# Patient Record
Sex: Female | Born: 1945 | Race: White | Hispanic: No | Marital: Married | Smoking: Never smoker
Health system: Southern US, Community
[De-identification: ages and names within clinical notes are randomized; demographics above are authoritative.]

## PROBLEM LIST (undated history)

## (undated) DIAGNOSIS — I10 Essential (primary) hypertension: Secondary | ICD-10-CM

## (undated) DIAGNOSIS — Z780 Asymptomatic menopausal state: Secondary | ICD-10-CM

## (undated) DIAGNOSIS — I2510 Atherosclerotic heart disease of native coronary artery without angina pectoris: Secondary | ICD-10-CM

## (undated) DIAGNOSIS — R7303 Prediabetes: Secondary | ICD-10-CM

## (undated) DIAGNOSIS — H35039 Hypertensive retinopathy, unspecified eye: Secondary | ICD-10-CM

## (undated) DIAGNOSIS — IMO0002 Reserved for concepts with insufficient information to code with codable children: Secondary | ICD-10-CM

## (undated) DIAGNOSIS — E782 Mixed hyperlipidemia: Secondary | ICD-10-CM

## (undated) DIAGNOSIS — E669 Obesity, unspecified: Secondary | ICD-10-CM

## (undated) DIAGNOSIS — J309 Allergic rhinitis, unspecified: Secondary | ICD-10-CM

## (undated) DIAGNOSIS — M47812 Spondylosis without myelopathy or radiculopathy, cervical region: Secondary | ICD-10-CM

## (undated) DIAGNOSIS — N3946 Mixed incontinence: Secondary | ICD-10-CM

## (undated) DIAGNOSIS — F0781 Postconcussional syndrome: Secondary | ICD-10-CM

## (undated) DIAGNOSIS — M5430 Sciatica, unspecified side: Secondary | ICD-10-CM

## (undated) HISTORY — DX: Obesity, unspecified: E66.9

## (undated) HISTORY — PX: TONSILLECTOMY: SUR1361

## (undated) HISTORY — PX: COLONOSCOPY: SHX174

## (undated) HISTORY — DX: Spondylosis without myelopathy or radiculopathy, cervical region: M47.812

## (undated) HISTORY — PX: BREAST BIOPSY: SHX20

## (undated) HISTORY — PX: BREAST IMPLANT EXCHANGE: SHX6296

## (undated) HISTORY — PX: CARDIAC CATHETERIZATION: SHX172

## (undated) HISTORY — DX: Hypertensive retinopathy, unspecified eye: H35.039

## (undated) HISTORY — DX: Asymptomatic menopausal state: Z78.0

## (undated) HISTORY — DX: Atherosclerotic heart disease of native coronary artery without angina pectoris: I25.10

## (undated) HISTORY — PX: ABDOMINAL HYSTERECTOMY: SHX81

## (undated) HISTORY — PX: TUBAL LIGATION: SHX77

## (undated) HISTORY — DX: Sciatica, unspecified side: M54.30

## (undated) HISTORY — DX: Mixed incontinence: N39.46

## (undated) HISTORY — PX: BREAST ENHANCEMENT SURGERY: SHX7

## (undated) HISTORY — DX: Essential (primary) hypertension: I10

## (undated) HISTORY — DX: Allergic rhinitis, unspecified: J30.9

## (undated) HISTORY — PX: REPLACEMENT TOTAL KNEE: SUR1224

## (undated) HISTORY — DX: Reserved for concepts with insufficient information to code with codable children: IMO0002

## (undated) HISTORY — PX: OTHER SURGICAL HISTORY: SHX169

## (undated) HISTORY — PX: CORONARY ARTERY BYPASS GRAFT: SHX141

## (undated) HISTORY — PX: JOINT REPLACEMENT: SHX530

## (undated) HISTORY — PX: PARTIAL HYSTERECTOMY: SHX80

## (undated) HISTORY — DX: Mixed hyperlipidemia: E78.2

## (undated) HISTORY — DX: Prediabetes: R73.03

---

## 2003-08-23 ENCOUNTER — Ambulatory Visit (HOSPITAL_COMMUNITY): Admission: RE | Admit: 2003-08-23 | Discharge: 2003-08-23 | Payer: Self-pay | Admitting: *Deleted

## 2008-02-20 ENCOUNTER — Inpatient Hospital Stay (HOSPITAL_COMMUNITY): Admission: RE | Admit: 2008-02-20 | Discharge: 2008-02-23 | Payer: Self-pay | Admitting: Orthopedic Surgery

## 2011-01-16 ENCOUNTER — Ambulatory Visit (HOSPITAL_COMMUNITY)
Admission: RE | Admit: 2011-01-16 | Discharge: 2011-01-16 | Disposition: A | Discharge status: Home / Self Care | Payer: BC Managed Care – PPO | Source: Ambulatory Visit | Attending: Cardiology | Admitting: Cardiology

## 2011-01-16 DIAGNOSIS — I2510 Atherosclerotic heart disease of native coronary artery without angina pectoris: Secondary | ICD-10-CM | POA: Insufficient documentation

## 2011-01-16 DIAGNOSIS — E785 Hyperlipidemia, unspecified: Secondary | ICD-10-CM | POA: Insufficient documentation

## 2011-01-16 DIAGNOSIS — I10 Essential (primary) hypertension: Secondary | ICD-10-CM | POA: Insufficient documentation

## 2011-01-16 DIAGNOSIS — I4891 Unspecified atrial fibrillation: Secondary | ICD-10-CM | POA: Insufficient documentation

## 2011-01-18 NOTE — Procedures (Signed)
NAMETINISHA, ETZKORN             ACCOUNT NO.:  [id number]  MEDICAL RECORD NO.:  [id number]           PATIENT TYPE:  I  LOCATION:  6522                         FACILITY:  [facility]  PHYSICIAN:  Jake Bathe, MD      DATE OF BIRTH:  Jan 18, 1946  DATE OF PROCEDURE:  01/16/2011 DATE OF DISCHARGE:  01/16/2011                           CARDIAC CATHETERIZATION   PROCEDURES: 1. Left heart catheterization via the right radial artery approach. 2. Selective coronary angiography. 3. Left ventriculogram.  INDICATIONS:  A 65 year old female with hypertension, hyperlipidemia whose mother and husband are patients of mine, who was evaluated for chest discomfort on January 12, 2011.  It is left sided, noted when walking up hill to [facility] for instance.  She has also been in an exercise class where she has noted left-sided heaviness and discomfort, relieved with rest.  A nuclear stress test was originally ordered, however, this was denied by her insurance company and therefore an exercise treadmill test was performed.  She exercised for 4 minutes and 2 seconds and her blood pressure responded appropriately from 142 to 182 systolic.  She did, however, experience moderate 6/10 chest discomfort, similar to her previous symptoms, developed frequent PVCs in a bigeminal pattern and downsloping ST-segment depression approximately 2 mm in most leads, most notably in III, II, and V5 and V6.  During rest, the symptoms resolved.  She was then taken to the cardiac catheterization lab for further evaluation.  PROCEDURE DETAILS:  Informed consent was obtained.  Risk of stroke, heart attack, death, renal impairment, arterial damage, bleeding were explained to the patient at length.  A 1% lidocaine was used for local anesthesia, 3 mg of Versed and 75 mcg of fentanyl were used for conscious sedation.  A 5-French hydrophilic sheath was inserted in to the right radial artery without difficulty.  A  Judkins right #4 catheter and Judkins left 3.5 catheter were used to selectively cannulate the coronary arteries and multiple views with hand injection of Omnipaque were obtained.  An angled pigtail was used across the left ventricle and the left ventriculogram in the RAO position was performed with power injection of 30 mL of contrast.  Following procedure, the sheath was removed and a Terumo T band was used for hemostasis.  Allen test was normal pre and post procedure.  A 3 mg of verapamil was administered after sheath insertion as well as 4000 units of heparin.  FINDINGS: 1. Left main artery - widely patent, branching into the circumflex as     well as LAD artery. 2. Left anterior descending artery.  At the bifurcation of the first     septal branch, there is a long tubular stenosis of up to 90% and     the vessel remains moderately diseased throughout the mid segment.     After the second diagonal branch, the vessel was without any     significant disease.  There is also a branching diagonal at the     level of this first septal branch which has a 99%/subtotal     occlusion with slow distal filling of this segment. 3. Circumflex  artery.  This artery is diffusely diseased with 1 high     obtuse marginal branch that is quite small in caliber.  The mid AV     groove circ has multiple lesions of 70% identified.  There is one     significant distal obtuse marginal branch which is widely patent. 4. Right coronary artery.  This is the dominant vessel giving rise to     the posterior descending artery.  The posterior descending artery     has approximately 90% ostial stenosis.  There is also 30-40%     proximal RCA stenosis at the site of the first angle of the artery. 5. Left ventriculogram.  Ejection fraction normal.  No evidence of     wall motion abnormality.  EF of 65%.  Left ventricular pressure is     100 with an end-diastolic pressure of 12.  Aortic pressure 100/56     with a mean  of 74 mmHg.  No significant gradient identified.  IMPRESSION: 1. Severe 3-vessel coronary artery disease as described above, most     notably 90% ostial posterior descending coronary artery of the     right coronary artery system, 90% mid left anterior descending     coronary artery, 99% first diagonal, 60-70% diffuse mid circumflex     disease. 2. Normal left ventricular ejection fraction of 65% with no wall     motion abnormality. 3. No evidence of aortic stenosis or significant mitral regurgitation     from left ventriculogram. 4. Newly discovered atrial fibrillation, well rate controlled.  Prior     EKG was normal sinus rhythm.  PLAN:  Findings have been discussed with the patient and referral has been made to [facility] for bypass surgery.  She is currently comfortable without any evidence of chest discomfort or angina and I feel fine letting her be discharged as an outpatient and to have close followup. On Monday, I have scheduled an echocardiogram to be performed at 2:15 p.m.  This will be done in my office.  At this point with her atrial fibrillation, she is a nondiabetic, no prior strokes, and 65 years old. I do feel comfortable at this point to continue with her aspirin therapy.  This will have to be noted, however, peri-procedurally and perhaps maze procedure can be performed concomitantly with bypass.     Jake Bathe, MD     MCS/MEDQ  D:  01/16/2011  T:  01/17/2011  Job:  [id number]  cc:   Joycelyn Rua, M.D.  Electronically Signed by Donato Schultz MD on 01/17/2011 02:29:25 PM

## 2011-01-20 ENCOUNTER — Encounter (INDEPENDENT_AMBULATORY_CARE_PROVIDER_SITE_OTHER): Payer: BC Managed Care – PPO | Admitting: Thoracic Surgery (Cardiothoracic Vascular Surgery)

## 2011-01-20 DIAGNOSIS — I2510 Atherosclerotic heart disease of native coronary artery without angina pectoris: Secondary | ICD-10-CM

## 2011-01-23 ENCOUNTER — Other Ambulatory Visit: Payer: Self-pay | Admitting: Thoracic Surgery (Cardiothoracic Vascular Surgery)

## 2011-01-23 ENCOUNTER — Ambulatory Visit (HOSPITAL_COMMUNITY): Payer: BC Managed Care – PPO | Attending: Thoracic Surgery (Cardiothoracic Vascular Surgery)

## 2011-01-23 ENCOUNTER — Ambulatory Visit (HOSPITAL_COMMUNITY)
Admission: RE | Admit: 2011-01-23 | Discharge: 2011-01-23 | Disposition: A | Discharge status: Home / Self Care | Payer: BC Managed Care – PPO | Source: Ambulatory Visit | Attending: Thoracic Surgery (Cardiothoracic Vascular Surgery) | Admitting: Thoracic Surgery (Cardiothoracic Vascular Surgery)

## 2011-01-23 ENCOUNTER — Encounter (HOSPITAL_COMMUNITY)
Admission: RE | Admit: 2011-01-23 | Discharge: 2011-01-23 | Disposition: A | Discharge status: Home / Self Care | Payer: BC Managed Care – PPO | Source: Ambulatory Visit | Attending: Pediatrics | Admitting: Pediatrics

## 2011-01-23 DIAGNOSIS — Z01818 Encounter for other preprocedural examination: Secondary | ICD-10-CM | POA: Insufficient documentation

## 2011-01-23 DIAGNOSIS — I2510 Atherosclerotic heart disease of native coronary artery without angina pectoris: Secondary | ICD-10-CM

## 2011-01-23 DIAGNOSIS — Z01811 Encounter for preprocedural respiratory examination: Secondary | ICD-10-CM

## 2011-01-23 DIAGNOSIS — I10 Essential (primary) hypertension: Secondary | ICD-10-CM | POA: Insufficient documentation

## 2011-01-23 DIAGNOSIS — Z01810 Encounter for preprocedural cardiovascular examination: Secondary | ICD-10-CM

## 2011-01-26 ENCOUNTER — Inpatient Hospital Stay (HOSPITAL_COMMUNITY): Admission: RE | Admit: 2011-01-26 | Payer: BC Managed Care – PPO | Source: Ambulatory Visit

## 2011-01-27 ENCOUNTER — Inpatient Hospital Stay (HOSPITAL_COMMUNITY)
Admission: RE | Admit: 2011-01-27 | Discharge: 2011-01-31 | DRG: 109 | Disposition: A | Discharge status: Home / Self Care | Payer: BC Managed Care – PPO | Source: Ambulatory Visit | Attending: Thoracic Surgery (Cardiothoracic Vascular Surgery) | Admitting: Thoracic Surgery (Cardiothoracic Vascular Surgery)

## 2011-01-27 ENCOUNTER — Other Ambulatory Visit: Payer: Self-pay | Admitting: Thoracic Surgery (Cardiothoracic Vascular Surgery)

## 2011-01-27 ENCOUNTER — Inpatient Hospital Stay (HOSPITAL_COMMUNITY): Payer: BC Managed Care – PPO

## 2011-01-27 DIAGNOSIS — E119 Type 2 diabetes mellitus without complications: Secondary | ICD-10-CM | POA: Diagnosis present

## 2011-01-27 DIAGNOSIS — I2510 Atherosclerotic heart disease of native coronary artery without angina pectoris: Secondary | ICD-10-CM

## 2011-01-27 DIAGNOSIS — E785 Hyperlipidemia, unspecified: Secondary | ICD-10-CM | POA: Diagnosis present

## 2011-01-27 DIAGNOSIS — Z7982 Long term (current) use of aspirin: Secondary | ICD-10-CM

## 2011-01-27 DIAGNOSIS — E876 Hypokalemia: Secondary | ICD-10-CM | POA: Diagnosis not present

## 2011-01-27 DIAGNOSIS — I10 Essential (primary) hypertension: Secondary | ICD-10-CM | POA: Diagnosis present

## 2011-01-27 DIAGNOSIS — Z96659 Presence of unspecified artificial knee joint: Secondary | ICD-10-CM

## 2011-01-27 DIAGNOSIS — Z7902 Long term (current) use of antithrombotics/antiplatelets: Secondary | ICD-10-CM

## 2011-01-27 DIAGNOSIS — E669 Obesity, unspecified: Secondary | ICD-10-CM | POA: Diagnosis present

## 2011-01-27 DIAGNOSIS — I059 Rheumatic mitral valve disease, unspecified: Secondary | ICD-10-CM | POA: Diagnosis present

## 2011-01-27 DIAGNOSIS — D62 Acute posthemorrhagic anemia: Secondary | ICD-10-CM | POA: Diagnosis not present

## 2011-01-27 LAB — POCT I-STAT 3, ART BLOOD GAS (G3+)
Bicarbonate: 27.9 mEq/L — ABNORMAL HIGH (ref 20.0–24.0)
Bicarbonate: 24.6 mEq/L — ABNORMAL HIGH (ref 20.0–24.0)
Bicarbonate: 26.7 mEq/L — ABNORMAL HIGH (ref 20.0–24.0)
Bicarbonate: 26.3 mEq/L — ABNORMAL HIGH (ref 20.0–24.0)
O2 Saturation: 98 %
Patient temperature: 36.2
TCO2: 29 mmol/L (ref 0–100)
TCO2: 26 mmol/L (ref 0–100)
TCO2: 28 mmol/L (ref 0–100)
TCO2: 28 mmol/L (ref 0–100)
pCO2 arterial: 41.3 mmHg (ref 35.0–45.0)
pCO2 arterial: 43.2 mmHg (ref 35.0–45.0)
pCO2 arterial: 49.7 mmHg — ABNORMAL HIGH (ref 35.0–45.0)
pH, Arterial: 7.438 — ABNORMAL HIGH (ref 7.350–7.400)
pH, Arterial: 7.308 — ABNORMAL LOW (ref 7.350–7.400)
pH, Arterial: 7.331 — ABNORMAL LOW (ref 7.350–7.400)
pO2, Arterial: 87 mmHg (ref 80.0–100.0)
pO2, Arterial: 116 mmHg — ABNORMAL HIGH (ref 80.0–100.0)

## 2011-01-27 LAB — POCT I-STAT 4, (NA,K, GLUC, HGB,HCT)
Glucose, Bld: 133 mg/dL — ABNORMAL HIGH (ref 70–99)
Glucose, Bld: 120 mg/dL — ABNORMAL HIGH (ref 70–99)
Glucose, Bld: 127 mg/dL — ABNORMAL HIGH (ref 70–99)
Glucose, Bld: 116 mg/dL — ABNORMAL HIGH (ref 70–99)
HCT: 37 % (ref 36.0–46.0)
HCT: 28 % — ABNORMAL LOW (ref 36.0–46.0)
HCT: 40 % (ref 36.0–46.0)
Hemoglobin: 9.2 g/dL — ABNORMAL LOW (ref 12.0–15.0)
Hemoglobin: 9.5 g/dL — ABNORMAL LOW (ref 12.0–15.0)
Hemoglobin: 13.6 g/dL (ref 12.0–15.0)
Potassium: 3.2 mEq/L — ABNORMAL LOW (ref 3.5–5.1)
Sodium: 139 mEq/L (ref 135–145)
Sodium: 135 mEq/L (ref 135–145)
Sodium: 139 mEq/L (ref 135–145)

## 2011-01-27 LAB — POCT I-STAT, CHEM 8
BUN: 9 mg/dL (ref 6–23)
Creatinine, Ser: 0.8 mg/dL (ref 0.4–1.2)
Glucose, Bld: 147 mg/dL — ABNORMAL HIGH (ref 70–99)
Hemoglobin: 13.6 g/dL (ref 12.0–15.0)
Potassium: 3.8 mEq/L (ref 3.5–5.1)
Sodium: 141 mEq/L (ref 135–145)

## 2011-01-27 LAB — CBC
HCT: 39.1 % (ref 36.0–46.0)
Hemoglobin: 13.6 g/dL (ref 12.0–15.0)
MCH: 31.5 pg (ref 26.0–34.0)
MCHC: 34.5 g/dL (ref 30.0–36.0)
MCHC: 34.5 g/dL (ref 30.0–36.0)
MCV: 91.2 fL (ref 78.0–100.0)
Platelets: 157 10*3/uL (ref 150–400)
RBC: 4.32 MIL/uL (ref 3.87–5.11)
RDW: 12.5 % (ref 11.5–15.5)
WBC: 20.9 10*3/uL — ABNORMAL HIGH (ref 4.0–10.5)

## 2011-01-27 LAB — PLATELET COUNT: Platelets: 161 10*3/uL (ref 150–400)

## 2011-01-27 LAB — CREATININE, SERUM: GFR calc Af Amer: >60 mL/min (ref >60)

## 2011-01-27 LAB — HEMOGLOBIN AND HEMATOCRIT, BLOOD
HCT: 28.2 % — ABNORMAL LOW (ref 36.0–46.0)
Hemoglobin: 9.8 g/dL — ABNORMAL LOW (ref 12.0–15.0)

## 2011-01-27 LAB — POCT I-STAT GLUCOSE
Glucose, Bld: 127 mg/dL — ABNORMAL HIGH (ref 70–99)
Operator id: 190282

## 2011-01-27 LAB — PROTIME-INR
INR: 1.39 (ref 0.00–1.49)
Prothrombin Time: 17.3 seconds — ABNORMAL HIGH (ref 11.6–15.2)

## 2011-01-27 LAB — APTT: aPTT: 28 seconds (ref 24–37)

## 2011-01-27 LAB — MAGNESIUM: Magnesium: 3 mg/dL — ABNORMAL HIGH (ref 1.5–2.5)

## 2011-01-28 ENCOUNTER — Inpatient Hospital Stay (HOSPITAL_COMMUNITY): Payer: BC Managed Care – PPO

## 2011-01-28 LAB — URINALYSIS, ROUTINE W REFLEX MICROSCOPIC
Bilirubin Urine: NEGATIVE
Ketones, ur: NEGATIVE mg/dL
Nitrite: NEGATIVE
Urobilinogen, UA: 0.2 mg/dL (ref 0.0–1.0)

## 2011-01-28 LAB — POCT I-STAT, CHEM 8
BUN: 11 mg/dL (ref 6–23)
Chloride: 98 mEq/L (ref 96–112)
Creatinine, Ser: 0.9 mg/dL (ref 0.4–1.2)
Glucose, Bld: 161 mg/dL — ABNORMAL HIGH (ref 70–99)
Potassium: 3 mEq/L — ABNORMAL LOW (ref 3.5–5.1)
Sodium: 140 mEq/L (ref 135–145)

## 2011-01-28 LAB — BLOOD GAS, ARTERIAL
Acid-Base Excess: 4.8 mmol/L — ABNORMAL HIGH (ref 0.0–2.0)
O2 Saturation: 98.7 %
TCO2: 30.1 mmol/L (ref 0–100)
pCO2 arterial: 42.7 mmHg (ref 35.0–45.0)
pO2, Arterial: 109 mmHg — ABNORMAL HIGH (ref 80.0–100.0)

## 2011-01-28 LAB — BASIC METABOLIC PANEL
Chloride: 104 mEq/L (ref 96–112)
Creatinine, Ser: 0.77 mg/dL (ref 0.4–1.2)
GFR calc Af Amer: >60 mL/min (ref >60)
Sodium: 142 mEq/L (ref 135–145)

## 2011-01-28 LAB — CREATININE, SERUM
Creatinine, Ser: 0.82 mg/dL (ref 0.4–1.2)
GFR calc Af Amer: >60 mL/min (ref >60)
GFR calc non Af Amer: >60 mL/min (ref >60)

## 2011-01-28 LAB — COMPREHENSIVE METABOLIC PANEL
ALT: 31 U/L (ref 0–35)
AST: 27 U/L (ref 0–37)
Albumin: 4.2 g/dL (ref 3.5–5.2)
Alkaline Phosphatase: 39 U/L (ref 39–117)
Calcium: 10.1 mg/dL (ref 8.4–10.5)
GFR calc Af Amer: >60 mL/min (ref >60)
Potassium: 3.9 mEq/L (ref 3.5–5.1)
Sodium: 142 mEq/L (ref 135–145)
Total Protein: 7.5 g/dL (ref 6.0–8.3)

## 2011-01-28 LAB — GLUCOSE, CAPILLARY
Glucose-Capillary: 106 mg/dL — ABNORMAL HIGH (ref 70–99)
Glucose-Capillary: 115 mg/dL — ABNORMAL HIGH (ref 70–99)
Glucose-Capillary: 123 mg/dL — ABNORMAL HIGH (ref 70–99)
Glucose-Capillary: 123 mg/dL — ABNORMAL HIGH (ref 70–99)
Glucose-Capillary: 127 mg/dL — ABNORMAL HIGH (ref 70–99)
Glucose-Capillary: 151 mg/dL — ABNORMAL HIGH (ref 70–99)
Glucose-Capillary: 164 mg/dL — ABNORMAL HIGH (ref 70–99)
Glucose-Capillary: 99 mg/dL (ref 70–99)
Glucose-Capillary: 146 mg/dL — ABNORMAL HIGH (ref 70–99)
Glucose-Capillary: 145 mg/dL — ABNORMAL HIGH (ref 70–99)
Glucose-Capillary: 179 mg/dL — ABNORMAL HIGH (ref 70–99)

## 2011-01-28 LAB — CBC
HCT: 33 % — ABNORMAL LOW (ref 36.0–46.0)
Hemoglobin: 16.1 g/dL — ABNORMAL HIGH (ref 12.0–15.0)
Hemoglobin: 12.7 g/dL (ref 12.0–15.0)
Hemoglobin: 11.2 g/dL — ABNORMAL LOW (ref 12.0–15.0)
MCH: 31.8 pg (ref 26.0–34.0)
MCH: 31.8 pg (ref 26.0–34.0)
MCH: 31.3 pg (ref 26.0–34.0)
MCHC: 33.9 g/dL (ref 30.0–36.0)
MCV: 92.9 fL (ref 78.0–100.0)
MCV: 92.2 fL (ref 78.0–100.0)
Platelets: 169 10*3/uL (ref 150–400)
RBC: 5.07 MIL/uL (ref 3.87–5.11)
RBC: 3.99 MIL/uL (ref 3.87–5.11)
RBC: 3.58 MIL/uL — ABNORMAL LOW (ref 3.87–5.11)
WBC: 18.7 10*3/uL — ABNORMAL HIGH (ref 4.0–10.5)

## 2011-01-28 LAB — PROTIME-INR: INR: 0.93 (ref 0.00–1.49)

## 2011-01-28 LAB — MAGNESIUM: Magnesium: 2.1 mg/dL (ref 1.5–2.5)

## 2011-01-28 LAB — APTT: aPTT: 30 seconds (ref 24–37)

## 2011-01-29 ENCOUNTER — Inpatient Hospital Stay (HOSPITAL_COMMUNITY): Payer: BC Managed Care – PPO

## 2011-01-29 LAB — BASIC METABOLIC PANEL
CO2: 30 mEq/L (ref 19–32)
Calcium: 8.5 mg/dL (ref 8.4–10.5)
GFR calc Af Amer: >60 mL/min (ref >60)
Glucose, Bld: 105 mg/dL — ABNORMAL HIGH (ref 70–99)
Potassium: 3.3 mEq/L — ABNORMAL LOW (ref 3.5–5.1)
Sodium: 139 mEq/L (ref 135–145)

## 2011-01-29 LAB — CBC
HCT: 31.1 % — ABNORMAL LOW (ref 36.0–46.0)
Hemoglobin: 10.5 g/dL — ABNORMAL LOW (ref 12.0–15.0)
MCHC: 33.8 g/dL (ref 30.0–36.0)
RBC: 3.35 MIL/uL — ABNORMAL LOW (ref 3.87–5.11)
WBC: 14.1 10*3/uL — ABNORMAL HIGH (ref 4.0–10.5)

## 2011-01-29 LAB — GLUCOSE, CAPILLARY
Glucose-Capillary: 120 mg/dL — ABNORMAL HIGH (ref 70–99)
Glucose-Capillary: 145 mg/dL — ABNORMAL HIGH (ref 70–99)
Glucose-Capillary: 140 mg/dL — ABNORMAL HIGH (ref 70–99)
Glucose-Capillary: 126 mg/dL — ABNORMAL HIGH (ref 70–99)

## 2011-01-30 ENCOUNTER — Inpatient Hospital Stay (HOSPITAL_COMMUNITY): Payer: BC Managed Care – PPO

## 2011-01-30 LAB — GLUCOSE, CAPILLARY
Glucose-Capillary: 110 mg/dL — ABNORMAL HIGH (ref 70–99)
Glucose-Capillary: 116 mg/dL — ABNORMAL HIGH (ref 70–99)

## 2011-01-30 LAB — CBC
HCT: 32.4 % — ABNORMAL LOW (ref 36.0–46.0)
MCHC: 33 g/dL (ref 30.0–36.0)
MCV: 93.6 fL (ref 78.0–100.0)
Platelets: 137 10*3/uL — ABNORMAL LOW (ref 150–400)
RDW: 13.3 % (ref 11.5–15.5)

## 2011-01-30 LAB — BASIC METABOLIC PANEL
BUN: 15 mg/dL (ref 6–23)
Chloride: 98 mEq/L (ref 96–112)
Creatinine, Ser: 0.89 mg/dL (ref 0.4–1.2)
Glucose, Bld: 93 mg/dL (ref 70–99)
Potassium: 3.6 mEq/L (ref 3.5–5.1)

## 2011-01-31 LAB — GLUCOSE, CAPILLARY

## 2011-02-06 ENCOUNTER — Encounter (INDEPENDENT_AMBULATORY_CARE_PROVIDER_SITE_OTHER): Payer: Self-pay

## 2011-02-06 DIAGNOSIS — I2510 Atherosclerotic heart disease of native coronary artery without angina pectoris: Secondary | ICD-10-CM

## 2011-02-06 DIAGNOSIS — I059 Rheumatic mitral valve disease, unspecified: Secondary | ICD-10-CM

## 2011-02-10 NOTE — H&P (Addendum)
[name], [name]             ACCOUNT NO.:  [id number]  MEDICAL RECORD NO.:  [id number]           PATIENT TYPE:  I  LOCATION:  6522                         FACILITY:  [facility]  PHYSICIAN:  Salvatore Decent. [name], M.D.DATE OF BIRTH:  September 25, 1946  DATE OF ADMISSION:  01/16/2011 DATE OF DISCHARGE:  01/16/2011                             HISTORY & PHYSICAL   REASON FOR CONSULTATION:  Three-vessel disease with exertional angina.  HISTORY OF PRESENT ILLNESS:  [name] is a 65 year old woman with a history of hypertension and hyperlipidemia, but no prior cardiac history.  About a month ago, she was taking an exercise class and noticed onset of chest pain.  She thought at first it might be a pulled muscle, it got better with rest.  However, then shortly thereafter, she was visiting her mother in the hospital, while she was long in walking up an incline, she experienced the same discomfort.  Since then, she has continued to have exertional discomfort relieved with rest.  She is not aware of any nausea, diaphoresis, or shortness of breath in association with this.  She also has not had any syncope or presyncopal spells.  She saw [name] and EKG showed some nonspecific ST changes and some PVCs. An exercise treadmill test showed ST depression 2 mm associated with chest discomfort.  She was referred to [name] and underwent cardiac catheterization which revealed severe three-vessel coronary disease with preserved left ventricular function.  She subsequently had an echocardiogram, the official report of which is still pending; but according to [name] showed mild-to-moderate mitral regurgitation. Of note, she also had an episode of atrial fibrillation which was short- lived and apparently was noted around the time of her stress test.  She denies any rest or nocturnal symptoms.  She does not have any previous history of atrial fibrillation, but says she will feel her heart flutter for  a few seconds every few months.  PAST MEDICAL HISTORY:  Significant for hypertension, mixed hyperlipidemia, diabetes, heart murmur, cervical spondylosis with bulging disks, obesity.  PAST SURGICAL HISTORY:  Extensive as noted in the patient's chart.  Of note, she has had bilateral knee surgeries including a right knee replacement, also bilateral breast implants.  MEDICATIONS:  Atenolol-Chlorthal 50/25 one tablet daily, Klor-Con 20 mEq daily, simvastatin 20 mg nightly, aspirin 81 mg daily, vitamin C, fish oil, calcium with vitamin D, chromium, and Biotin.  She has allergies to PENICILLIN causes a rash; SULFA causes a rash, NORVASC causes edema, and LISINOPRIL causes a cough.  She also has a LATEX allergy which causes a rash.  FAMILY HISTORY:  Significant for father dying at age 35 from heart disease.  Mother had a stent at age 32.  SOCIAL HISTORY:  She is married.  She works as a [employer] operational officer.  She has two children.  She does not smoke, occasionally uses alcohol.  REVIEW OF SYSTEMS:  Heart murmur, atrial fibrillation, chest pain as previously noted, although she occasionally has a rash, which she will use clotrimazole or betamethasone cream.  All other systems are negative.  PHYSICAL EXAMINATION:  GENERAL:  [name] is a well-appearing 65 year old  female in no acute distress. VITAL SIGNS:  Blood pressure is 117/78, pulse 68, respirations 14, oxygen saturation is 95% on room air. NEUROLOGICALLY:  She is alert, oriented x3 with no focal deficits.  She is quite anxious. HEENT:  Unremarkable. NECK:  Supple without thyromegaly, adenopathy, or bruits. CARDIAC:  Regular rate and rhythm.  Normal S1, S2.  There is a 2/6 systolic murmur. LUNGS:  Clear. ABDOMEN:  Soft, nontender. EXTREMITIES:  Without clubbing, cyanosis, or edema.  She has 2+ pulses throughout.  LABORATORY DATA:  Cardiac catheterization and echocardiogram as noted.  IMPRESSION:  [name] is a 65 year old woman  with exertional chest pain.  She has been found to have severe three-vessel coronary disease including proximal LAD disease, coronary bypass grafting as indicated for survival benefit and relief of symptoms in this setting.  I discussed in detail with her the nature of the operation including incisions to be used, expected hospital stay, overall recovery.  We did discuss relative pros and cons of on versus off-pump grafting.  I do think given her need for 4 grafts and relatively small target vessels that on-pump grafting would be preferable in her case.  I also discussed with her the possible need for mitral valve repair as well as a possibility of doing a maze procedure.  She has only had one episode of atrial fibrillation, so I would not subject her to a maze unless she needed something done with her mitral valve.  We will plan to assess the mitral valve intraoperatively with a transesophageal echocardiogram.  If that shows pathology which warrants opening the atrium to repair the valve, then we would go ahead and proceed with the maze.  If not, then given that she has only had one transient episode, we would not do the maze at that point.  She understands the risks of surgery include but not limited to death, stroke, MI, DVT, PE, bleeding, possible need for transfusion, infection as well as other organ system, dysfunction including respiratory, renal, hepatic, or GI complications.  She understands and accepts these risks and agrees to proceed.  All the patient's questions were answered.  If she has any further questions, she will plan to call.  We are going to proceed with surgery on Tuesday, February 21.     Salvatore Decent [name], M.D.     SCH/MEDQ  D:  01/20/2011  T:  01/21/2011  Job:  [id number]  cc:   [name], M.D.  Electronically Signed by [name] M.D. on 02/10/2011 12:21:09 PM Electronically Signed by [name] M.D. on 02/10/2011 12:29:14  PM Electronically Signed by [name] M.D. on 02/10/2011 12:46:29 PM Electronically Signed by [name] M.D. on 02/10/2011 01:04:31 PM Electronically Signed by [name] M.D. on 02/10/2011 01:24:48 PM Electronically Signed by [name] M.D. on 02/10/2011 01:46:17 PM Electronically Signed by [name] M.D. on 02/10/2011 02:06:05 PM Electronically Signed by [name] M.D. on 02/10/2011 02:26:30 PM Electronically Signed by [name] M.D. on 02/10/2011 02:49:26 PM Electronically Signed by [name] M.D. on 02/10/2011 03:13:31 PM Electronically Signed by [name] M.D. on 02/10/2011 03:38:10 PM Electronically Signed by [name] M.D. on 02/10/2011 04:13:50 PM Electronically Signed by [name] M.D. on 02/10/2011 04:45:08 PM Electronically Signed by [name] M.D. on 02/10/2011 05:18:23 PM Electronically Signed by [name] M.D. on 02/10/2011 05:18:23 PM Electronically Signed by [name] M.D. on 02/10/2011 07:32:31 PM

## 2011-02-10 NOTE — Discharge Summary (Addendum)
[name], [name]             ACCOUNT NO.:  [id number]  MEDICAL RECORD NO.:  [id number]           PATIENT TYPE:  LOCATION:                                 FACILITY:  PHYSICIAN:  Salvatore Decent. [name], M.D.DATE OF BIRTH:  1946-06-22  DATE OF ADMISSION: DATE OF DISCHARGE:                              DISCHARGE SUMMARY   HISTORY:  The patient is a 65 year old female with a history of hypertension and hyperlipidemia, but no prior cardiac history. Approximately 1 month prior to presentation, she was taking an exercise class and noticed an onset of chest pain.  She thought at first she might have pulled a muscle, but it got better with rest.  However, shortly after that she was visiting her mother in the hospital and while walking up an incline she experiences the same discomfort.  Since then she has had continued exertional discomfort relieved with rest.  She is not aware of any nausea.  She did not have any diaphoresis or shortness of breath in association with this.  She did not have any syncope or presyncopal spells.  She saw Dr. [name] and an EKG showed some nonspecific ST changes with some PVCs.  An exercise treadmill test showed ST depression 2 mm associated with chest discomfort.  She was referred to Dr. [name] and subsequently underwent cardiac catheterization which revealed severe three-vessel coronary artery disease with preserved left ventricular function.  She subsequently had an echocardiogram and this showed mild to moderate mitral regurgitation. Of note, she also had an episode of atrial fibrillation which was short- lived and apparently noted at the time of her stress test.  She denies any rest or nocturnal symptoms.  She does not have any previous history of atrial fibrillation, but she will feel her heart flutter for a few seconds every few months.  Due to this evaluation, cardiac surgical consultation was felt to be warranted and obtained with  [name], MD, who evaluated the patient and her studies and agreed with recommendations to proceed with surgical revascularization.  She was admitted this hospitalization for the procedure.  PAST MEDICAL HISTORY:  Significant for 1. Hypertension. 2. Mixed hyperlipidemia. 3. Diabetes mellitus. 4. History of heart murmur. 5. Cervical spondylosis with bulging disks. 6. Obesity.  PAST SURGICAL HISTORY:  Extensive including bilateral knee surgeries including right knee replacement.  She also had bilateral breast implants.  MEDICATIONS:  Prior to admission include the following 1. Atenolol/chlorthalidone 50/25 mg 1 tablet daily. 2. Klor-Con 20 mEq daily. 3. Simvastatin 20 mg nightly. 4. Aspirin 81 mg daily. 5. Vitamin C supplement daily. 6. Fish oil supplement daily. 7. Calcium with vitamin D supplement daily. 8. Chromium daily. 9. Biotin daily.  ALLERGIES:  Include PENICILLIN, which causes a rash.  SULFA, which causes a rash.  NORVASC, which causes edema and LISINOPRIL causes cough. She also has a LATEX allergy, which causes a rash.  FAMILY HISTORY:  Significant for her father died at age 59 from heart disease.  Mother had a stent at age 10.  SOCIAL HISTORY:  She is married.  She works as a [employer] Services operational officer.  She has 2 children.  She does  not smoke.  She uses occasional alcohol.  REVIEW OF SYMPTOMS:  Please see the dictated history and physical done at time of admission.  PHYSICAL EXAM:  Please see the dictated history and physical done at time of admission.  HOSPITAL COURSE:  The patient was admitted electively and taken to the operating room where she underwent coronary artery bypass grafting x4, the following grafts were placed. 1. A left internal mammary artery to the LAD, a saphenous vein graft     to the first diagonal with endarterectomy, a saphenous vein graft     to the obtuse marginal and a saphenous vein graft to the posterior     descending.  Now,  intraoperative transesophageal echocardiogram     revealed only 1+ mitral regurgitation, increased to 2+ with     increase in systolic pressure to the 180 range.  There was no     prolapse or other intrinsic valvular pathology and it was not felt     to require repair or replacement.  The patient was taken to the     surgical Intensive Care Unit in stable condition.  POSTOPERATIVE HOSPITAL COURSE:  The patient has overall progressed quite nicely.  She has maintained hemodynamic stability and all routine lines, monitors and drainage devices were discontinued in the standard fashion. She has had postoperative atrial fibrillation and was subsequently, chemically cardioverted and back to normal sinus rhythm with Lopressor and amiodarone.  She has required a mild postoperative diuresis and is responding well to this.  She is tolerating a gradual increase in activities using standard protocols.  She has a mild acute blood loss anemia with hemoglobin/hematocrit dated January 30, 2011, of 10.7 and 32.4.  Electrolytes, BUN and creatinine are within normal limits.  The incisions are healing well without evidence of infection.  Capillary blood glucose under adequate control.  Oxygen is being weaned and currently she has good saturations on 2 liters nasal cannula. Tentatively, it is felt that she will be stable for discharge in the next 1-2 days pending further course.  At the time of this dictation, her medications are as follows: 1. Amiodarone 400 mg twice daily for 7 days and then 400 mg daily. 2. Atenolol 50 mg daily. 3. Plavix 75 mg daily. 4. Lasix 40 mg daily for 7 days. 5. Oxycodone 5 mg IR tablet 1-2 every 4-6 hours as needed for pain. 6. Protonix 40 mg daily. 7. Potassium chloride 20 mEq daily. 8. Aspirin 81 mg daily. 9. Calcium carbonate with vitamin D 1 tablet daily. 10.Chromium over the counter supplement 1 tablet daily. 11.Fish oil daily. 12.Lutein daily. 13.Multivitamin  daily. 14.Simvastatin 20 mg daily. 15.Vitamin B 1 tablet daily. 16.Vitamin C 1 tablet daily.  INSTRUCTIONS:  This patient received written instructions in regarding medications, activity, diet, wound care and followup.  FOLLOWUP:  Including Dr. Dorris [name] in 3 weeks postdischarge.  Dr. [name] in 2 weeks postdischarge.  FINAL DIAGNOSES:  Include the following 1. Severe multivessel coronary artery disease, now status post     surgical revascularization as outlined above. Other diagnoses include 1. Mild postoperative anemia. 2. Postoperative atrial fibrillation. 3. History of hypertension. 4. History of hyperlipidemia. 5. History of diabetes. 6. History of mild mitral regurgitation. 7. History of cervical spondylosis with bulging disks. 8. History of obesity.     Rowe Clack, P.A.-C.   ______________________________ Salvatore Decent [name], M.D.    Sherryll Burger  D:  01/30/2011  T:  01/30/2011  Job:  [id number]  cc:  Joycelyn Rua, M.D. Jake Bathe, MD  Electronically Signed by Deniece Portela GOLD P.A.-C. on 02/03/2011 10:41:03 AM Electronically Signed by [name] M.D. on 02/10/2011 12:21:21 PM Electronically Signed by [name] M.D. on 02/10/2011 12:50:36 PM Electronically Signed by [name] M.D. on 02/10/2011 01:08:36 PM Electronically Signed by [name] M.D. on 02/10/2011 01:29:43 PM Electronically Signed by [name] M.D. on 02/10/2011 01:50:45 PM Electronically Signed by [name] M.D. on 02/10/2011 02:10:31 PM Electronically Signed by [name] M.D. on 02/10/2011 02:31:15 PM Electronically Signed by [name] M.D. on 02/10/2011 02:54:32 PM Electronically Signed by [name] M.D. on 02/10/2011 03:19:20 PM Electronically Signed by [name] M.D. on 02/10/2011 03:43:37 PM Electronically Signed by [name] M.D. on 02/10/2011 04:20:40 PM Electronically Signed by [name] M.D. on 02/10/2011 04:51:55 PM Electronically Signed by [name] M.D. on 02/10/2011 07:36:58 PM

## 2011-02-10 NOTE — Op Note (Addendum)
[name], [name]             ACCOUNT NO.:  [id number]  MEDICAL RECORD NO.:  [id number]           PATIENT TYPE:  I  LOCATION:  2315                         FACILITY:  [facility]  PHYSICIAN:  Salvatore Decent. [name], M.D.DATE OF BIRTH:  1946-11-04  DATE OF PROCEDURE:  01/27/2011 DATE OF DISCHARGE:                              OPERATIVE REPORT   PREOPERATIVE DIAGNOSES: 1. Severe 3-vessel coronary artery disease. 2. Mild-to-moderate mitral regurgitation.  POSTOPERATIVE DIAGNOSES: 1. Severe 3-vessel coronary artery disease. 2. Mild-to-moderate mitral regurgitation.  PROCEDURES: 1. Median sternotomy. 2. Extracorporeal circulation. 3. Coronary artery bypass grafting x4 (left internal mammary artery to     left anterior descending coronary artery, saphenous vein graft to     first diagonal with endarterectomy, saphenous vein graft to obtuse     marginal, saphenous vein graft to posterior descending). 4. Endoscopic vein harvest, right leg.  SURGEON:  Salvatore Decent. [name], MD  ASSISTANT:  Doree Fudge, PA  ANESTHESIA:  General.  FINDINGS:  Diagonal completely occluded large plaque.  Endarterectomy required.  Remaining targets relatively small, but good-quality conduits.  Transesophageal echocardiography revealed 1+ mitral regurgitation, increased to 2+ with increased in systolic pressure to the 180 range. No prolapse or other intrinsic valvular pathology.  CLINICAL NOTE:  [name] is a 65 year old woman with a history of hypertension, hyperlipidemia, and about a 75-month history of chest tightness with exertion.  She had a positive stress test and a catheterization was found to have severe 3-vessel coronary artery disease.  She had an echocardiogram which revealed mild-to-moderate mitral regurgitation and she did have 1 episode of atrial fibrillation which was transient and did not require medication.  The patient was advised to undergo coronary artery bypass grafting  with intraoperative assessment of the mitral valve and if necessary to repair the mitral valve, addition of the maze procedure, however, if no procedure was required on the mitral valve, maze procedure would not be performed. The indications, risks, benefits, and alternatives as well as intraoperative decision making were discussed in detail with the patient.  She understood and accepted risks and agreed to proceed.  OPERATIVE NOTE:  [name] was brought to the preop holding area on January 27, 2011, where the Anesthesia Service placed a Swan-Ganz catheter and arterial blood pressure monitoring line.  Of note, there was no V-wave on the S1 tracing.  Intravenous antibiotics were administered.  The patient was taken to the operating room, anesthetized and intubated.  A Foley catheter was placed.  Transesophageal echocardiography was performed by Dr. Arta Bruce.  Please refer to his separately dictated note for details.  The mitral valve was assessed. There was 1+ MR with increasing the systolic pressure into the 180 range and if worse, there was 2+ mitral regurgitation, central jet with no prolapsing leaflets.  Incision was made not that the mitral regurgitation was not severe enough to warrant surgical intervention. The chest, abdomen, and legs were prepped and draped in the usual fashion.  Median sternotomy was performed and an incision was made in the medial aspect of the right leg at the level of the knee.  The greater saphenous vein was identified  there and was harvested endoscopically.  A 2000 units of heparin was administered during the vessel harvest.  Simultaneously with this, the left internal mammary artery was harvested using standard technique.  Both were good-quality vessels.  After harvesting the conduits, remainder of full heparin dose was given.  The pericardium was opened to the ascending aorta, was cannulated via concentric 2-0 Ethibond pledgeted pursestring sutures.   A dual-stage venous cannula was placed via pursestring suture in the right atrial appendage.  Cardiopulmonary bypass was instituted and the patient was cooled to 32 degrees of Celsius.  The coronary arteries were inspected and anastomotic sites were chosen.  The conduits were inspected and cut to length.  A foam pad was placed in the pericardium to insulate the heart and protect the left phrenic nerve.  A temperature probe was placed in myocardial septum and a cardioplegic cannula was placed in the ascending aorta.  The aorta was crossclamped.  The left ventricle was emptied via aortic root vent.  Cardiac arrest was achieved with combination of cold, antegrade blood cardioplegia, and topical iced saline.  After achieving a complete diastolic arrest and adequate myocardial septal cooling, 750 mL of cardioplegia was administered in the septum, cooled to 9 degrees Celsius, following distal anastomoses were performed.  First, a reversed saphenous vein graft was placed end-to-side to the posterior descending branch of the right coronary.  She also had 90% ostial stenosis and also had a plaque in its proximal portion, was grafted in its midportion.  A 1.5-mm probe did pass back to the ostium of the vessel.  The right coronary had a less than 50% proximal stenosis.  The vein then was anastomosed end-to-side to the posterior descending with a running 7-0 Prolene suture.  At the completion of each anastomosis, a probe was passed proximally and distally to ensure patency.  Cardioplegia was then administered at completion of each vein graft.  There was good flow and good hemostasis.  Next, a reversed saphenous vein graft was placed end-to-side to the obtuse marginal branch of the circumflex.  This was the posterior lateral branch.  It was a 1.5-mm good-quality target vessel.  The vein was a good-quality, was anastomosed end-to-side with a running 7-0 Prolene suture.  Again, there was good flow  and good hemostasis of this anastomosis as well.  Next, the first diagonal was assessed.  This vessel had a large plaque within it, consistent with area where it was totally occluded on the angiogram.  An arteriotomy was made over the proximal portion of this plaque and an endarterectomy was performed removing the plaque in 1 piece.  A 1-mm probe then passed distally to the bifurcation of the vessel.  The saphenous vein was then anastomosed end-to-side with a running 7-0 Prolene suture.  At the completion of the graft, there was excellent flow through the graft.  Cardioplegia was administered.  There was good flow and good hemostasis.  Next, the left internal mammary artery was brought through a window in the pericardium.  The distal end was beveled.  It was then anastomosed end-to-side to the mid LAD.  The LAD was grafted just proximal to where it became an intramyocardial vessel near the takeoff a small second diagonal branch.  There was a 1.5-mm good-quality target in this area. The mammary was a 1.5-mm good-quality conduit.  The anastomosis was performed end-to-side with a running 8-0 Prolene suture.  At the completion of the mammary to LAD anastomosis, the bulldog clamps were briefly removed to inspect  for hemostasis.  Immediate rapid septal rewarming was noted.  The bulldog clamp was replaced.  The mammary pedicle was tacked to the epicardial surface of the heart with 6-0 Prolene sutures.  Additional cardioplegia was administered.  The vein grafts were cut to length.  The proximal vein graft anastomoses then were performed to 4.5- mm punch aortotomies with running 6-0 Prolene sutures.  At the completion of final proximal anastomosis, the patient was placed in Trendelenburg position.  Lidocaine was administered.  The aortic root was de-aired, bulldog clamp was again removed from the left mammary artery.  After de-airing the aortic root, theaortic crossclamp was removed.  Total  crossclamp time was 73 minutes.  The patient spontaneously resumed sinus rhythm, did not require defibrillation.  While rewarming was completed, all proximal and distal anastomoses were inspected for hemostasis.  Epicardial pacing wires were placed on the right ventricle and right atrium.  When the patient had rewarmed to a core temperature of 37 degrees Celsius, she was weaned from cardiopulmonary bypass on the first attempt.  The total bypass time was 105 minutes.  The initial cardiac index was 1.5 liters/minute/meter squared with volume administration within a few minutes, the index greater than 2 liters/minute/meter squared.  The patient then remained hemodynamically stable throughout post bypass.  Postbypass transesophageal echocardiography revealed no significant change in the preop findings.  A test dose of protamine was administered and was well tolerated.  The atrial and aortic cannulae were removed.  The remainder of the protamine was administered without incident.  Chest was irrigated with 1 liter of saline.  Hemostasis was achieved.  The pericardium was reapproximated over the aorta and base of the heart with interrupted 3-0 silk sutures. A left pleural and single mediastinal chest tubes were placed through separate subcostal incisions.  The sternum was closed with combination of single and double heavy gauge stainless steel wires.  The pectoralis fascia, subcutaneous tissue, and skin were closed in standard fashion. All sponge, needle, and instrument counts were correct at the end of the procedure.  The patient was taken from the operating room to the surgical intensive care unit in good condition.     Salvatore Decent [name], M.D.     SCH/MEDQ  D:  01/27/2011  T:  01/28/2011  Job:  [id number]  cc:   Jake Bathe, MD Joycelyn Rua, M.D.  Electronically Signed by [name] M.D. on 02/10/2011 12:22:25 PM Electronically Signed by [name] M.D. on  02/10/2011 12:46:39 PM Electronically Signed by [name] M.D. on 02/10/2011 01:04:38 PM Electronically Signed by [name] M.D. on 02/10/2011 01:24:59 PM Electronically Signed by [name] M.D. on 02/10/2011 01:46:25 PM Electronically Signed by [name] M.D. on 02/10/2011 02:06:12 PM Electronically Signed by [name] M.D. on 02/10/2011 02:26:38 PM Electronically Signed by [name] M.D. on 02/10/2011 02:49:34 PM Electronically Signed by [name] M.D. on 02/10/2011 03:13:38 PM Electronically Signed by [name] M.D. on 02/10/2011 03:38:19 PM Electronically Signed by [name] M.D. on 02/10/2011 04:14:08 PM Electronically Signed by [name] M.D. on 02/10/2011 04:45:23 PM Electronically Signed by [name] M.D. on 02/10/2011 05:18:43 PM Electronically Signed by [name] M.D. on 02/10/2011 05:18:43 PM Electronically Signed by [name] M.D. on 02/10/2011 07:32:31 PM

## 2011-02-20 ENCOUNTER — Other Ambulatory Visit: Payer: Self-pay | Admitting: Thoracic Surgery (Cardiothoracic Vascular Surgery)

## 2011-02-20 DIAGNOSIS — I2510 Atherosclerotic heart disease of native coronary artery without angina pectoris: Secondary | ICD-10-CM

## 2011-02-23 ENCOUNTER — Encounter (INDEPENDENT_AMBULATORY_CARE_PROVIDER_SITE_OTHER): Payer: Self-pay | Admitting: Thoracic Surgery (Cardiothoracic Vascular Surgery)

## 2011-02-23 ENCOUNTER — Ambulatory Visit
Admission: RE | Admit: 2011-02-23 | Discharge: 2011-02-23 | Disposition: A | Discharge status: Home / Self Care | Payer: BC Managed Care – PPO | Source: Ambulatory Visit | Attending: Thoracic Surgery (Cardiothoracic Vascular Surgery) | Admitting: Thoracic Surgery (Cardiothoracic Vascular Surgery)

## 2011-02-23 DIAGNOSIS — I2510 Atherosclerotic heart disease of native coronary artery without angina pectoris: Secondary | ICD-10-CM

## 2011-02-24 NOTE — Assessment & Plan Note (Signed)
OFFICE VISIT  TASHONDA, PINKUS A DOB:  11-Nov-1946                                        February 23, 2011 CHART #:  [id number]  Ms. Rocque is a 65 year old woman who had coronary bypass grafting x4 on February 21.  She also had mild-to-moderate mitral regurgitation, but in the OR, it was really more like 1+, maybe 2+ at the worst, and she did not require any intervention to her mitral valve.  Her diagonal was heavily diseased and required endarterectomy, so she was discharged on Plavix.  She had some atrial fibrillation postoperatively that was controlled with beta blockers and amiodarone, and she went home in sinus rhythm.  She states that she has been doing well.  She has been walking. She was able, last night, for the first time, to sleep lying flat on her back.  She has been driving without any issues.  She has not taken any narcotics since she left the hospital.  She has been using Tylenol for pain, and she has noticed a slight cough.  Her current medications are aspirin 81 mg daily, simvastatin 20 mg daily, atenolol 50 mg daily, Plavix 75 mg daily, Protonix 40 mg daily, amiodarone 200 mg daily, furosemide 20 mg daily, fish oil 1200 mg daily, Biotin 500 mcg daily, and potassium 20 mEq daily.  On physical examination, Ms. Kozma is a well-appearing 65 year old woman in no acute distress.  Her blood pressure is 134/75, pulse 64, respirations are 16, and oxygen saturation is 96% on room air.  She has diminished breath sounds in the left base, otherwise clear.  Cardiac exam has regular rate and rhythm.  Normal S1 and S2.  No murmurs, rubs, or gallops.  Sternal incision is clean, dry, and intact.  Sternum is stable.  Chest tube sites are healing well.  Leg incisions are healing well.  There is no peripheral edema.  Chest x-ray shows a small left pleural effusion.  IMPRESSION:  Ms. Offenberger is a 65 year old woman status post coronary artery bypass grafting.   She is doing well at this point in time.  We did send her home on Plavix because she had an endarterectomy of her diagonal; she does not need to be on that any longer, she is anxious to get off it.  I want to keep her on amiodarone for two additional weeks once she is 6 weeks out from surgery.  Her risk for atrial fibrillation is back to baseline.  She has been driving, that has been going well. She will use caution in terms of increasing her driving activities.  She is not to lift any objects greater than 10 pounds for another 2 weeks. She has been working out of her home office 2-4 hours a day.  I told her to be cautious and be very gradual about building up on that.  I wanted to get her recovered before she is back to full blast on the working. Pain control is excellent.  She is not requiring any narcotics.  She is going to start cardiac rehab on Thursday.  The only issue currently is the pleural effusion.  It is a small-to-moderate effusion, it is not large enough to require thoracentesis, but I do want to give her a steroid taper to see if we can get that to resolve.  I will plan on seeing her  back in 2 weeks with a repeat chest x-ray to make sure that it is getting better.  Salvatore Decent [name], M.D. Electronically Signed  SCH/MEDQ  D:  02/23/2011  T:  02/24/2011  Job:  [id number]  cc:   Jake Bathe, MD Joycelyn Rua, M.D.

## 2011-03-02 ENCOUNTER — Encounter (HOSPITAL_COMMUNITY): Payer: BC Managed Care – PPO | Attending: Cardiology

## 2011-03-02 ENCOUNTER — Other Ambulatory Visit: Payer: Self-pay | Admitting: Cardiology

## 2011-03-02 DIAGNOSIS — Z7982 Long term (current) use of aspirin: Secondary | ICD-10-CM | POA: Insufficient documentation

## 2011-03-02 DIAGNOSIS — Z96659 Presence of unspecified artificial knee joint: Secondary | ICD-10-CM | POA: Insufficient documentation

## 2011-03-02 DIAGNOSIS — I059 Rheumatic mitral valve disease, unspecified: Secondary | ICD-10-CM | POA: Insufficient documentation

## 2011-03-02 DIAGNOSIS — I2510 Atherosclerotic heart disease of native coronary artery without angina pectoris: Secondary | ICD-10-CM | POA: Insufficient documentation

## 2011-03-02 DIAGNOSIS — I10 Essential (primary) hypertension: Secondary | ICD-10-CM | POA: Insufficient documentation

## 2011-03-02 DIAGNOSIS — Z7902 Long term (current) use of antithrombotics/antiplatelets: Secondary | ICD-10-CM | POA: Insufficient documentation

## 2011-03-02 DIAGNOSIS — E119 Type 2 diabetes mellitus without complications: Secondary | ICD-10-CM | POA: Insufficient documentation

## 2011-03-02 DIAGNOSIS — E785 Hyperlipidemia, unspecified: Secondary | ICD-10-CM | POA: Insufficient documentation

## 2011-03-02 DIAGNOSIS — Z5189 Encounter for other specified aftercare: Secondary | ICD-10-CM | POA: Insufficient documentation

## 2011-03-02 DIAGNOSIS — E669 Obesity, unspecified: Secondary | ICD-10-CM | POA: Insufficient documentation

## 2011-03-02 DIAGNOSIS — Z951 Presence of aortocoronary bypass graft: Secondary | ICD-10-CM | POA: Insufficient documentation

## 2011-03-04 ENCOUNTER — Other Ambulatory Visit: Payer: Self-pay | Admitting: Cardiology

## 2011-03-04 ENCOUNTER — Encounter (HOSPITAL_COMMUNITY): Payer: BC Managed Care – PPO

## 2011-03-06 ENCOUNTER — Encounter (HOSPITAL_COMMUNITY): Payer: BC Managed Care – PPO

## 2011-03-06 ENCOUNTER — Other Ambulatory Visit: Payer: Self-pay | Admitting: Thoracic Surgery (Cardiothoracic Vascular Surgery)

## 2011-03-06 DIAGNOSIS — I2510 Atherosclerotic heart disease of native coronary artery without angina pectoris: Secondary | ICD-10-CM

## 2011-03-09 ENCOUNTER — Encounter (HOSPITAL_COMMUNITY): Payer: BC Managed Care – PPO

## 2011-03-09 ENCOUNTER — Encounter (INDEPENDENT_AMBULATORY_CARE_PROVIDER_SITE_OTHER): Payer: Self-pay | Admitting: Thoracic Surgery (Cardiothoracic Vascular Surgery)

## 2011-03-09 ENCOUNTER — Ambulatory Visit
Admission: RE | Admit: 2011-03-09 | Discharge: 2011-03-09 | Disposition: A | Discharge status: Home / Self Care | Payer: BC Managed Care – PPO | Source: Ambulatory Visit | Attending: Thoracic Surgery (Cardiothoracic Vascular Surgery) | Admitting: Thoracic Surgery (Cardiothoracic Vascular Surgery)

## 2011-03-09 DIAGNOSIS — I2510 Atherosclerotic heart disease of native coronary artery without angina pectoris: Secondary | ICD-10-CM

## 2011-03-10 NOTE — Assessment & Plan Note (Signed)
OFFICE VISIT  [name], PREST A DOB:  [birth year]                                        March 09, 2011 CHART #:  [id number]  The patient is a 65 year old old woman who had coronary bypass grafting x4 on January 27, 2011.  She had some atrial fibrillation postoperatively, was history of amiodarone, but went home in sinus rhythm.  She was seen in the office on February 23, 2011, at which time she was doing well overall, but still was in early recovery phase.  She had a moderate left pleural effusion that was treated with diuretics and steroids.  She now returns for followup.  She says that she has been sleeping on her back without problems.  Her cough has gotten much better.  She says she still gasp for air once in a while.  She did that prior to her surgery as well.  She states she has more energy now than she had prior to surgery and overall feels well.  She is not having to take any pain medication.  PHYSICAL EXAMINATION:  GENERAL:  The patient is a well-appearing 65 year old woman in no acute distress.  LUNGS:  Very slightly diminished breath sounds at the left base, otherwise clear.  Chest x-ray shows improvement in the small left pleural effusion.  There is only a tiny residual amount of fluid remaining.  IMPRESSION:  The patient is now doing very well at this point in time. She is about 6 weeks out of surgery.  She wants to stop her amiodarone. I think she is fine to do that.  At this point, she does have some risk going back into atrial fibrillation, but is not going to decrease appreciably from this point onwards, so she is going to stop the amiodarone.  Her activities are unrestricted, but I did advice her to gradually build into her activities overtime rather than try and do too much once she started cardiac rehab and that is going well.  She has a appointment with Dr. [name] today.  He will continue to follow her from a medical standpoint.  I  would be happy to see her back anytime if I can be of any further assistance with her care.  Salvatore Decent [name], M.D. Electronically Signed  SCH/MEDQ  D:  03/09/2011  T:  03/10/2011  Job:  [id number]  cc:   Jake Bathe, MD Joycelyn Rua, M.D.

## 2011-03-11 ENCOUNTER — Encounter (HOSPITAL_COMMUNITY): Payer: BC Managed Care – PPO | Attending: Cardiology

## 2011-03-11 DIAGNOSIS — Z951 Presence of aortocoronary bypass graft: Secondary | ICD-10-CM | POA: Insufficient documentation

## 2011-03-11 DIAGNOSIS — Z7902 Long term (current) use of antithrombotics/antiplatelets: Secondary | ICD-10-CM | POA: Insufficient documentation

## 2011-03-11 DIAGNOSIS — Z96659 Presence of unspecified artificial knee joint: Secondary | ICD-10-CM | POA: Insufficient documentation

## 2011-03-11 DIAGNOSIS — Z7982 Long term (current) use of aspirin: Secondary | ICD-10-CM | POA: Insufficient documentation

## 2011-03-11 DIAGNOSIS — E669 Obesity, unspecified: Secondary | ICD-10-CM | POA: Insufficient documentation

## 2011-03-11 DIAGNOSIS — E785 Hyperlipidemia, unspecified: Secondary | ICD-10-CM | POA: Insufficient documentation

## 2011-03-11 DIAGNOSIS — I10 Essential (primary) hypertension: Secondary | ICD-10-CM | POA: Insufficient documentation

## 2011-03-11 DIAGNOSIS — I2510 Atherosclerotic heart disease of native coronary artery without angina pectoris: Secondary | ICD-10-CM | POA: Insufficient documentation

## 2011-03-11 DIAGNOSIS — E119 Type 2 diabetes mellitus without complications: Secondary | ICD-10-CM | POA: Insufficient documentation

## 2011-03-11 DIAGNOSIS — Z5189 Encounter for other specified aftercare: Secondary | ICD-10-CM | POA: Insufficient documentation

## 2011-03-11 DIAGNOSIS — I059 Rheumatic mitral valve disease, unspecified: Secondary | ICD-10-CM | POA: Insufficient documentation

## 2011-03-13 ENCOUNTER — Encounter (HOSPITAL_COMMUNITY): Payer: BC Managed Care – PPO

## 2011-03-16 ENCOUNTER — Encounter (HOSPITAL_COMMUNITY): Payer: BC Managed Care – PPO

## 2011-03-18 ENCOUNTER — Encounter (HOSPITAL_COMMUNITY): Payer: BC Managed Care – PPO

## 2011-03-20 ENCOUNTER — Encounter (HOSPITAL_COMMUNITY): Payer: BC Managed Care – PPO

## 2011-03-23 ENCOUNTER — Encounter (HOSPITAL_COMMUNITY): Payer: BC Managed Care – PPO

## 2011-03-25 ENCOUNTER — Encounter (HOSPITAL_COMMUNITY): Payer: BC Managed Care – PPO

## 2011-03-27 ENCOUNTER — Other Ambulatory Visit: Payer: Self-pay | Admitting: Cardiology

## 2011-03-27 ENCOUNTER — Encounter (HOSPITAL_COMMUNITY): Payer: BC Managed Care – PPO

## 2011-03-30 ENCOUNTER — Other Ambulatory Visit: Payer: Self-pay | Admitting: Cardiology

## 2011-03-30 ENCOUNTER — Encounter (HOSPITAL_COMMUNITY): Payer: BC Managed Care – PPO

## 2011-04-01 ENCOUNTER — Encounter (HOSPITAL_COMMUNITY): Payer: BC Managed Care – PPO

## 2011-04-03 ENCOUNTER — Encounter (HOSPITAL_COMMUNITY): Payer: BC Managed Care – PPO

## 2011-04-06 ENCOUNTER — Encounter (HOSPITAL_COMMUNITY): Payer: BC Managed Care – PPO

## 2011-04-08 ENCOUNTER — Encounter (HOSPITAL_COMMUNITY): Payer: BC Managed Care – PPO | Attending: Cardiology

## 2011-04-08 DIAGNOSIS — E669 Obesity, unspecified: Secondary | ICD-10-CM | POA: Insufficient documentation

## 2011-04-08 DIAGNOSIS — Z7902 Long term (current) use of antithrombotics/antiplatelets: Secondary | ICD-10-CM | POA: Insufficient documentation

## 2011-04-08 DIAGNOSIS — Z7982 Long term (current) use of aspirin: Secondary | ICD-10-CM | POA: Insufficient documentation

## 2011-04-08 DIAGNOSIS — Z951 Presence of aortocoronary bypass graft: Secondary | ICD-10-CM | POA: Insufficient documentation

## 2011-04-08 DIAGNOSIS — Z96659 Presence of unspecified artificial knee joint: Secondary | ICD-10-CM | POA: Insufficient documentation

## 2011-04-08 DIAGNOSIS — I10 Essential (primary) hypertension: Secondary | ICD-10-CM | POA: Insufficient documentation

## 2011-04-08 DIAGNOSIS — I2510 Atherosclerotic heart disease of native coronary artery without angina pectoris: Secondary | ICD-10-CM | POA: Insufficient documentation

## 2011-04-08 DIAGNOSIS — I059 Rheumatic mitral valve disease, unspecified: Secondary | ICD-10-CM | POA: Insufficient documentation

## 2011-04-08 DIAGNOSIS — E119 Type 2 diabetes mellitus without complications: Secondary | ICD-10-CM | POA: Insufficient documentation

## 2011-04-08 DIAGNOSIS — Z5189 Encounter for other specified aftercare: Secondary | ICD-10-CM | POA: Insufficient documentation

## 2011-04-08 DIAGNOSIS — E785 Hyperlipidemia, unspecified: Secondary | ICD-10-CM | POA: Insufficient documentation

## 2011-04-10 ENCOUNTER — Encounter (HOSPITAL_COMMUNITY): Payer: BC Managed Care – PPO

## 2011-04-13 ENCOUNTER — Encounter (HOSPITAL_COMMUNITY): Payer: BC Managed Care – PPO

## 2011-04-15 ENCOUNTER — Encounter (HOSPITAL_COMMUNITY): Payer: BC Managed Care – PPO

## 2011-04-17 ENCOUNTER — Encounter (HOSPITAL_COMMUNITY): Payer: BC Managed Care – PPO

## 2011-04-20 ENCOUNTER — Encounter (HOSPITAL_COMMUNITY): Payer: BC Managed Care – PPO

## 2011-04-21 NOTE — Op Note (Signed)
[name], [name]             ACCOUNT NO.:  [id number]   MEDICAL RECORD NO.:  [id number]          PATIENT TYPE:  INP   LOCATION:  2899                         FACILITY:  [facility]   PHYSICIAN:  Feliberto Gottron. Turner Daniels, M.D.   DATE OF BIRTH:  1946-08-31   DATE OF PROCEDURE:  02/20/2008  DATE OF DISCHARGE:                               OPERATIVE REPORT   PREOPERATIVE DIAGNOSIS:  End-stage arthritis right knee.   POSTOPERATIVE DIAGNOSIS:  End-stage arthritis right knee.   PROCEDURE:  Right total knee arthroplasty.   SURGEON:  Feliberto Gottron.  Turner Daniels, MD   FIRST ASSISTANT:  Skip Mayer PA-C.   ANESTHETIC:  General endotracheal.   ESTIMATED BLOOD LOSS:  Minimal.   FLUID REPLACEMENT:  1200 mL crystalloid.   TOURNIQUET TIME:  One hour   DRAINS PLACED:  Two medium hemostats and Foley catheter.   URINE OUTPUT:  300 mL.   INDICATIONS FOR PROCEDURE:  This 65 year old woman has undergone a  contralateral DePuy Sigma RP total knee sometime in the past and is  doing well, has end-stage arthritis of the right knee and desires  elective total knee arthroplasty.  Risks and benefits of surgery  discussed, questions answered.  She has failed conservative treatment  with anti-inflammatory medicines, observation, physical therapy and is  down to bone-on-bone arthritic changes.  She desires elective right  total knee arthroplasty.   DESCRIPTION OF PROCEDURE:  The patient identified by armband, taken to  the block area at [facility] where a right femoral  nerve block anesthesia was induced.  She was then taken to operating  room 15.  Appropriate anesthetic monitors were attached and endotracheal  anesthesia induced.  Foley catheter inserted.  She received 1 gram of  vancomycin preoperatively.  The tourniquet was applied high to the right  thigh and the right lower extremity prepped and draped in usual sterile  fashion from the ankle to the tourniquet.  We also placed a foot  positioner and a lateral post.  Limb wrapped with an Esmarch bandage,  tourniquet inflated to 350 mmHg.  I began the procedure by making an  anterior midline incision starting a handbreadth above the patella,  going over the center of the patella, 1 cm medial to, and 3 cm distal to  the tibial tubercle.  Small bleeders in the skin and subcutaneous tissue  identified and cauterized.  The transverse retinaculum was cut in line  with the skin incision, reflected medially and then we performed a  medial parapatellar arthrotomy.  Prepatellar fat pad was resected.  Superficial medial collateral ligament elevated off the proximal tibia  from anterior to posterior leaving it intact distally about 6 cm down  the metaphysis.  We went to the posteromedial corner of the tibia.  The  patella was everted, the knee was then hyperflexed, exposing the  peripheral osteophytes and the notch which was quite stenotic.  Osteophytes were removed with rongeurs and a half inch osteotome.  We  then resected the ACL and PCL.  The tibia was translated anteriorly  using a posteromedial Z retractor, a posterior McHale retractor  through  the notch and a lateral Homan.  This exposed the proximal tibia which  was then entered with the step drill from DePuy, followed by the IM rod.  Because of the 15 degree preexisting flexion contracture, we went ahead  and cut 1 cm of proximal bone using a 0 degree sloped cutting guide  which was pinned in place.  Satisfied with the proximal tibial  resection, we then entered the distal femur 2 mm anterior to the PCL  origin, followed by the intramedullary rod and a 5 degree right distal  femoral cutting guide set at 12 mm and pinned along the epicondylar  axis.  After the distal femoral cut was accomplished, we sized for a #4  femoral component with a posterior sizing guide and hammered into place  the chamfer cutting guide.  We performed the anterior, posterior and  chamfer cuts  without difficulty, followed by the Sigma RP box cut guide  which was pinned into place.  Posterior osteophytes were also resected  from the proximal femur.  The patella was measured at 23 mm, cutting  guide set at 15 and the posterior 9 mm of the patella resected, sized to  #41 patellar button and drilled.  The knee was once again hyperflexed.  We sized for #3 tibial base plate, pinned this in place, used the  smokestack reamer and the smokestack to perform the conical ream,  followed by the Delta fin keel cuts.  We then hammered into place the  bullet and the Delta fin keel.  A #4 right trial femur was hammered into  place.  A 10 mm sigma RP trial bearing snapped onto the tibia and a 41  trial patellar button placed.  The knee was then taken through a range  of motion from full extension to full flexion with good ligamentous  stability.  At this point, the trial components removed, all bony  surfaces were Waterpiked clean, dried with suction and sponges, a double  batch of DePuy HV cement with 1500 mg of Zinacef was then mixed applied  to all bony and metallic mating surfaces except for the posterior  condyles of the femur itself.  We then hammered into place a #3 tibial  base plate and removed excess cement, followed by a #4 right distal  femoral component and removed excess cement, a 41 mm patellar button and  removed excess cement.  We then snapped into place the 10 mm Sigma RP  bearing, reduced the knee and brought it out to full extension without  difficulty and then held it in compression as the cement hardened.  Medium Hemovac drains were placed deep in the wound, which was once  again Waterpiked clean.  We made one volar look to make sure that all  bits of cement had been removed.  The parapatellar arthrotomy was then  closed with running #1 Vicryl suture.  The subcutaneous tissue with 0  and 2-0 undyed Vicryl suture and the skin with skin staples.  A dressing  of Xeroform, 4x4  dressing sponges, Webril and Ace wrap was then applied.  The patient was awakened and taken to the recovery room after letting  the tourniquet down.      Feliberto Gottron. Turner Daniels, M.D.  Electronically Signed     FJR/MEDQ  D:  02/20/2008  T:  02/20/2008  Job:  [id number]

## 2011-04-22 ENCOUNTER — Encounter (HOSPITAL_COMMUNITY): Payer: BC Managed Care – PPO

## 2011-04-24 ENCOUNTER — Encounter (HOSPITAL_COMMUNITY): Payer: BC Managed Care – PPO

## 2011-04-24 NOTE — Discharge Summary (Signed)
NAMEANQUINETTE, PIERRO             ACCOUNT NO.:  [id number]   MEDICAL RECORD NO.:  [id number]          PATIENT TYPE:  INP   LOCATION:  5031                         FACILITY:  [facility]   PHYSICIAN:  Feliberto Gottron. Turner Daniels, M.D.   DATE OF BIRTH:  1946-05-11   DATE OF ADMISSION:  02/20/2008  DATE OF DISCHARGE:  02/23/2008                               DISCHARGE SUMMARY   DIAGNOSIS:  End-stage degenerative joint disease of the right knee.   PROCEDURE:  While in hospital, right total knee arthroplasty.   DISCHARGE SUMMARY:  The patient is a 65 year old woman who has undergone  a contralateral DePuy Sigma RP total knee sometime in the past.  She is  doing well postoperatively.  There is known end-stage arthritis for her  right knee and desires elective right total knee arthroplasty.  Risks  and benefits of the surgery were discussed.  Questions were answered.  She is failed conservative treatment including NSAIDs, observation, and  physical therapy were done for the above arthritic changes.  She wishes  to proceed with elective total knee arthroplasty.   ALLERGIES:  The patient is allergic to PENICILLIN and SULFA.   MEDICATIONS:  At the time of admission included atenolol and Klor-Con.   PAST MEDICAL HISTORY:  1. Usual childhood diseases.  2. No history of hypertension.  3. Degenerative joint disease.   SURGICAL HISTORY:  Ankle ORIF, tonsils, right knee scope x3, tubal  ligation, and hysterectomy.   SOCIAL HISTORY:  No tobacco, positive ethanol 2-3 times a week.  No IV  drug use.  She is married and works as Producer, television/film/video.   FAMILY HISTORY:  Father died at age 72 with an MI.  Mother is alive and  well at age 35 with a history of hypertension and diabetes.   REVIEW OF SYSTEMS:  The patient does wear glasses.  She has a shortness  of breath, chest pain, or recent illness.   PHYSICAL EXAMINATION:  VITAL SIGNS: The patient's temperature 98.5,  pulse was 60, blood pressure 134/82.  She is  5 feet 6 inches, 195-pound  female.  HEAD: Normocephalic and atraumatic.  Ears: TMs clear.  Eyes: Pupils are equal, round, and reactive to light and accommodation.  NOSE: Patent.  THROAT:  Benign.  NECK: Supple.  Full range of motion.  CHEST: Clear to auscultation and percussion.  HEART: A 2/6 murmur, well-known to the patient previously.  ABDOMEN: Soft and nontender.  EXTREMITIES: Right knee range of motion positive to 110 degrees, 5  degrees valgus deformity, positive crepitus.  SKIN: Within normal limits with well-healed normal scar on the left  knee.   LABORATORY DATA:  X-ray showed bone-on-bone changes of the right knee.   Preoperative labs including CBC is pending.  Chest x-ray, EKG, PT and  PTT were all within normal limits with exception of a hemoglobin of  16.5, hematocrit of 47.9, AST of 40 and ALT of 56, and ALP of 36.   HOSPITAL COURSE:  On the starting the date of admission, the patient was  taken to the hospital operating room.  She underwent a right total knee  arthroplasty using DePuy Scorpio components #4 femur, #3 tibia, 4-mm  patella with 10-mm spacer.  Medium Hemovac drain was placed upon to the  wound.  The patient was placed on perioperative antibiotics.  She was  placed on postoperative Coumadin prophylaxis with target INR 1.2-2 with  bridging Lovenox therapy.  She was begun on physical therapy in the PACU  using a CPM.  She was given Dilaudid PCA for pain control.  Postoperative day 1, the patient was awake and alert.  Pain was 3/10, no  nausea or vomiting, taking p.o.'s fairly well.  Vital signs were stable.  She was afebrile.  Hemoglobin 12.8, WBC of 7.8, and urine output 1500.  Wound was clean and dry.  She was to continue using CPM machine.  Postoperative day 2, the patient was without complaints.  She has done  well with PT.  She is afebrile.  Dressing was dry.  Calf was soft and  nontender.  Hemoglobin 12.1 and INR 2.0.  She was otherwise stable.   Physical therapy was continued as the goals have not been met and it was  felt she will be discharged the following day.  Postoperative day 3, the  patient was without complaint.  T-max was 100.5.  Temperature at the  time of discharge was 98.5.  Vital signs were stable.  Hemoglobin 1.8,  WBC of 10.6, and INR 2.3.  Dressing was dry.  Wound was benign.  She was  otherwise medically stable, orthopedically improved.  She was discharged  home in care of her family.   MEDICATIONS:  At the time of discharge include:  1. Percocet and Coumadin.  __________ [facility] with a      target INR of 1.5-2.  2. Robaxin, home health RN, home health PT, durable medical equipment,      and CPM.   She should return to clinic in 1 week's time for followup check, should  not have any temperature over 101, pain is not well controlled by oral  pain medications or drainage from wound.   ACTIVITY:  Weightbearing as tolerated with total knee precautions and  walker.  Dressing changes every other day.   DIET:  Regular.      Laural Benes. Jannet Mantis.      Feliberto Gottron. Turner Daniels, M.D.  Electronically Signed    JBR/MEDQ  D:  03/12/2008  T:  03/13/2008  Job:  [id number]

## 2011-04-27 ENCOUNTER — Encounter (HOSPITAL_COMMUNITY): Payer: BC Managed Care – PPO

## 2011-04-29 ENCOUNTER — Encounter (HOSPITAL_COMMUNITY): Payer: BC Managed Care – PPO

## 2011-05-01 ENCOUNTER — Encounter (HOSPITAL_COMMUNITY): Payer: BC Managed Care – PPO

## 2011-05-04 ENCOUNTER — Encounter (HOSPITAL_COMMUNITY): Payer: BC Managed Care – PPO

## 2011-05-06 ENCOUNTER — Encounter (HOSPITAL_COMMUNITY): Payer: BC Managed Care – PPO

## 2011-05-08 ENCOUNTER — Encounter (HOSPITAL_COMMUNITY): Payer: BC Managed Care – PPO

## 2011-05-11 ENCOUNTER — Encounter (HOSPITAL_COMMUNITY): Payer: BC Managed Care – PPO

## 2011-05-13 ENCOUNTER — Encounter (HOSPITAL_COMMUNITY): Payer: BC Managed Care – PPO

## 2011-05-15 ENCOUNTER — Encounter (HOSPITAL_COMMUNITY): Payer: BC Managed Care – PPO

## 2011-05-18 ENCOUNTER — Encounter (HOSPITAL_COMMUNITY): Payer: BC Managed Care – PPO

## 2011-05-20 ENCOUNTER — Encounter (HOSPITAL_COMMUNITY): Payer: BC Managed Care – PPO

## 2011-05-22 ENCOUNTER — Encounter (HOSPITAL_COMMUNITY): Payer: BC Managed Care – PPO

## 2011-05-25 ENCOUNTER — Encounter (HOSPITAL_COMMUNITY): Payer: BC Managed Care – PPO

## 2011-05-27 ENCOUNTER — Encounter (HOSPITAL_COMMUNITY): Payer: BC Managed Care – PPO

## 2011-05-29 ENCOUNTER — Encounter (HOSPITAL_COMMUNITY): Payer: BC Managed Care – PPO

## 2011-06-01 ENCOUNTER — Encounter (HOSPITAL_COMMUNITY): Payer: BC Managed Care – PPO

## 2011-06-03 ENCOUNTER — Encounter (HOSPITAL_COMMUNITY): Payer: BC Managed Care – PPO

## 2011-06-05 ENCOUNTER — Encounter (HOSPITAL_COMMUNITY): Payer: BC Managed Care – PPO

## 2011-08-31 LAB — COMPREHENSIVE METABOLIC PANEL
ALT: 56 — ABNORMAL HIGH
AST: 40 — ABNORMAL HIGH
CO2: 31
Chloride: 96
GFR calc Af Amer: >60
GFR calc non Af Amer: >60
Sodium: 139
Total Bilirubin: 0.6

## 2011-08-31 LAB — PROTIME-INR
INR: 1
INR: 2 — ABNORMAL HIGH
INR: 2.3 — ABNORMAL HIGH
Prothrombin Time: 12.5
Prothrombin Time: 13.8
Prothrombin Time: 23.7 — ABNORMAL HIGH
Prothrombin Time: 25.7 — ABNORMAL HIGH

## 2011-08-31 LAB — CBC
HCT: 47.9 — ABNORMAL HIGH
HCT: 36.7
Hemoglobin: 16.5 — ABNORMAL HIGH
Hemoglobin: 11.8 — ABNORMAL LOW
MCHC: 34.4
MCV: 92.8
MCV: 94.1
Platelets: 287
Platelets: 219
Platelets: 257
RBC: 5.16 — ABNORMAL HIGH
RBC: 3.65 — ABNORMAL LOW
RBC: 3.73 — ABNORMAL LOW
RDW: 12.6
WBC: 6.6
WBC: 10.6 — ABNORMAL HIGH
WBC: 10.6 — ABNORMAL HIGH
WBC: 7.8

## 2011-08-31 LAB — DIFFERENTIAL
Basophils Absolute: 0
Eosinophils Absolute: 0.1
Eosinophils Relative: 2
Lymphs Abs: 2.4
Monocytes Absolute: 0.7

## 2011-08-31 LAB — TYPE AND SCREEN: ABO/RH(D): A POS

## 2011-08-31 LAB — URINALYSIS, ROUTINE W REFLEX MICROSCOPIC
Glucose, UA: NEGATIVE
Hgb urine dipstick: NEGATIVE
Protein, ur: NEGATIVE
pH: 6

## 2011-08-31 LAB — ABO/RH: ABO/RH(D): A POS

## 2011-08-31 LAB — BASIC METABOLIC PANEL
Chloride: 89 — ABNORMAL LOW
GFR calc Af Amer: >60
GFR calc non Af Amer: >60
Potassium: 3 — ABNORMAL LOW

## 2012-02-04 ENCOUNTER — Encounter (HOSPITAL_COMMUNITY): Payer: Self-pay | Admitting: Pharmacy Technician

## 2012-02-04 ENCOUNTER — Other Ambulatory Visit: Payer: Self-pay | Admitting: Cardiology

## 2012-02-04 NOTE — H&P (Signed)
Office Visit     Patient: [name] [name] Provider: Ranson Belluomini, MD  DOB: 02/08/1946 Age: 65 Y Sex: Female Date: 02/04/2012  Phone: [phone]   Address: [address], [place], [place]  Pcp: STEPHEN MEYERS       Subjective:     CC:    1. MS/stress test f/u.        HPI:  General:  65-year-old female with coronary artery disease status post bypass surgery 2/12 X4, LIMA to LAD, SVG to first diagonal with endarterectomy, saphenous vein graft to obtuse marginal, SVG to posterior descending artery by Dr. Hendrickson with brief periods of atrial fibrillation/flutter, hypertension, hyperlipidemia, overweight here following nuclear stress test which was abnormal showing significant lateral wall ischemia/anterolateral wall. Normal ejection fraction with post septal wall hypokinesis.   Symptoms include walking with dog and had chest pain across chest wall. Has been going to therapy to loosen scar tissue on chest wall. After stopping chest pain subsided. Would stop and chest pain would go away. Not the first time this has happened.   In regards to hyperlipidemia, her LDL was 72 at last check. Doing very well. Continue with simvastatin. .        ROS:  Denies any bleeding problems, no dysphasia, no syncope, no dizziness The other elements of the review of systems are negative (12 total elements).       Medical History: Hypertension - Norvasc caused edema, Allergic rhinitis (mild), L4-L5, L5-S1 disc herniations (Dr. Tooke), Cervical spondylosis and bulging discs (Dr. Tooke), Postmenopausal, Prediabetes, Mixed hyperlipidemia, Heart murmur - echo 2002 (EF 65-70%, trace MR), Obesity, CAD - s/p CABG 01/2011.        Surgical History: Right knee replacement (multiple prior surgeries) 2009, Hysterectomy (ovaries remain) - uterine prolapse/rectocele 1999, Bilateral tubal ligation 1979, Tonsillectomy 1966, Breast augmentation (saline implants) 1980, Breast biopsy (benign) 2000, Left bunionectomy 2008,  Bone spur left foot 2007, Colonoscopy (normal) 2004, replacement of ruptured breast implant. 01/2009, CABG - LIMA to LAD, SVG to diagonal with endarterectomy, SVG to obtuse marginal, SVG to PDA (Hendrickson) 2012.        Family History: Father: deceased congestive heart failure, substance abuse Mother: alive diabetes, coronary artery disease, hypertension, hyperlipidemia, allergic rhinitis, asthma Paternal Grand Father: deceased Paternal Grand Mother: deceased breast cancer Maternal Grand Father: deceased diabetes Maternal Grand Mother: deceased Brother 1: alive 1/2 brother, AAA Brother2: alive Children: spina bifida, asthma, hypertension Siblings: asthma, hypertension, allergic rhinitis 2 brother(s) .        Social History:  General:  History of smoking cigarettes: Never smoked.  no Smoking.  Alcohol: yes, one daily.  Caffeine: yes, coffee.  no Recreational drug use.  Diet: yes, low sodium.  Exercise: yes, intermittent.  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  Education: Associate Degree.  Marital Status: married.  Children: 2, Boys.  Religion: yes, Christian.        Medications: Fish Oil 1000 mg Capsule 1 capsule when she remembers, Multiple Vitamins/Womens Tablet as directed , OTC vitamin c with bioflavonoids, b complex, calcium plus d ,chromium picolinate as directed , Furosemide 20 MG Tablet 1 tablet as needed, Aspirin 81 MG Tablet 1 tablet Once a day, FreeStyle Lite Test . Strip 1 strip daily as directed, Atenolol 50 MG Tablet 1 tablet Once daily, Atorvastatin Calcium 40 MG Tablet 1 tablet Once a day, Medication List reviewed and reconciled with the patient       Allergies: Penicillin G Sodium: rash: Allergy, Sulfamethoxazole: rash: Allergy, Lisinopril: cough: Side Effects, Norvasc:   edema: Side Effects, latex, Sulfa drugs (for allergy).       Objective:     Vitals: Wt 199, Wt change 4 lb, Ht 65.5, BMI 32.61, Pulse sitting 68, BP sitting 126/74.       Examination:  Cardiology,  General:  GENERAL APPEARANCE: pleasant, NAD.  HEENT: unremarkable.  CAROTID UPSTROKE: normal, no bruit.  JVD: flat.  HEART SOUNDS: regular, normal S1, S2, no S3 or S4, chest wall scar.  MURMUR: absent.  LUNGS: no rales or wheezes.  ABDOMEN: soft, non tender, positive bowel sounds, no masses felt.  EXTREMITIES: no leg edema.  PERIPHERAL PULSES: 2 plus bilateral.        Assessment:     Assessment:  1. Abnormal cardiovascular stress test - 794.39 (Primary)  2. 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 CAD in native artery - 414.01  4. Mixed hyperlipidemia - 272.2  5. Chest pain - 786.50    Plan:     1. Abnormal cardiovascular stress test  LAB: Basic Metabolic creatinine 0.74    GLUCOSE 108 70-99 - mg/dL H   BUN 17 6-26 - mg/dL    CREATININE 0.74 0.60-1.30 - mg/dl    eGFR (NON-AFRICAN AMERICAN) 79 >60 - calc    eGFR (AFRICAN AMERICAN) 96 >60 - calc    SODIUM 141 136-145 - mmol/L    POTASSIUM 4.0 3.5-5.5 - mmol/L    CHLORIDE 104 98-107 - mmol/L    C02 30 22-32 - mg/dL    ANION GAP 10.4 6.0-20.0 - mmol/L    CALCIUM 9.6 8.6-10.3 - mg/dL     [name] [name] 02/04/2012 04:29:17 PM > okay for catheterization    LAB: CBC with Diff hemoglobin 15    WBC 9.4 4.0-11.0 - K/ul    RBC 4.64 4.20-5.40 - M/uL    HGB 15.0 12.0-16.0 - g/dL    HCT 44.5 37.0-47.0 - %    MCH 32.2 27.0-33.0 - pg    MPV 9.0 7.5-10.7 - fL    MCV 95.9 81.0-99.0 - fL    MCHC 33.6 32.0-36.0 - g/dL    RDW 12.1 11.5-15.5 - %    PLT 213 150-400 - K/uL    NEUT % 69.6 43.3-71.9 - %    LYMPH% 22.8 16.8-43.5 - %    MONO % 5.9 4.6-12.4 - %    EOS % 1.1 0.0-7.8 - %    BASO % 0.6 0.0-1.0 - %    NEUT # 6.5 1.9-7.2 - K/uL    LYMPH# 2.10 1.10-2.70 - K/uL    MONO # 0.6 0.3-0.8 - K/uL    EOS # 0.1 0.0-0.6 - K/uL    BASO # 0.1 0.0-0.1 - K/uL     [name] [name] 02/04/2012 04:29:35 PM > okay for catheterization    We will proceed with cardiac catheterization via the femoral artery approach because of bypass grafts and potential  intervention. Risks and benefits of cardiac catheterization have been reviewed including risk of stroke, heart attack, death, bleeding, renal impariment and arterial damage. There was ample oppurtuny to answer questions. Alternatives were discussed. Patient understands and wishes to proceed.       2. Mixed hyperlipidemia  Continue statin.        Immunizations:        Labs:        Procedure Codes: 80048 ECL BMP, 85025 ECL CBC PLATELET DIFF, 36415 BLOOD COLLECTION ROUTINE VENIPUNCTURE       Preventive:         Follow Up: post cath        Provider: Nil Xiong, MD  Patient: [name] [name] DOB: 05/05/1946 Date: 02/04/2012     

## 2012-02-05 ENCOUNTER — Ambulatory Visit (HOSPITAL_COMMUNITY)
Admission: RE | Admit: 2012-02-05 | Discharge: 2012-02-06 | Disposition: A | Discharge status: Home / Self Care | Payer: BC Managed Care – PPO | Source: Ambulatory Visit | Attending: Cardiology | Admitting: Cardiology

## 2012-02-05 ENCOUNTER — Encounter (HOSPITAL_COMMUNITY): Payer: Self-pay | Admitting: Cardiology

## 2012-02-05 ENCOUNTER — Other Ambulatory Visit: Payer: Self-pay

## 2012-02-05 ENCOUNTER — Encounter (HOSPITAL_COMMUNITY)
Admission: RE | Disposition: A | Discharge status: Home / Self Care | Payer: Self-pay | Source: Ambulatory Visit | Attending: Cardiology

## 2012-02-05 DIAGNOSIS — I25810 Atherosclerosis of coronary artery bypass graft(s) without angina pectoris: Secondary | ICD-10-CM | POA: Insufficient documentation

## 2012-02-05 DIAGNOSIS — I2089 Other forms of angina pectoris: Secondary | ICD-10-CM | POA: Insufficient documentation

## 2012-02-05 DIAGNOSIS — E782 Mixed hyperlipidemia: Secondary | ICD-10-CM | POA: Insufficient documentation

## 2012-02-05 DIAGNOSIS — I10 Essential (primary) hypertension: Secondary | ICD-10-CM | POA: Insufficient documentation

## 2012-02-05 DIAGNOSIS — I4891 Unspecified atrial fibrillation: Secondary | ICD-10-CM | POA: Insufficient documentation

## 2012-02-05 DIAGNOSIS — I4892 Unspecified atrial flutter: Secondary | ICD-10-CM | POA: Insufficient documentation

## 2012-02-05 DIAGNOSIS — I2510 Atherosclerotic heart disease of native coronary artery without angina pectoris: Secondary | ICD-10-CM | POA: Diagnosis present

## 2012-02-05 DIAGNOSIS — R9439 Abnormal result of other cardiovascular function study: Secondary | ICD-10-CM | POA: Insufficient documentation

## 2012-02-05 DIAGNOSIS — E663 Overweight: Secondary | ICD-10-CM | POA: Insufficient documentation

## 2012-02-05 DIAGNOSIS — I208 Other forms of angina pectoris: Secondary | ICD-10-CM | POA: Insufficient documentation

## 2012-02-05 HISTORY — PX: LEFT HEART CATHETERIZATION WITH CORONARY/GRAFT ANGIOGRAM: SHX5450

## 2012-02-05 HISTORY — DX: Atherosclerotic heart disease of native coronary artery without angina pectoris: I25.10

## 2012-02-05 LAB — POCT ACTIVATED CLOTTING TIME: Activated Clotting Time: 424 seconds

## 2012-02-05 SURGERY — LEFT HEART CATHETERIZATION WITH CORONARY/GRAFT ANGIOGRAM
Anesthesia: LOCAL

## 2012-02-05 MED ORDER — NITROGLYCERIN 0.2 MG/ML ON CALL CATH LAB
INTRAVENOUS | Status: AC
  Filled 2012-02-05: qty 1

## 2012-02-05 MED ORDER — DIAZEPAM 5 MG PO TABS
ORAL_TABLET | ORAL | Status: AC
  Filled 2012-02-05: qty 1

## 2012-02-05 MED ORDER — DIAZEPAM 5 MG PO TABS: 5.0000 mg | ORAL_TABLET | ORAL | Status: DC

## 2012-02-05 MED ORDER — MIDAZOLAM HCL 2 MG/2ML IJ SOLN
INTRAMUSCULAR | Status: AC
  Filled 2012-02-05: qty 2

## 2012-02-05 MED ORDER — BIVALIRUDIN 250 MG IV SOLR
INTRAVENOUS | Status: AC
  Filled 2012-02-05: qty 250

## 2012-02-05 MED ORDER — ONDANSETRON HCL 4 MG/2ML IJ SOLN
INTRAMUSCULAR | Status: AC
  Filled 2012-02-05: qty 2

## 2012-02-05 MED ORDER — CLOPIDOGREL BISULFATE 75 MG PO TABS
75.0000 mg | ORAL_TABLET | Freq: Every day | ORAL | Status: DC
  Administered 2012-02-06: 75 mg via ORAL
  Filled 2012-02-05: qty 1

## 2012-02-05 MED ORDER — ATENOLOL 50 MG PO TABS
50.0000 mg | ORAL_TABLET | Freq: Every day | ORAL | Status: DC
  Filled 2012-02-05: qty 1

## 2012-02-05 MED ORDER — OXYCODONE-ACETAMINOPHEN 5-325 MG PO TABS
ORAL_TABLET | ORAL | Status: AC
  Filled 2012-02-05: qty 1

## 2012-02-05 MED ORDER — ASPIRIN 81 MG PO CHEW: 324.0000 mg | CHEWABLE_TABLET | ORAL | Status: DC

## 2012-02-05 MED ORDER — ASPIRIN EC 325 MG PO TBEC: 325.0000 mg | DELAYED_RELEASE_TABLET | Freq: Every day | ORAL | Status: DC

## 2012-02-05 MED ORDER — TICAGRELOR 90 MG PO TABS
ORAL_TABLET | ORAL | Status: AC
  Filled 2012-02-05: qty 2

## 2012-02-05 MED ORDER — SODIUM CHLORIDE 0.9 % IV SOLN: 250.0000 mL | INTRAVENOUS | Status: DC | PRN

## 2012-02-05 MED ORDER — ONDANSETRON HCL 4 MG/2ML IJ SOLN
4.0000 mg | Freq: Four times a day (QID) | INTRAMUSCULAR | Status: DC | PRN
  Administered 2012-02-05: 4 mg via INTRAVENOUS

## 2012-02-05 MED ORDER — SODIUM CHLORIDE 0.9 % IJ SOLN: 3.0000 mL | INTRAMUSCULAR | Status: DC | PRN

## 2012-02-05 MED ORDER — SODIUM CHLORIDE 0.9 % IJ SOLN: 3.0000 mL | Freq: Two times a day (BID) | INTRAMUSCULAR | Status: DC

## 2012-02-05 MED ORDER — ASPIRIN EC 81 MG PO TBEC: 81.0000 mg | DELAYED_RELEASE_TABLET | Freq: Every day | ORAL | Status: DC

## 2012-02-05 MED ORDER — HEPARIN (PORCINE) IN NACL 2-0.9 UNIT/ML-% IJ SOLN
INTRAMUSCULAR | Status: AC
  Filled 2012-02-05: qty 2000

## 2012-02-05 MED ORDER — OXYCODONE-ACETAMINOPHEN 5-325 MG PO TABS
1.0000 | ORAL_TABLET | ORAL | Status: DC | PRN
  Administered 2012-02-05: 1 via ORAL

## 2012-02-05 MED ORDER — FENTANYL CITRATE 0.05 MG/ML IJ SOLN
INTRAMUSCULAR | Status: AC
  Filled 2012-02-05: qty 2

## 2012-02-05 MED ORDER — LIDOCAINE-EPINEPHRINE 1 %-1:100000 IJ SOLN
INTRAMUSCULAR | Status: AC
  Filled 2012-02-05: qty 1

## 2012-02-05 MED ORDER — ATORVASTATIN CALCIUM 40 MG PO TABS
40.0000 mg | ORAL_TABLET | Freq: Every day | ORAL | Status: DC
  Administered 2012-02-05: 40 mg via ORAL
  Filled 2012-02-05 (×2): qty 1

## 2012-02-05 MED ORDER — ASPIRIN 81 MG PO CHEW
CHEWABLE_TABLET | ORAL | Status: AC
  Filled 2012-02-05: qty 4

## 2012-02-05 MED ORDER — LIDOCAINE HCL (PF) 1 % IJ SOLN
INTRAMUSCULAR | Status: AC
  Filled 2012-02-05: qty 30

## 2012-02-05 MED ORDER — SODIUM CHLORIDE 0.9 % IV SOLN: INTRAVENOUS | Status: DC

## 2012-02-05 MED ORDER — SODIUM CHLORIDE 0.9 % IV SOLN
1.0000 mL/kg/h | INTRAVENOUS | Status: AC
  Administered 2012-02-05: 1 mL/kg/h via INTRAVENOUS

## 2012-02-05 NOTE — Discharge Instructions (Signed)
[facility] [address] [place], [place] [address] [phone]- Please get your 30 day supply free At above pharmacy. Thanks

## 2012-02-05 NOTE — Progress Notes (Signed)
.   CM called [facility] in [place] and she will have to get 30 day free at [facility] on [address]. They have a bottle available. Will place address on AVS form for pt. [address] [place], [place] [address] [phone] [place]ala [place], [place] [phone]

## 2012-02-05 NOTE — Progress Notes (Deleted)
TR BAND REMOVAL  LOCATION: left radial  DEFLATED PER PROTOCOL:  yes  TIME BAND OFF / DRESSING APPLIED:   1530   SITE UPON ARRIVAL:   Level 0  SITE AFTER BAND REMOVAL:  Level 0  REVERSE ALLEN'S TEST:    positive  CIRCULATION SENSATION AND MOVEMENT:  Within Normal Limits  yes  COMMENTS:

## 2012-02-05 NOTE — Interval H&P Note (Signed)
History and Physical Interval Note:  02/05/2012 12:13 PM  [name]  has presented today for surgery, with the diagnosis of chest pain  The various methods of treatment have been discussed with the patient and family. After consideration of risks, benefits and other options for treatment, the patient has consented to  Procedure(s) (LRB): LEFT HEART CATHETERIZATION WITH CORONARY/GRAFT ANGIOGRAM () as a surgical intervention .  The patients' history has been reviewed, patient examined, no change in status, stable for surgery.  I have reviewed the patients' chart and labs.  Questions were answered to the patient's satisfaction.   Discussed risk of CVA, death, bleeding, limb ischemia.  Christinea Brizuela

## 2012-02-05 NOTE — H&P (View-Only) (Signed)
Office Visit     Patient: [name] Provider: Donato Schultz, MD  DOB: 10/04/1946 Age: 66 Y Sex: Female Date: 02/04/2012  Phone: [phone]   Address: [address], [place], [place]  Pcp: STEPHEN MEYERS       Subjective:     CC:    1. MS/stress test f/u.        HPI:  General:  66 year old female with coronary artery disease status post bypass surgery 2/12 X4, LIMA to LAD, SVG to first diagonal with endarterectomy, saphenous vein graft to obtuse marginal, SVG to posterior descending artery by Dr. [name] with brief periods of atrial fibrillation/flutter, hypertension, hyperlipidemia, overweight here following nuclear stress test which was abnormal showing significant lateral wall ischemia/anterolateral wall. Normal ejection fraction with post septal wall hypokinesis.   Symptoms include walking with dog and had chest pain across chest wall. Has been going to therapy to loosen scar tissue on chest wall. After stopping chest pain subsided. Would stop and chest pain would go away. Not the first time this has happened.   In regards to hyperlipidemia, her LDL was 72 at last check. Doing very well. Continue with simvastatin. .        ROS:  Denies any bleeding problems, no dysphasia, no syncope, no dizziness The other elements of the review of systems are negative (12 total elements).       Medical History: Hypertension - Norvasc caused edema, Allergic rhinitis (mild), L4-L5, L5-S1 disc herniations (Dr. Alveda Reasons), Cervical spondylosis and bulging discs (Dr. Alveda Reasons), Postmenopausal, Prediabetes, Mixed hyperlipidemia, Heart murmur - echo 2002 (EF 65-70%, trace MR), Obesity, CAD - s/p CABG 01/2011.        Surgical History: Right knee replacement (multiple prior surgeries) 2009, Hysterectomy (ovaries remain) - uterine prolapse/rectocele 1999, Bilateral tubal ligation 1979, Tonsillectomy 1966, Breast augmentation (saline implants) 1980, Breast biopsy (benign) 2000, Left bunionectomy 2008,  Bone spur left foot 2007, Colonoscopy (normal) 2004, replacement of ruptured breast implant. 01/2009, CABG - LIMA to LAD, SVG to diagonal with endarterectomy, SVG to obtuse marginal, SVG to PDA [name]) 2012.        Family History: Father: deceased congestive heart failure, substance abuse Mother: alive diabetes, coronary artery disease, hypertension, hyperlipidemia, allergic rhinitis, asthma Paternal Grand Father: deceased Paternal Grand Mother: deceased breast cancer Maternal Grand Father: deceased diabetes Maternal Grand Mother: deceased Brother 1: alive 1/2 brother, AAA Brother2: alive Children: spina bifida, asthma, hypertension Siblings: asthma, hypertension, allergic rhinitis 2 brother(s) .        Social History:  General:  History of smoking cigarettes: Never smoked.  no Smoking.  Alcohol: yes, one daily.  Caffeine: yes, coffee.  no Recreational drug use.  Diet: yes, low sodium.  Exercise: yes, intermittent.  Occupation: employed, Control and instrumentation engineer.  Education: Associate Degree.  Marital Status: married.  Children: 2, Boys.  Religion: yes, Christian.        Medications: Fish Oil 1000 mg Capsule 1 capsule when she remembers, Multiple Vitamins/Womens Tablet as directed , OTC vitamin c with bioflavonoids, b complex, calcium plus d ,chromium picolinate as directed , Furosemide 20 MG Tablet 1 tablet as needed, Aspirin 81 MG Tablet 1 tablet Once a day, FreeStyle Lite Test . Strip 1 strip daily as directed, Atenolol 50 MG Tablet 1 tablet Once daily, Atorvastatin Calcium 40 MG Tablet 1 tablet Once a day, Medication List reviewed and reconciled with the patient       Allergies: Penicillin G Sodium: rash: Allergy, Sulfamethoxazole: rash: Allergy, Lisinopril: cough: Side Effects, Norvasc:  edema: Side Effects, latex, Sulfa drugs (for allergy).       Objective:     Vitals: Wt 199, Wt change 4 lb, Ht 65.5, BMI 32.61, Pulse sitting 68, BP sitting 126/74.       Examination:  Cardiology,  General:  GENERAL APPEARANCE: pleasant, NAD.  HEENT: unremarkable.  CAROTID UPSTROKE: normal, no bruit.  JVD: flat.  HEART SOUNDS: regular, normal S1, S2, no S3 or S4, chest wall scar.  MURMUR: absent.  LUNGS: no rales or wheezes.  ABDOMEN: soft, non tender, positive bowel sounds, no masses felt.  EXTREMITIES: no leg edema.  PERIPHERAL PULSES: 2 plus bilateral.        Assessment:     Assessment:  1. Abnormal cardiovascular stress test - 794.39 (Primary)  2. 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 CAD in native artery - 414.01  4. Mixed hyperlipidemia - 272.2  5. Chest pain - 786.50    Plan:     1. Abnormal cardiovascular stress test  LAB: Basic Metabolic creatinine 0.74    GLUCOSE 108 70-99 - mg/dL H   BUN 17 1-61 - mg/dL    CREATININE 0.96 0.45-4.09 - mg/dl    eGFR (NON-AFRICAN AMERICAN) 79 >60 - calc    eGFR (AFRICAN AMERICAN) 96 >60 - calc    SODIUM 141 136-145 - mmol/L    POTASSIUM 4.0 3.5-5.5 - mmol/L    CHLORIDE 104 98-107 - mmol/L    C02 30 22-32 - mg/dL    ANION GAP 81.1 9.1-47.8 - mmol/L    CALCIUM 9.6 8.6-10.3 - mg/dL     SKAINS,MARK 29/56/2130 04:29:17 PM > okay for catheterization    LAB: CBC with Diff hemoglobin 15    WBC 9.4 4.0-11.0 - K/ul    RBC 4.64 4.20-5.40 - M/uL    HGB 15.0 12.0-16.0 - g/dL    HCT 86.5 78.4-69.6 - %    MCH 32.2 27.0-33.0 - pg    MPV 9.0 7.5-10.7 - fL    MCV 95.9 81.0-99.0 - fL    MCHC 33.6 32.0-36.0 - g/dL    RDW 29.5 28.4-13.2 - %    PLT 213 150-400 - K/uL    NEUT % 69.6 43.3-71.9 - %    LYMPH% 22.8 16.8-43.5 - %    MONO % 5.9 4.6-12.4 - %    EOS % 1.1 0.0-7.8 - %    BASO % 0.6 0.0-1.0 - %    NEUT # 6.5 1.9-7.2 - K/uL    LYMPH# 2.10 1.10-2.70 - K/uL    MONO # 0.6 0.3-0.8 - K/uL    EOS # 0.1 0.0-0.6 - K/uL    BASO # 0.1 0.0-0.1 - K/uL     SKAINS,MARK 02/04/2012 04:29:35 PM > okay for catheterization    We will proceed with cardiac catheterization via the femoral artery approach because of bypass grafts and potential  intervention. Risks and benefits of cardiac catheterization have been reviewed including risk of stroke, heart attack, death, bleeding, renal impariment and arterial damage. There was ample oppurtuny to answer questions. Alternatives were discussed. Patient understands and wishes to proceed.       2. Mixed hyperlipidemia  Continue statin.        Immunizations:        Labs:        Procedure Codes: 44010 ECL BMP, 85025 ECL CBC PLATELET DIFF, 27253 BLOOD COLLECTION ROUTINE VENIPUNCTURE       Preventive:         Follow Up: post cath  Provider: Donato Schultz, MD  Patient: [name] DOB: 17-Sep-1946 Date: 02/04/2012

## 2012-02-05 NOTE — CV Procedure (Signed)
PROCEDURE:  Left heart catheterization with selective coronary angiography, left ventriculogram, LIMA graft, SVG grafts. Femoral artery approach.   INDICATIONS:  66 year old with coronary artery disease status post bypass in February of 2012 with LIMA to LAD, SVG to diagonal, SVG to obtuse marginal, SVG to PDA here with increasing chest burning across chest wall, nuclear stress test demonstrating lateral wall/anterolateral wall ischemia of moderate degree. Symptoms similar to that prior to bypass.  The risks, benefits, and details of the procedure were explained to the patient.  The patient verbalized understanding and wanted to proceed.  Informed written consent was obtained.  PROCEDURE TECHNIQUE:  After Xylocaine anesthesia and visualization of the femoral head via fluoroscopy, a 76F sheath was placed in the right femoral artery with a single anterior needle wall stick.   Left coronary angiography was done using a Judkins L4 catheter.  Right coronary angiography was done using a Judkins R4 catheter. LIMA catheter was used to selectively engage the LIMA graft. An injection. Left ventriculography was done using a pigtail catheter.    CONTRAST:  Total of 60 ml.  FLOUROSCOPY TIME: 8.7 minutes.   COMPLICATIONS:  None.    HEMODYNAMICS:  Aortic pressure was 120/61/85 mmHg; LV systolic pressure was 120 mmHg; LVEDP 17 and mmHg.  There was no gradient between the left ventricle and aorta.    ANGIOGRAPHIC DATA:    Left main: Widely patent branching to the circumflex, LAD  Left anterior descending (LAD): The artery is significantly diseased with a 90% occlusion after the first septal branch. There is mild competitive flow in the mid LAD. There is a small diagonal branch patent.  Circumflex artery (CIRC): The proximal circumflex has a 90% stenosis, a high obtuse marginal branch is quite small in caliber. The mid to distal circumflex has up to 70% stenosis.  Right coronary artery (RCA): Dominant vessel  giving rise to PDA. At the branch point of the posterior descending artery there is a 90 bend and stenosis of up to 70%.  LEFT VENTRICULOGRAM:  Left ventricular angiogram was done in the 30 RAO projection and revealed normal left ventricular wall motion and systolic function with an estimated ejection fraction of 60%.   Grafts:  1. LIMA to LAD-widely patent 2. SVG to obtuse marginal, SVG to PDA, SVG to diagonal are occluded.  IMPRESSIONS:  1. Occluded vein grafts to obtuse marginal right coronary artery, diagonal.  2. Widely patent LIMA to LAD.  3. 90% proximal circumflex stenosis, 70% distal/mid stenosis.  4. 90% proximal LAD stenosis.  5. Normal left ventricular ejection fraction of 60% with no wall motion abnormalities.  RECOMMENDATION: A nuclear stress test revealed lateral wall ischemia. I discussed case with her as well as Dr. Eldridge Dace. We will proceed with stenting to the obtuse marginal. I have discussed this with her husband as well. She will stay overnight and will be discharged tomorrow barring any complications. Dual antiplatelet therapy, Brilinta.

## 2012-02-05 NOTE — CV Procedure (Signed)
PROCEDURE:  PCI left circumflex  INDICATIONS:  Abnormal stress test  The risks, benefits, and details of the procedure were explained to the patient.  The patient verbalized understanding and wanted to proceed.  Informed written consent was obtained.  PROCEDURE TECHNIQUE:  Dr. [name] performed the diagnostic cath.   This revealed that the left circumflex artery is a large vessel.  Midportion of the vessel after the first OM, there is a focal 90% stenosis.  Further along the vessel, there is some moderate diffuse disease up to 70%.  Bivalirudin was used for anticoagulation.  An ACT was used to confirm that this was therapeutic.  A pro-water wire was placed down the circumflex.  A 2.5 x 12 balloon was used to predilate the different areas of disease.  Several inflations were performed.  After the distal area of disease was predilated, there was a visible dissection.  A 2.5 x 38 Promus element stent was deployed at 12 atmospheres.  A 2.75 x 16 Promus element stent was then placed in overlapping fashion across the more proximal area of disease.  This was deployed at 14 atmospheres.  A 3.0 x 20 in sequence and apex: Was then advanced to the distal portion of the stented area in the plate at 10 atmospheres.  In the midportion it was deployed at 16 atmospheres.  In the proximal area, it was deployed at 18 atmospheres.  There was a haziness at the proximal edge of the stent.  There was some concern for an edge dissection.  The Proler wire was placed again in the vessel.  Intracoronary nitroglycerin was administered to treat vasospasm.  The IVUS catheter was then advanced into the stented area and pulled back.  The proximal edge of the stent did not appear to have caused a dissection.  There is a small atrial branch which was coming out which was likely causing the angiographic abnormality.  A pullback continue to the proximal circumflex.  The cross-sectional area of the proximal circumflex was over 5 mm squared.   Additional intracoronary nitroglycerin was given to treat vasospasm caused by the IVUS catheter. There is no residual stenosis.  TIMI-3 flow was maintained throughout.  Lesion length was 48 mm.  An Angio-Seal was deployed for hemostasis.  There were some difficulty getting the first Angio-Seal sheath in.  We tried a second Angio-Seal sheath which went in without difficulty.  The Angio-Seal was deployed successfully.  There was some mild oozing at the end of the case from the skin tract which was treated with lidocaine with epinephrine locally.  The patient had a 3+ posterior tibial pulse on the right at the end of the procedure.  There is no visible hematoma.  CONTRAST:  Total of 110 cc.  COMPLICATIONS:  None.     IMPRESSIONS:  1. Successful PCI of the left circumflex with overlapping Promus drug-eluting stents, 2.5 x 38 and 2.75 x 16.  The stented area was postdilated to greater than 3 mm in the mid and proximal stented portion. 2.   intravascular ultrasound at the end of the case showed well opposed stent with no evidence of edge dissection.  Moderate disease in the ostium of the circumflex with a cross-sectional area greater than 5 mm.  RECOMMENDATION:  Continue aggressive secondary prevention She will need dual antiplatelet therapy for at least a year.  She'll be watched overnight.

## 2012-02-05 NOTE — Progress Notes (Signed)
   CARE MANAGEMENT NOTE 02/05/2012  Patient:  [name], [name]   Account Number:  [id number]  Date Initiated:  02/05/2012  Documentation initiated by:  GRAVES-BIGELOW,Katrina Brosh  Subjective/Objective Assessment:   Pt admitted with cp. S/p PCI left circumflex. Plan for home in am on brilinta.     Action/Plan:   CM will provide pt with 30 day free /copay card. Benefits check in process and will make pt aware when complete. MD please write rx for 30 day free brilinta no refills and then the original with refills.   Anticipated DC Date:  02/06/2012   Anticipated DC Plan:  HOME/SELF CARE      DC Planning Services  CM consult      Choice offered to / List presented to:             Status of service:  Completed, signed off Medicare Important Message given?   (If response is "NO", the following Medicare IM given date fields will be blank) Date Medicare IM given:   Date Additional Medicare IM given:    Discharge Disposition:  HOME/SELF CARE  Per UR Regulation:    Comments:  02-05-12 [address] Tomi Bamberger, RN,BSN [phone] CM will continue to monitor for benefits check and will make pt aware when complete.

## 2012-02-06 ENCOUNTER — Other Ambulatory Visit: Payer: Self-pay

## 2012-02-06 LAB — BASIC METABOLIC PANEL
CO2: 27 mEq/L (ref 19–32)
Calcium: 9 mg/dL (ref 8.4–10.5)
Chloride: 102 mEq/L (ref 96–112)
Sodium: 138 mEq/L (ref 135–145)

## 2012-02-06 LAB — CBC
HCT: 42.5 % (ref 36.0–46.0)
Hemoglobin: 14 g/dL (ref 12.0–15.0)
MCHC: 32.9 g/dL (ref 30.0–36.0)
MCV: 95.9 fL (ref 78.0–100.0)
RDW: 12.6 % (ref 11.5–15.5)

## 2012-02-06 MED ORDER — CLOPIDOGREL BISULFATE 75 MG PO TABS: 75.0000 mg | ORAL_TABLET | Freq: Every day | ORAL | Status: AC

## 2012-02-06 MED ORDER — ASPIRIN 325 MG PO TBEC: 325.0000 mg | DELAYED_RELEASE_TABLET | Freq: Every day | ORAL | Status: AC

## 2012-02-06 NOTE — Progress Notes (Signed)
.................................  CARDIAC REHAB PHASE I   PRE:  Rate/Rhythm: Sinus Rhythm 61  BP:  Supine:   Sitting:   Standing: 142/69   SaO2: 99% Room air  MODE:  Ambulation: 600 ft   POST:  Rate/Rhythm: Sinus Rhythm 66  BP:  Supine:   Sitting: 153/76  Standing:    SaO2: 100% Room Air  Education completed. Patient tolerated ambulation without complaints or chest pain. Patient is interested in attending outpatient phase 2 cardiac rehab  [phone].  Harlon Flor, Arta Bruce

## 2012-02-06 NOTE — Discharge Summary (Signed)
Patient ID: [name] MRN: [id number] DOB/AGE: [birth year] 66 y.o.  Admit date: 02/05/2012 Discharge date: 02/06/2012  Primary Discharge Diagnosis: Coronary artery disease status post successful drug-coated stent placement to native circumflex artery after all 3 SVG grafts were noted to be occluded. LIMA to LAD is patent. Abnormal nuclear stress test with lateral wall ischemia.   Secondary Discharge Diagnosis: Hyperlipidemia, hypertension, CABG 2/12  Significant Diagnostic Studies: CATH 02/05/12: all 3 SVG grafts were occluded. LIMA to LAD patent. Normal EF. Circ stent DES.  Hospital Course: 66 year old with recent CABG 2/12 who has been experiencing substernal CP across chest wall. NUC with lateral ischemia. Normal EF. CATH as above.   Dr. Eldridge Dace placed circ stent. Did well overnight. No CP, no SOB. Minor pain at cath site when palpated.   Plavix for one year.    Discharge Exam: Blood pressure 127/59, pulse 61, temperature 98.1 F (36.7 C), temperature source Oral, resp. rate 14, height 5\' 6"  (1.676 m), weight 90.9 kg (200 lb 6.4 oz), SpO2 95.00%.    GEN: AAO in NAD CV: RRR, 2/6 SM LUNGS: CTAB ABD: soft NT BS EXT: no bruit over cath site, c/d/i. Mild tenderness at cath site. No significant hematoma.   Labs:   Lab Results  Component Value Date   WBC 9.0 02/06/2012   HGB 14.0 02/06/2012   HCT 42.5 02/06/2012   MCV 95.9 02/06/2012   PLT 203 02/06/2012    Lab 02/06/12 0500  NA 138  K 3.7  CL 102  CO2 27  BUN 13  CREATININE 0.74  CALCIUM 9.0  PROT --  BILITOT --  ALKPHOS --  ALT --  AST --  GLUCOSE 152*     EKG: SR, NSSTW changes  FOLLOW UP PLANS AND APPOINTMENTS Discharge Orders    Future Orders Please Complete By Expires   Diet - low sodium heart healthy      Increase activity slowly        Medication List  As of 02/06/2012  7:47 AM   TAKE these medications         aspirin 325 MG EC tablet   Take 1 tablet (325 mg total) by mouth daily.      atenolol 50 MG  tablet   Commonly known as: TENORMIN   Take 50 mg by mouth daily.      atorvastatin 40 MG tablet   Commonly known as: LIPITOR   Take 40 mg by mouth daily.      clopidogrel 75 MG tablet   Commonly known as: PLAVIX   Take 1 tablet (75 mg total) by mouth daily with breakfast.      furosemide 20 MG tablet   Commonly known as: LASIX   Take 20 mg by mouth daily as needed. For edema      mulitivitamin with minerals Tabs   Take 1 tablet by mouth daily.           Follow-up Information    Follow up with FERGUSON,CYNTHIA A, NP on 02/12/2012. (9:30)    Contact information:   [facility] [address] [place] [address] [phone]          BRING ALL MEDICATIONS WITH YOU TO FOLLOW UP APPOINTMENTS  Time spent with patient to include physician time: with patient education, review of labs, medical records, setting up appt. Dc summ.   SignedDonato Schultz 02/06/2012, 7:47 AM

## 2012-02-08 MED FILL — Dextrose Inj 5%: INTRAVENOUS | Qty: 50 | Status: AC

## 2012-02-18 ENCOUNTER — Encounter (HOSPITAL_COMMUNITY): Payer: Self-pay

## 2012-02-18 ENCOUNTER — Encounter (HOSPITAL_COMMUNITY)
Admission: RE | Admit: 2012-02-18 | Discharge: 2012-02-18 | Disposition: A | Discharge status: Home / Self Care | Payer: BC Managed Care – PPO | Source: Ambulatory Visit | Attending: Cardiology | Admitting: Cardiology

## 2012-02-18 DIAGNOSIS — E785 Hyperlipidemia, unspecified: Secondary | ICD-10-CM | POA: Insufficient documentation

## 2012-02-18 DIAGNOSIS — R011 Cardiac murmur, unspecified: Secondary | ICD-10-CM | POA: Insufficient documentation

## 2012-02-18 DIAGNOSIS — I10 Essential (primary) hypertension: Secondary | ICD-10-CM | POA: Insufficient documentation

## 2012-02-18 DIAGNOSIS — I2510 Atherosclerotic heart disease of native coronary artery without angina pectoris: Secondary | ICD-10-CM | POA: Insufficient documentation

## 2012-02-18 DIAGNOSIS — Z951 Presence of aortocoronary bypass graft: Secondary | ICD-10-CM | POA: Insufficient documentation

## 2012-02-18 DIAGNOSIS — Z5189 Encounter for other specified aftercare: Secondary | ICD-10-CM | POA: Insufficient documentation

## 2012-02-18 DIAGNOSIS — Z9861 Coronary angioplasty status: Secondary | ICD-10-CM | POA: Insufficient documentation

## 2012-02-18 DIAGNOSIS — E669 Obesity, unspecified: Secondary | ICD-10-CM | POA: Insufficient documentation

## 2012-02-22 ENCOUNTER — Encounter (HOSPITAL_COMMUNITY)
Admission: RE | Admit: 2012-02-22 | Discharge: 2012-02-22 | Disposition: A | Discharge status: Home / Self Care | Payer: BC Managed Care – PPO | Source: Ambulatory Visit | Attending: Cardiology | Admitting: Cardiology

## 2012-02-22 ENCOUNTER — Encounter (HOSPITAL_COMMUNITY): Payer: Self-pay

## 2012-02-22 NOTE — Progress Notes (Signed)
Pt started cardiac rehab today.  Pt tolerated light exercise without difficulty.  Denies chest pain or dyspnea. Telemetry-sinus rhythm, rare pac.  VSS.  Pt oriented to exercise equipment and routine.  Understanding verbalized.

## 2012-02-24 ENCOUNTER — Encounter (HOSPITAL_COMMUNITY)
Admission: RE | Admit: 2012-02-24 | Discharge: 2012-02-24 | Disposition: A | Discharge status: Home / Self Care | Payer: BC Managed Care – PPO | Source: Ambulatory Visit | Attending: Cardiology | Admitting: Cardiology

## 2012-02-24 NOTE — Progress Notes (Signed)
[name] 66 y.o. female       Nutrition Screen                                                                    YES  NO Do you live in a nursing home?  X   Do you eat out more than 3 times/week?    X If yes, how many times per week do you eat out?  Do you have food allergies?   X If yes, what are you allergic to?  Have you gained or lost more than 10 lbs without trying?               X If yes, how much weight have you lost and over what time period?  lbs gained or lost over  weeks/month  Do you want to lose weight?    X  If yes, what is a goal weight or amount of weight you would like to lose? 180 lb or less (170 lbs ideal)  Do you eat alone most of the time?   X   Do you eat less than 2 meals/day?  X If yes, how many meals do you eat?  Do you drink more than 3 alcohol drinks/day?  X If yes, how many drinks per day?  Are you having trouble with constipation? *  X If yes, what are you doing to help relieve constipation?  Do you have financial difficulties with buying food?*    X   Are you experiencing regular nausea/ vomiting?*     X   Do you have a poor appetite? *                                        X   Do you have trouble chewing/swallowing? *   X    Pt with diagnoses of:  X CABG              X Stent/ PTCA X Dyslipidemia  / HDL< 40 / LDL>70 / High TG      X %  Body fat >goal / Body Mass Index >25 X HTN / BP >120/80 X Pre-diabetes       Pt Risk Score   1       Diagnosis Risk Score  30       Total Risk Score   31                        High Risk               X Low Risk    HT: 67" Ht Readings from Last 1 Encounters:  02/18/12 5\' 7"  (1.702 m)    WT:   195.4 lb (88.8 kg) Wt Readings from Last 3 Encounters:  02/18/12 195 lb 12.3 oz (88.8 kg)  02/06/12 200 lb 6.4 oz (90.9 kg)  02/06/12 200 lb 6.4 oz (90.9 kg)     IBW 61.4 145%IBW BMI 30.7 41.5%body fat  Meds reviewed: MVI PMH: HTN, Allergic rhinitis, L4-L5 and L5-S1 disc herniations, Prediabetes, Mixed  hyperlipidemia, Heart murmur, Obesity, CAD s/p CABG  01/2011       Activity level: Pt is sedentary   Wt goal: 171-183 lb ( 77.7-83.2 kg) Current tobacco use? No      Food/Drug Interaction? No       Labs:  Lipid Panel  No results found for this basename: chol, trig, hdl, cholhdl, vldl, ldlcalc  Last A1c 01/23/11 5.4 03/30/11 Glucose 99  LDL goal: < 100      MI, DM, Carotid or PVD and > 2:      HTN, family h/o, >65 yo female,  Estimated Daily Nutrition Needs for: ? wt loss  1200-1700 Kcal , Total Fat 30-45gm, Saturated Fat 9-13 gm, Trans Fat 1.2-1.7 gm,  Sodium less than 1500 mg

## 2012-02-26 ENCOUNTER — Encounter (HOSPITAL_COMMUNITY)
Admission: RE | Admit: 2012-02-26 | Discharge: 2012-02-26 | Disposition: A | Discharge status: Home / Self Care | Payer: BC Managed Care – PPO | Source: Ambulatory Visit | Attending: Cardiology | Admitting: Cardiology

## 2012-02-29 ENCOUNTER — Encounter (HOSPITAL_COMMUNITY)
Admission: RE | Admit: 2012-02-29 | Discharge: 2012-02-29 | Disposition: A | Discharge status: Home / Self Care | Payer: BC Managed Care – PPO | Source: Ambulatory Visit | Attending: Cardiology | Admitting: Cardiology

## 2012-03-02 ENCOUNTER — Encounter (HOSPITAL_COMMUNITY)
Admission: RE | Admit: 2012-03-02 | Discharge: 2012-03-02 | Disposition: A | Discharge status: Home / Self Care | Payer: BC Managed Care – PPO | Source: Ambulatory Visit | Attending: Cardiology | Admitting: Cardiology

## 2012-03-04 ENCOUNTER — Encounter (HOSPITAL_COMMUNITY)
Admission: RE | Admit: 2012-03-04 | Discharge: 2012-03-04 | Disposition: A | Discharge status: Home / Self Care | Payer: BC Managed Care – PPO | Source: Ambulatory Visit | Attending: Cardiology | Admitting: Cardiology

## 2012-03-07 ENCOUNTER — Encounter (HOSPITAL_COMMUNITY)
Admission: RE | Admit: 2012-03-07 | Discharge: 2012-03-07 | Disposition: A | Discharge status: Home / Self Care | Payer: BC Managed Care – PPO | Source: Ambulatory Visit | Attending: Cardiology | Admitting: Cardiology

## 2012-03-07 DIAGNOSIS — Z951 Presence of aortocoronary bypass graft: Secondary | ICD-10-CM | POA: Insufficient documentation

## 2012-03-07 DIAGNOSIS — R011 Cardiac murmur, unspecified: Secondary | ICD-10-CM | POA: Insufficient documentation

## 2012-03-07 DIAGNOSIS — Z9861 Coronary angioplasty status: Secondary | ICD-10-CM | POA: Insufficient documentation

## 2012-03-07 DIAGNOSIS — I10 Essential (primary) hypertension: Secondary | ICD-10-CM | POA: Insufficient documentation

## 2012-03-07 DIAGNOSIS — Z5189 Encounter for other specified aftercare: Secondary | ICD-10-CM | POA: Insufficient documentation

## 2012-03-07 DIAGNOSIS — E785 Hyperlipidemia, unspecified: Secondary | ICD-10-CM | POA: Insufficient documentation

## 2012-03-07 DIAGNOSIS — E669 Obesity, unspecified: Secondary | ICD-10-CM | POA: Insufficient documentation

## 2012-03-07 DIAGNOSIS — I2510 Atherosclerotic heart disease of native coronary artery without angina pectoris: Secondary | ICD-10-CM | POA: Insufficient documentation

## 2012-03-09 ENCOUNTER — Encounter (HOSPITAL_COMMUNITY)
Admission: RE | Admit: 2012-03-09 | Discharge: 2012-03-09 | Disposition: A | Discharge status: Home / Self Care | Payer: BC Managed Care – PPO | Source: Ambulatory Visit | Attending: Cardiology | Admitting: Cardiology

## 2012-03-11 ENCOUNTER — Encounter (HOSPITAL_COMMUNITY)
Admission: RE | Admit: 2012-03-11 | Discharge: 2012-03-11 | Disposition: A | Discharge status: Home / Self Care | Payer: BC Managed Care – PPO | Source: Ambulatory Visit | Attending: Cardiology | Admitting: Cardiology

## 2012-03-14 ENCOUNTER — Encounter (HOSPITAL_COMMUNITY)
Admission: RE | Admit: 2012-03-14 | Discharge: 2012-03-14 | Disposition: A | Discharge status: Home / Self Care | Payer: BC Managed Care – PPO | Source: Ambulatory Visit | Attending: Cardiology | Admitting: Cardiology

## 2012-03-14 NOTE — Progress Notes (Signed)
[name] 66 y.o. female Nutrition Note  Spoke with pt.  Nutrition Plan reviewed with pt. Per nutr screen, pt reports she wants to lose wt. Pt denies currently making any changes to promote wt loss. Pt states she was able to lose down to 182-185 lbs before her heart procedure. Pt feels like she will lose wt when she is able to return to her normal exercise routine. Weight loss tips reviewed. Pt expressed understanding.  Nutrition Diagnosis   Food-and nutrition-related knowledge deficit related to lack of exposure to information as related to diagnosis of: ? CVD    Obesity related to excessive energy intake as evidenced by a BMI of 30.7  Nutrition RX/ Estimated Daily Nutrition Needs for: wt loss 1200-1700 Kcal, 30-45 gm fat, 9-13 gm sat fat, 1.2-1.7 gm trans-fat, <1500 mg sodium  Nutrition Intervention   Pt's individual nutrition plan including cholesterol goals reviewed with pt.   Benefits of adopting Therapeutic Lifestyle Changes discussed when Medficts reviewed.   Pt to attend the Portion Distortion class   Pt to attend the  ? Nutrition I class                         ? Nutrition II class    Pt given handouts for: ? wt loss   Continue client-centered nutrition education by RD, as part of interdisciplinary care. Goal(s)   Pt to identify food quantities necessary to achieve: ? wt loss to a goal wt of 171-183 lb (77.7-83.2 kg) at graduation from cardiac rehab.    Pt to describe the benefit of including fruits, vegetables, whole grains, and low-fat dairy products in a heart healthy meal plan. Monitor and Evaluate progress toward nutrition goal with team.

## 2012-03-16 ENCOUNTER — Encounter (HOSPITAL_COMMUNITY)
Admission: RE | Admit: 2012-03-16 | Discharge: 2012-03-16 | Disposition: A | Discharge status: Home / Self Care | Payer: BC Managed Care – PPO | Source: Ambulatory Visit | Attending: Cardiology | Admitting: Cardiology

## 2012-03-16 NOTE — Progress Notes (Signed)
[name] 66 y.o. female Nutrition Note Spoke with pt.  Nutrition Survey reviewed with pt. Pt is following Step 1 of the Therapeutic Lifestyle Changes diet. Nutrition Diagnosis   Food-and nutrition-related knowledge deficit related to lack of exposure to information as related to diagnosis of: ? CVD    Obesity related to excessive energy intake as evidenced by a BMI of 30.7  Nutrition RX/ Estimated Daily Nutrition Needs for: wt loss 1200-1700 Kcal, 30-45 gm fat, 9-13 gm sat fat, 1.2-1.7 gm trans-fat, <1500 mg sodium  Nutrition Intervention   Benefits of adopting Therapeutic Lifestyle Changes discussed when Medficts reviewed.   Pt to attend the Portion Distortion class   Pt to attend the  ? Nutrition I class                         ? Nutrition II class   Continue client-centered nutrition education by RD, as part of interdisciplinary care. Goal(s)   Pt to identify food quantities necessary to achieve: ? wt loss to a goal wt of 171-183 lb (77.7-83.2 kg) at graduation from cardiac rehab.    Pt to describe the benefit of including fruits, vegetables, whole grains, and low-fat dairy products in a heart healthy meal plan. Monitor and Evaluate progress toward nutrition goal with team.

## 2012-03-18 ENCOUNTER — Encounter (HOSPITAL_COMMUNITY)
Admission: RE | Admit: 2012-03-18 | Discharge: 2012-03-18 | Disposition: A | Discharge status: Home / Self Care | Payer: BC Managed Care – PPO | Source: Ambulatory Visit | Attending: Cardiology | Admitting: Cardiology

## 2012-03-21 ENCOUNTER — Encounter (HOSPITAL_COMMUNITY)
Admission: RE | Admit: 2012-03-21 | Discharge: 2012-03-21 | Disposition: A | Discharge status: Home / Self Care | Payer: BC Managed Care – PPO | Source: Ambulatory Visit | Attending: Cardiology | Admitting: Cardiology

## 2012-03-23 ENCOUNTER — Encounter (HOSPITAL_COMMUNITY)
Admission: RE | Admit: 2012-03-23 | Discharge: 2012-03-23 | Disposition: A | Discharge status: Home / Self Care | Payer: BC Managed Care – PPO | Source: Ambulatory Visit | Attending: Cardiology | Admitting: Cardiology

## 2012-03-25 ENCOUNTER — Encounter (HOSPITAL_COMMUNITY)
Admission: RE | Admit: 2012-03-25 | Discharge: 2012-03-25 | Disposition: A | Discharge status: Home / Self Care | Payer: BC Managed Care – PPO | Source: Ambulatory Visit | Attending: Cardiology | Admitting: Cardiology

## 2012-03-28 ENCOUNTER — Encounter (HOSPITAL_COMMUNITY)
Admission: RE | Admit: 2012-03-28 | Discharge: 2012-03-28 | Disposition: A | Discharge status: Home / Self Care | Payer: BC Managed Care – PPO | Source: Ambulatory Visit | Attending: Cardiology | Admitting: Cardiology

## 2012-03-30 ENCOUNTER — Encounter (HOSPITAL_COMMUNITY)
Admission: RE | Admit: 2012-03-30 | Discharge: 2012-03-30 | Disposition: A | Discharge status: Home / Self Care | Payer: BC Managed Care – PPO | Source: Ambulatory Visit | Attending: Cardiology | Admitting: Cardiology

## 2012-04-01 ENCOUNTER — Encounter (HOSPITAL_COMMUNITY)
Admission: RE | Admit: 2012-04-01 | Discharge: 2012-04-01 | Disposition: A | Discharge status: Home / Self Care | Payer: BC Managed Care – PPO | Source: Ambulatory Visit | Attending: Cardiology | Admitting: Cardiology

## 2012-04-04 ENCOUNTER — Encounter (HOSPITAL_COMMUNITY): Payer: BC Managed Care – PPO

## 2012-04-06 ENCOUNTER — Encounter (HOSPITAL_COMMUNITY): Payer: BC Managed Care – PPO

## 2012-04-08 ENCOUNTER — Encounter (HOSPITAL_COMMUNITY): Payer: BC Managed Care – PPO

## 2012-04-11 ENCOUNTER — Encounter (HOSPITAL_COMMUNITY): Payer: BC Managed Care – PPO

## 2012-04-13 ENCOUNTER — Encounter (HOSPITAL_COMMUNITY): Payer: BC Managed Care – PPO

## 2012-04-15 ENCOUNTER — Encounter (HOSPITAL_COMMUNITY): Payer: BC Managed Care – PPO

## 2012-04-18 ENCOUNTER — Encounter (HOSPITAL_COMMUNITY)
Admission: RE | Admit: 2012-04-18 | Discharge: 2012-04-18 | Disposition: A | Discharge status: Home / Self Care | Payer: BC Managed Care – PPO | Source: Ambulatory Visit | Attending: Cardiology | Admitting: Cardiology

## 2012-04-18 DIAGNOSIS — Z9861 Coronary angioplasty status: Secondary | ICD-10-CM | POA: Insufficient documentation

## 2012-04-18 DIAGNOSIS — I2510 Atherosclerotic heart disease of native coronary artery without angina pectoris: Secondary | ICD-10-CM | POA: Insufficient documentation

## 2012-04-18 DIAGNOSIS — Z5189 Encounter for other specified aftercare: Secondary | ICD-10-CM | POA: Insufficient documentation

## 2012-04-18 DIAGNOSIS — E785 Hyperlipidemia, unspecified: Secondary | ICD-10-CM | POA: Insufficient documentation

## 2012-04-18 DIAGNOSIS — Z951 Presence of aortocoronary bypass graft: Secondary | ICD-10-CM | POA: Insufficient documentation

## 2012-04-18 DIAGNOSIS — E669 Obesity, unspecified: Secondary | ICD-10-CM | POA: Insufficient documentation

## 2012-04-18 DIAGNOSIS — I10 Essential (primary) hypertension: Secondary | ICD-10-CM | POA: Insufficient documentation

## 2012-04-18 DIAGNOSIS — R011 Cardiac murmur, unspecified: Secondary | ICD-10-CM | POA: Insufficient documentation

## 2012-04-20 ENCOUNTER — Encounter (HOSPITAL_COMMUNITY)
Admission: RE | Admit: 2012-04-20 | Discharge: 2012-04-20 | Disposition: A | Discharge status: Home / Self Care | Payer: BC Managed Care – PPO | Source: Ambulatory Visit | Attending: Cardiology | Admitting: Cardiology

## 2012-04-22 ENCOUNTER — Encounter (HOSPITAL_COMMUNITY)
Admission: RE | Admit: 2012-04-22 | Discharge: 2012-04-22 | Disposition: A | Discharge status: Home / Self Care | Payer: BC Managed Care – PPO | Source: Ambulatory Visit | Attending: Cardiology | Admitting: Cardiology

## 2012-04-25 ENCOUNTER — Encounter (HOSPITAL_COMMUNITY)
Admission: RE | Admit: 2012-04-25 | Discharge: 2012-04-25 | Disposition: A | Discharge status: Home / Self Care | Payer: BC Managed Care – PPO | Source: Ambulatory Visit | Attending: Cardiology | Admitting: Cardiology

## 2012-04-27 ENCOUNTER — Encounter (HOSPITAL_COMMUNITY)
Admission: RE | Admit: 2012-04-27 | Discharge: 2012-04-27 | Disposition: A | Discharge status: Home / Self Care | Payer: BC Managed Care – PPO | Source: Ambulatory Visit | Attending: Cardiology | Admitting: Cardiology

## 2012-04-29 ENCOUNTER — Encounter (HOSPITAL_COMMUNITY)
Admission: RE | Admit: 2012-04-29 | Discharge: 2012-04-29 | Disposition: A | Discharge status: Home / Self Care | Payer: BC Managed Care – PPO | Source: Ambulatory Visit | Attending: Cardiology | Admitting: Cardiology

## 2012-05-04 ENCOUNTER — Encounter (HOSPITAL_COMMUNITY)
Admission: RE | Admit: 2012-05-04 | Discharge: 2012-05-04 | Disposition: A | Discharge status: Home / Self Care | Payer: BC Managed Care – PPO | Source: Ambulatory Visit | Attending: Cardiology | Admitting: Cardiology

## 2012-05-04 NOTE — Progress Notes (Signed)
Reviewed home exercise with pt today.  Pt plans to walk and attend the [employer] program at the [facility] for exercise.  Reviewed THR, pulse, RPE, sign and symptoms, NTG use, and when to call 911 or MD.  Pt voiced understanding. Electronically signed by Harriett Sine MS on Wednesday May 04 2012 at 1438

## 2012-05-06 ENCOUNTER — Encounter (HOSPITAL_COMMUNITY)
Admission: RE | Admit: 2012-05-06 | Discharge: 2012-05-06 | Disposition: A | Discharge status: Home / Self Care | Payer: BC Managed Care – PPO | Source: Ambulatory Visit | Attending: Cardiology | Admitting: Cardiology

## 2012-05-09 ENCOUNTER — Encounter (HOSPITAL_COMMUNITY)
Admission: RE | Admit: 2012-05-09 | Discharge: 2012-05-09 | Disposition: A | Discharge status: Home / Self Care | Payer: BC Managed Care – PPO | Source: Ambulatory Visit | Attending: Cardiology | Admitting: Cardiology

## 2012-05-09 DIAGNOSIS — R011 Cardiac murmur, unspecified: Secondary | ICD-10-CM | POA: Insufficient documentation

## 2012-05-09 DIAGNOSIS — Z5189 Encounter for other specified aftercare: Secondary | ICD-10-CM | POA: Insufficient documentation

## 2012-05-09 DIAGNOSIS — I2510 Atherosclerotic heart disease of native coronary artery without angina pectoris: Secondary | ICD-10-CM | POA: Insufficient documentation

## 2012-05-09 DIAGNOSIS — Z951 Presence of aortocoronary bypass graft: Secondary | ICD-10-CM | POA: Insufficient documentation

## 2012-05-09 DIAGNOSIS — Z9861 Coronary angioplasty status: Secondary | ICD-10-CM | POA: Insufficient documentation

## 2012-05-09 DIAGNOSIS — E669 Obesity, unspecified: Secondary | ICD-10-CM | POA: Insufficient documentation

## 2012-05-09 DIAGNOSIS — E785 Hyperlipidemia, unspecified: Secondary | ICD-10-CM | POA: Insufficient documentation

## 2012-05-09 DIAGNOSIS — I10 Essential (primary) hypertension: Secondary | ICD-10-CM | POA: Insufficient documentation

## 2012-05-11 ENCOUNTER — Encounter (HOSPITAL_COMMUNITY)
Admission: RE | Admit: 2012-05-11 | Discharge: 2012-05-11 | Disposition: A | Discharge status: Home / Self Care | Payer: BC Managed Care – PPO | Source: Ambulatory Visit | Attending: Cardiology | Admitting: Cardiology

## 2012-05-13 ENCOUNTER — Encounter (HOSPITAL_COMMUNITY)
Admission: RE | Admit: 2012-05-13 | Discharge: 2012-05-13 | Disposition: A | Discharge status: Home / Self Care | Payer: BC Managed Care – PPO | Source: Ambulatory Visit | Attending: Cardiology | Admitting: Cardiology

## 2012-05-16 ENCOUNTER — Encounter (HOSPITAL_COMMUNITY)
Admission: RE | Admit: 2012-05-16 | Discharge: 2012-05-16 | Disposition: A | Discharge status: Home / Self Care | Payer: BC Managed Care – PPO | Source: Ambulatory Visit | Attending: Cardiology | Admitting: Cardiology

## 2012-05-18 ENCOUNTER — Encounter (HOSPITAL_COMMUNITY)
Admission: RE | Admit: 2012-05-18 | Discharge: 2012-05-18 | Disposition: A | Discharge status: Home / Self Care | Payer: BC Managed Care – PPO | Source: Ambulatory Visit | Attending: Cardiology | Admitting: Cardiology

## 2012-05-20 ENCOUNTER — Encounter (HOSPITAL_COMMUNITY)
Admission: RE | Admit: 2012-05-20 | Discharge: 2012-05-20 | Disposition: A | Discharge status: Home / Self Care | Payer: BC Managed Care – PPO | Source: Ambulatory Visit | Attending: Cardiology | Admitting: Cardiology

## 2012-05-23 ENCOUNTER — Encounter (HOSPITAL_COMMUNITY)
Admission: RE | Admit: 2012-05-23 | Discharge: 2012-05-23 | Disposition: A | Discharge status: Home / Self Care | Payer: BC Managed Care – PPO | Source: Ambulatory Visit | Attending: Cardiology | Admitting: Cardiology

## 2012-05-25 ENCOUNTER — Encounter (HOSPITAL_COMMUNITY)
Admission: RE | Admit: 2012-05-25 | Discharge: 2012-05-25 | Disposition: A | Discharge status: Home / Self Care | Payer: BC Managed Care – PPO | Source: Ambulatory Visit | Attending: Cardiology | Admitting: Cardiology

## 2012-05-27 ENCOUNTER — Encounter (HOSPITAL_COMMUNITY)
Admission: RE | Admit: 2012-05-27 | Discharge: 2012-05-27 | Disposition: A | Discharge status: Home / Self Care | Payer: BC Managed Care – PPO | Source: Ambulatory Visit | Attending: Cardiology | Admitting: Cardiology

## 2012-05-30 ENCOUNTER — Encounter (HOSPITAL_COMMUNITY)
Admission: RE | Admit: 2012-05-30 | Discharge: 2012-05-30 | Disposition: A | Discharge status: Home / Self Care | Payer: BC Managed Care – PPO | Source: Ambulatory Visit | Attending: Cardiology | Admitting: Cardiology

## 2012-06-01 ENCOUNTER — Encounter (HOSPITAL_COMMUNITY): Payer: BC Managed Care – PPO

## 2012-06-01 NOTE — Progress Notes (Signed)
Cardiac Rehabilitation Program Outcomes Report Orientation:  02/18/2012 Graduate Date:  05/30/2012 Discharge Date:   # of sessions completed: 36  Cardiologist: Gomez Cleverly MD:  Madilyn Hook Time:  1315  A.  Exercise Program:  Tolerates exercise @ 7.5 METS for 30 minutes, Improved functional capacity  15.11 %, Decreased  muscular strength  3.66 %, No Change  flexibility 0 %, Exercise limited by musculoskeletal problems and Discharged to home exercise program.  Anticipated compliance:  fair  B.  Mental Health:  Quality of Life (QOL)  changes:  Overall  -1.98 %, Health/Functioning -0.77 %, Socioeconomics -2.06 %, Psych/Spiritual -1.36 %, Family -5.00 %    C.  Education/Instruction/Skills  Accurately checks own pulse, Knows THR for exercise, Uses Perceived Exertion Scale and Attended 4 education classes    D.  Nutrition/Weight Control/Body Composition:  % Body Fat  41.5 and Patient has lost 0.1 kg  BMI 30.6, Pt following a step 2 Therapeutic Lifestyle Changes diet.  Section Completed by: Mickle Plumb, M.Ed, RD, LDN, CDE  E.  Blood Lipids  No results found for this basename: CHOL, HDL, LDLCALC, LDLDIRECT, TRIG, CHOLHDL   F.  Lifestyle Changes:   G.  Symptoms noted with exercise:  Musculoskeletal problems, Fatigue, Resting hypertension and Exertional hypertension  Report Completed By:  Electronically signed by Harriett Sine MS on Wednesday June 01 2012 at 0804  Comments:    Pt successfully completed cardiac rehab attending 36/36 exercise and 4 education sessions. Pt had good participation.   Pt VSS, Telemetry-NSR, rare PAC.  Pt METS increased from 2.4 at baseline to 7.5 upon completion of program. Pt plans to exercise on her own.  Pt did not have hospital admission during cardiac rehab period.  Pt has made positive lifestyle changes and should be commended for her efforts.  Pt was a pleasure to work with.  Thank you for the referral.

## 2012-06-03 ENCOUNTER — Encounter (HOSPITAL_COMMUNITY): Payer: BC Managed Care – PPO

## 2012-06-06 ENCOUNTER — Encounter (HOSPITAL_COMMUNITY): Payer: BC Managed Care – PPO

## 2012-06-08 ENCOUNTER — Encounter (HOSPITAL_COMMUNITY): Payer: BC Managed Care – PPO

## 2012-06-10 ENCOUNTER — Encounter (HOSPITAL_COMMUNITY): Payer: BC Managed Care – PPO

## 2012-06-13 ENCOUNTER — Encounter (HOSPITAL_COMMUNITY): Payer: BC Managed Care – PPO

## 2012-06-15 ENCOUNTER — Encounter (HOSPITAL_COMMUNITY): Payer: BC Managed Care – PPO

## 2012-06-17 ENCOUNTER — Encounter (HOSPITAL_COMMUNITY): Payer: BC Managed Care – PPO

## 2012-06-20 ENCOUNTER — Encounter (HOSPITAL_COMMUNITY): Payer: BC Managed Care – PPO

## 2012-06-22 ENCOUNTER — Encounter (HOSPITAL_COMMUNITY): Payer: BC Managed Care – PPO

## 2012-06-24 ENCOUNTER — Encounter (HOSPITAL_COMMUNITY): Payer: BC Managed Care – PPO

## 2013-10-06 ENCOUNTER — Telehealth: Payer: Self-pay | Admitting: Cardiology

## 2013-10-06 NOTE — Telephone Encounter (Signed)
New message   Pt thinks she is due for lab work Monday---Pls check and let pt know if she should be fasting for monday

## 2013-10-07 ENCOUNTER — Encounter: Payer: Self-pay | Admitting: Cardiology

## 2013-10-07 DIAGNOSIS — R7303 Prediabetes: Secondary | ICD-10-CM | POA: Insufficient documentation

## 2013-10-07 DIAGNOSIS — I2510 Atherosclerotic heart disease of native coronary artery without angina pectoris: Secondary | ICD-10-CM | POA: Insufficient documentation

## 2013-10-07 DIAGNOSIS — E782 Mixed hyperlipidemia: Secondary | ICD-10-CM | POA: Insufficient documentation

## 2013-10-07 DIAGNOSIS — I10 Essential (primary) hypertension: Secondary | ICD-10-CM | POA: Insufficient documentation

## 2013-10-09 ENCOUNTER — Encounter: Payer: Self-pay | Admitting: Cardiology

## 2013-10-09 ENCOUNTER — Ambulatory Visit (INDEPENDENT_AMBULATORY_CARE_PROVIDER_SITE_OTHER): Payer: Medicare Other | Admitting: Cardiology

## 2013-10-09 VITALS — BP 112/66 | HR 64 | Ht 67.0 in | Wt 204.0 lb

## 2013-10-09 DIAGNOSIS — I10 Essential (primary) hypertension: Secondary | ICD-10-CM

## 2013-10-09 DIAGNOSIS — E782 Mixed hyperlipidemia: Secondary | ICD-10-CM

## 2013-10-09 DIAGNOSIS — I2510 Atherosclerotic heart disease of native coronary artery without angina pectoris: Secondary | ICD-10-CM

## 2013-10-09 HISTORY — DX: Essential (primary) hypertension: I10

## 2013-10-09 NOTE — Patient Instructions (Signed)
Your physician wants you to follow-up in: 1 YEAR WITH DR. [name] You will receive a reminder letter in the mail two months in advance. If you don't receive a letter, please call our office to schedule the follow-up appointment.  Your physician recommends that you continue on your current medications as directed. Please refer to the Current Medication list given to you today.  2 WEEKS AFTER YOU HAVE STOPPED YOUR SIMVASTATIN, PLEASE CALL OUR OFFICE TO GIVE DR. Judd Gaudier NURSE AN UPDATE ON HOW YOU FEEL. [phone]

## 2013-10-09 NOTE — Progress Notes (Signed)
[address] [place], [place]  [address] Phone: [phone] Fax:  [phone]  Date:  10/09/2013   ID:  [name], DOB [birth year], MRN [id number]  PCP:  Joycelyn Rua, MD   History of Present Illness: [name] is a 67 y.o. female  with coronary artery disease status post bypass surgery 2/12 X 4, LIMA to LAD patent, but other vein grafts occluded, status post circumflex stent in March of 2013 DES.   Prior symptoms included walking with dog and had chest pain across chest wall.  She stopped Lipitor because of achiness. Now feels better but cholesterol poor. See labs. Started simvastatin back, she says she did not have a problem with this previously. now however she feels as though she may have some joint diffuse achiness with simvastatin. We discussed a trial run of discontinuation and resumption necessary. We also discussed the Crestor is a possibility.  Taking lasix over the past 3 days. 20mg . First day urinated a lot. Now not as much. No edema. Exercising again.     Wt Readings from Last 3 Encounters:  10/09/13 204 lb (92.534 kg)  02/18/12 195 lb 12.3 oz (88.8 kg)  02/06/12 200 lb 6.4 oz (90.9 kg)     Past Medical History  Diagnosis Date  . CAD (coronary artery disease)     , s/p CAGB 01/2011- LIMA to LAD patent, all other vein grafts occluded. Circumflex DES 3/13 placed following lateral ischemia on nuclear stress test  . Hypertension   . Allergic rhinitis   . Disc herniation     L4-L5, L5-S1  . Cervical spondylosis     , bulging  discs  . Postmenopausal   . Prediabetes   . Mixed hyperlipidemia   . Obesity     Past Surgical History  Procedure Laterality Date  . Replacement total knee Right   . Partial hysterectomy    . Tubal ligation Bilateral   . Tonsillectomy    . Breast enhancement surgery    . Breast biopsy    . Left bunionectomy    . Bone spur      , Left Foot  . Colonoscopy    . Breast implant exchange     replaced rupture implant  . Coronary artery bypass graft  Current Outpatient Prescriptions  Medication Sig Dispense Refill  . aspirin 81 MG tablet Take 81 mg by mouth daily.      Marland Kitchen atenolol (TENORMIN) 50 MG tablet Take 50 mg by mouth daily.      Marland Kitchen CALCIUM PO Take by mouth daily.      . furosemide (LASIX) 20 MG tablet Take 20 mg by mouth daily. For edema      . nitroGLYCERIN (NITROSTAT) 0.4 MG SL tablet Place 0.4 mg under the tongue every 5 (five) minutes as needed for chest pain.      . potassium gluconate 595 MG TABS tablet Take 595 mg by mouth daily.      . simvastatin (ZOCOR) 40 MG tablet Take 40 mg by mouth every evening.       No current facility-administered medications for this visit.    Allergies:    Allergies  Allergen Reactions  . Lipitor [Atorvastatin]   . Lisinopril Swelling and Cough  . Norvasc [Amlodipine Besylate] Other (See Comments)    Edema   . Tetanus Toxoids   . Latex Rash  . Penicillins Rash  . Sulfa Antibiotics Rash  . Tape Rash    Social History:  The patient  reports that she has never smoked. She does not have any smokeless tobacco history on file. She reports that she drinks alcohol. She reports that she does not use illicit drugs.   ROS:  Please see the history of present illness.   Denies any syncope, bleeding, orthopnea, PND. She did have a recent fall due to clumsiness while playing with her grandchildren she states.    PHYSICAL EXAM: VS:  BP 112/66  Pulse 64  Ht 5\' 7"  (1.702 m)  Wt 204 lb (92.534 kg)  BMI 31.94 kg/m2 Well nourished, well developed, in no acute distress HEENT: normal Neck: no JVD Cardiac:  normal S1, S2; RRR; no murmur Lungs:  clear to auscultation bilaterally, no wheezing,  rhonchi or rales Abd: soft, nontender, no hepatomegaly Ext: no edema Skin: warm and dry Neuro: no focal abnormalities noted  EKG:  Sinus rhythm, 64, no changes   ASSESSMENT AND PLAN:  1. Coronary artery disease-status post bypass. No anginal symptoms. Doing very well. 2. Fluid retention-mild. She has been taking Lasix over the past 3 days. Decrease salt, fluid intake. She may not need Lasix long-term. Encouraged her not to take Lasix on a daily basis if possible. 3. Hypertension-very well controlled 4. Hyperlipidemia-currently on simvastatin. Previously I believe on atorvastatin. She thinks that she may be having some aches from simvastatin. I've asked her to stop the medication for 2 weeks and to see how she feels. She will call is back. If she is feeling better, I would ask her to restart the simvastatin to see if her symptoms return. If there is no change, restart simvastatin regardless. If symptoms worsen, we can always switch to Crestor. 5. We will see her back in one year or earlier if symptoms develop   Signed, Donato Schultz, MD [facility]  10/09/2013 1:58 PM

## 2013-10-16 NOTE — Telephone Encounter (Signed)
No number noted for patient, patient last labs where in 06/2013 and Dr. [name] documented that he would repeat in 1 year.

## 2013-10-23 ENCOUNTER — Encounter: Payer: Self-pay | Admitting: Cardiology

## 2013-10-23 ENCOUNTER — Other Ambulatory Visit: Payer: Self-pay | Admitting: Cardiology

## 2013-10-23 NOTE — Telephone Encounter (Signed)
Needs refill atenolol and simvastin to [facility]

## 2013-10-24 ENCOUNTER — Other Ambulatory Visit: Payer: Self-pay | Admitting: Pharmacist

## 2013-10-24 MED ORDER — SIMVASTATIN 40 MG PO TABS: 40.0000 mg | ORAL_TABLET | Freq: Every day | ORAL | Status: DC

## 2013-10-24 MED ORDER — ATENOLOL 50 MG PO TABS: 50.0000 mg | ORAL_TABLET | Freq: Every day | ORAL | Status: DC

## 2014-05-27 ENCOUNTER — Other Ambulatory Visit: Payer: Self-pay | Admitting: *Deleted

## 2014-05-27 DIAGNOSIS — E782 Mixed hyperlipidemia: Secondary | ICD-10-CM

## 2014-05-27 DIAGNOSIS — Z79899 Other long term (current) drug therapy: Secondary | ICD-10-CM

## 2014-06-11 ENCOUNTER — Other Ambulatory Visit (INDEPENDENT_AMBULATORY_CARE_PROVIDER_SITE_OTHER): Payer: Medicare Other

## 2014-06-11 DIAGNOSIS — E782 Mixed hyperlipidemia: Secondary | ICD-10-CM

## 2014-06-11 DIAGNOSIS — Z79899 Other long term (current) drug therapy: Secondary | ICD-10-CM

## 2014-06-11 LAB — LIPID PANEL
CHOL/HDL RATIO: 4
CHOLESTEROL: 147 mg/dL (ref 0–200)
HDL: 41.4 mg/dL (ref >39.00)
LDL CALC: 76 mg/dL (ref 0–99)
NONHDL: 105.6
Triglycerides: 146 mg/dL (ref 0.0–149.0)
VLDL: 29.2 mg/dL (ref 0.0–40.0)

## 2014-06-11 LAB — HEPATIC FUNCTION PANEL
ALT: 34 U/L (ref 0–35)
AST: 31 U/L (ref 0–37)
Albumin: 4.1 g/dL (ref 3.5–5.2)
Alkaline Phosphatase: 30 U/L — ABNORMAL LOW (ref 39–117)
BILIRUBIN DIRECT: 0 mg/dL (ref 0.0–0.3)
BILIRUBIN TOTAL: 0.5 mg/dL (ref 0.2–1.2)
Total Protein: 7.6 g/dL (ref 6.0–8.3)

## 2014-06-15 ENCOUNTER — Telehealth: Payer: Self-pay | Admitting: Cardiology

## 2014-06-15 DIAGNOSIS — E782 Mixed hyperlipidemia: Secondary | ICD-10-CM

## 2014-06-15 NOTE — Telephone Encounter (Signed)
New message ° ° ° ° °Want lab results °

## 2014-06-15 NOTE — Telephone Encounter (Signed)
Pt notified of lipid/hepatic results. 1 year f/u labs ordered and sheduled. FYI to [facility].

## 2014-07-04 ENCOUNTER — Encounter: Payer: Self-pay | Admitting: Cardiology

## 2014-11-15 ENCOUNTER — Encounter (HOSPITAL_COMMUNITY): Payer: Self-pay | Admitting: Cardiology

## 2014-12-28 ENCOUNTER — Ambulatory Visit: Payer: Medicare Other | Admitting: Cardiology

## 2015-01-17 ENCOUNTER — Ambulatory Visit (INDEPENDENT_AMBULATORY_CARE_PROVIDER_SITE_OTHER): Payer: Medicare Other | Admitting: Cardiology

## 2015-01-17 ENCOUNTER — Encounter: Payer: Self-pay | Admitting: Cardiology

## 2015-01-17 VITALS — BP 120/76 | HR 59 | Ht 67.0 in | Wt 205.0 lb

## 2015-01-17 DIAGNOSIS — I2510 Atherosclerotic heart disease of native coronary artery without angina pectoris: Secondary | ICD-10-CM

## 2015-01-17 DIAGNOSIS — I2583 Coronary atherosclerosis due to lipid rich plaque: Principal | ICD-10-CM

## 2015-01-17 DIAGNOSIS — I10 Essential (primary) hypertension: Secondary | ICD-10-CM

## 2015-01-17 DIAGNOSIS — E782 Mixed hyperlipidemia: Secondary | ICD-10-CM

## 2015-01-17 NOTE — Patient Instructions (Signed)
The current medical regimen is effective;  continue present plan and medications.  Follow up in 1 year with Dr. Skains.  You will receive a letter in the mail 2 months before you are due.  Please call us when you receive this letter to schedule your follow up appointment.  Thank you for choosing [facility]!!     

## 2015-01-17 NOTE — Progress Notes (Signed)
[facility]. [address], [address], [place]  [address] Phone: [phone] Fax:  [phone]  Date:  01/17/2015   ID:  [name], [name] 05/01/1946, MRN [id number]  PCP:  Orpah Melter, MD   History of Present Illness: [name] is a 69 y.o. female  with coronary artery disease status post bypass surgery 2/12 X 4, LIMA to LAD patent, but other vein grafts occluded, status post circumflex stent in March of 2013 DES.   Prior symptoms included walking with dog and had chest pain across chest wall.    overall she is doing well. No anginal symptoms. No significant shortness of breath. She does have knee pain. Prior right knee surgery. She does have difficulty walking because of her knee. She has gained some weight because of her lack of exercise.   Her husband, recently saw Dr. Meda Coffee and had workup for heart failure. He has been referred to cardiac rehabilitation.     Wt Readings from Last 3 Encounters:  01/17/15 205 lb (92.987 kg)  10/09/13 204 lb (92.534 kg)  02/06/12 200 lb 6.4 oz (90.9 kg)     Past Medical History  Diagnosis Date  . CAD (coronary artery disease)     , s/p CAGB 01/2011- LIMA to LAD patent, all other vein grafts occluded. Circumflex DES 3/13 placed following lateral ischemia on nuclear stress test  . Hypertension   . Allergic rhinitis   . Disc herniation     L4-L5, L5-S1  . Cervical spondylosis     , bulging  discs  . Postmenopausal   . Prediabetes   . Mixed hyperlipidemia   . Obesity     Past Surgical History  Procedure Laterality Date  . Replacement total knee Right   . Partial hysterectomy    . Tubal ligation Bilateral   . Tonsillectomy    . Breast enhancement surgery    . Breast biopsy    . Left bunionectomy    . Bone spur      , Left Foot  . Colonoscopy    . Breast implant exchange      replaced rupture implant  . Coronary artery bypass graft    . Left heart catheterization with coronary/graft angiogram  02/05/2012    Procedure: LEFT HEART CATHETERIZATION WITH Beatrix Fetters;  Surgeon: Candee Furbish, MD;  Location: [facility] CATH LAB;  Service: Cardiovascular;;  Current Outpatient Prescriptions  Medication Sig Dispense Refill  . aspirin 81 MG tablet Take 81 mg by mouth daily.    Marland Kitchen atenolol (TENORMIN) 50 MG tablet Take 1 tablet (50 mg total) by mouth daily. 90 tablet 3  . CALCIUM PO Take 2 tablets by mouth daily.     . furosemide (LASIX) 20 MG tablet Take 20 mg by mouth as needed.    . nitroGLYCERIN (NITROSTAT) 0.4 MG SL tablet Place 0.4 mg under the tongue every 5 (five) minutes as needed for chest pain.    . potassium gluconate 595 MG TABS tablet Take 595 mg by mouth daily.    . simvastatin (ZOCOR) 40 MG tablet Take 1 tablet (40 mg total) by mouth daily. 90 tablet 3   No current facility-administered medications for this visit.    Allergies:    Allergies  Allergen Reactions  . Norvasc [Amlodipine Besylate] Other (See Comments)    Edema   . Latex Rash  . Lipitor [Atorvastatin] Other (See Comments)    Pt unsure of what happens  . Lisinopril Swelling and Cough  . Penicillins Rash  . Sulfa Antibiotics Rash  . Tape Rash  . Tetanus Toxoids Other (See Comments)    Patient said "Didn't do well".    Social History:  The patient  reports that she has never smoked. She does not have any smokeless tobacco history on file. She reports that she drinks alcohol. She reports that she does not use illicit drugs.   ROS:  Please see the history of present illness.   Denies any syncope, bleeding, orthopnea, PND. She did have a recent fall due to clumsiness while playing with her grandchildren she states.    PHYSICAL EXAM: VS:  BP 120/76 mmHg  Pulse 59  Ht 5'  7" (1.702 m)  Wt 205 lb (92.987 kg)  BMI 32.10 kg/m2 Well nourished, well developed, in no acute distress HEENT: normal Neck: no JVD Cardiac:  normal S1, S2; RRR; no murmur Lungs:  clear to auscultation bilaterally, no wheezing, rhonchi or rales Abd: soft, nontender, no hepatomegaly Ext: no edema Skin: warm and dry Neuro: no focal abnormalities noted  EKG:   01/17/15-sinus bradycardia rate 59, possible left atrial enlargement, no significant change from prior-Sinus rhythm, 64, no changes   Labs: 06/11/14-LDL 76, ALT 34  ASSESSMENT AND PLAN:  1. Coronary artery disease-status post bypass. No anginal symptoms. Doing very well. 2. Fluid retention-mild. Occasional use of Lasix. Decrease salt, fluid intake.  3. Hypertension-very well controlled 4. Hyperlipidemia-currently on simvastatin. Previously I believe on atorvastatin. She is doing well currently with this medication. No achiness. No symptoms. Her LDL at last check was excellent. We will see her back in one year or earlier if symptoms develop   Signed, Candee Furbish, MD [facility]  01/17/2015 10:06 AM

## 2015-06-12 ENCOUNTER — Other Ambulatory Visit: Payer: Medicare Other

## 2015-12-11 ENCOUNTER — Encounter: Payer: Self-pay | Admitting: Nurse Practitioner

## 2015-12-11 ENCOUNTER — Ambulatory Visit (INDEPENDENT_AMBULATORY_CARE_PROVIDER_SITE_OTHER): Payer: Medicare Other | Admitting: Nurse Practitioner

## 2015-12-11 ENCOUNTER — Other Ambulatory Visit: Payer: Medicare Other

## 2015-12-11 VITALS — BP 136/90 | HR 64 | Ht 67.0 in | Wt 206.8 lb

## 2015-12-11 DIAGNOSIS — R7303 Prediabetes: Secondary | ICD-10-CM

## 2015-12-11 DIAGNOSIS — E782 Mixed hyperlipidemia: Secondary | ICD-10-CM

## 2015-12-11 DIAGNOSIS — I10 Essential (primary) hypertension: Secondary | ICD-10-CM

## 2015-12-11 DIAGNOSIS — R61 Generalized hyperhidrosis: Secondary | ICD-10-CM

## 2015-12-11 DIAGNOSIS — I2510 Atherosclerotic heart disease of native coronary artery without angina pectoris: Secondary | ICD-10-CM

## 2015-12-11 DIAGNOSIS — R5383 Other fatigue: Secondary | ICD-10-CM

## 2015-12-11 DIAGNOSIS — I2583 Coronary atherosclerosis due to lipid rich plaque: Principal | ICD-10-CM

## 2015-12-11 LAB — CBC
HCT: 45.1 % (ref 36.0–46.0)
Hemoglobin: 15.5 g/dL — ABNORMAL HIGH (ref 12.0–15.0)
MCH: 32.5 pg (ref 26.0–34.0)
MCHC: 34.4 g/dL (ref 30.0–36.0)
MCV: 94.5 fL (ref 78.0–100.0)
MPV: 11.1 fL (ref 8.6–12.4)
Platelets: 223 10*3/uL (ref 150–400)
RBC: 4.77 MIL/uL (ref 3.87–5.11)
RDW: 13.2 % (ref 11.5–15.5)
WBC: 7.4 10*3/uL (ref 4.0–10.5)

## 2015-12-11 LAB — BASIC METABOLIC PANEL
BUN: 19 mg/dL (ref 7–25)
CO2: 25 mmol/L (ref 20–31)
Calcium: 9.3 mg/dL (ref 8.6–10.4)
Chloride: 99 mmol/L (ref 98–110)
Creat: 0.87 mg/dL (ref 0.50–0.99)
Glucose, Bld: 104 mg/dL — ABNORMAL HIGH (ref 65–99)
Potassium: 3.9 mmol/L (ref 3.5–5.3)
Sodium: 137 mmol/L (ref 135–146)

## 2015-12-11 LAB — TSH: TSH: 3.189 u[IU]/mL (ref 0.350–4.500)

## 2015-12-11 NOTE — Patient Instructions (Addendum)
We will be checking the following labs today - BMET, CBC, TSH and A1C   Medication Instructions:    Continue with your current medicines.     Testing/Procedures To Be Arranged:  Echocardiogram  Follow-Up:   See Dr. Marlou Porch in February (for recall OV)    Other Special Instructions:   Monitor your BP at home - keep a diary and bring with you to your next OV  Walking daily - start at 5 minutes and then add a minute a day as you are able to     If you need a refill on your cardiac medications before your next appointment, please call your pharmacy.   Call the [place] office at [phone] if you have any questions, problems or concerns.

## 2015-12-11 NOTE — Progress Notes (Signed)
CARDIOLOGY OFFICE NOTE  Date:  12/11/2015    [name] Date of Birth: [birth year] Medical Record [id number]  PCP:  Orpah Melter, MD  Cardiologist:  [facility]    Chief Complaint  Patient presents with  . "Cold sweats"    Work in visit - seen for Dr. Marlou Porch    History of Present Illness: [name] is a 70 y.o. female who presents today for a work in visit. Seen for Dr. Marlou Porch.   She has a history of CAD with prior CABG back in 2012 - LIMA to LAD is patent and all other SVGs are occluded. Had DES to the LCX in 2013. Other issues include HTN, HLD, DM and obesity.   Last seen here in February of last year - felt to be doing ok.   Comes in today. Here alone. She says she is here because 2 nights ago, she woke up and "was completely drenched" in sweat. Later the next day while working at the computer she got sweaty again. No real chest pain but she says sometimes she will "massage" her sternum and she still tries to "guard" her left side. She will have some mild dyspnea with exertion and she admits she tries to not exert. No fever. Weight is up some. No regular exercise. Not really dizzy or lightheaded. Has had recent URI.   Past Medical History  Diagnosis Date  . CAD (coronary artery disease)     , s/p CAGB 01/2011- LIMA to LAD patent, all other vein grafts occluded. Circumflex DES 3/13 placed following lateral ischemia on nuclear stress test  . Hypertension   . Allergic rhinitis   . Disc herniation     L4-L5, L5-S1  . Cervical spondylosis     , bulging  discs  . Postmenopausal   . Prediabetes   . Mixed hyperlipidemia   . Obesity     Past Surgical History  Procedure Laterality Date  . Replacement total knee Right   . Partial hysterectomy    . Tubal ligation Bilateral   . Tonsillectomy    . Breast enhancement surgery    . Breast biopsy    . Left bunionectomy    . Bone spur      , Left Foot  . Colonoscopy    . Breast implant exchange      replaced  rupture implant  . Coronary artery bypass graft    . Left heart catheterization with coronary/graft angiogram  02/05/2012    Procedure: LEFT HEART CATHETERIZATION WITH Beatrix Fetters;  Surgeon: Candee Furbish, MD;  Location[facility] CATH LAB;  Service: Cardiovascular;;     Medications: Current Outpatient Prescriptions  Medication Sig Dispense Refill  . aspirin 81 MG tablet Take 81 mg by mouth daily.    Marland Kitchen atenolol (TENORMIN) 50 MG tablet Take 1 tablet (50 mg total) by mouth daily. 90 tablet 3  . CALCIUM PO Take 2 tablets by mouth daily.     . furosemide (LASIX) 20 MG tablet Take 20 mg by mouth as needed for fluid.     . Potassium Gluconate 550 (90 K) MG TABS Take 550 mg by mouth 2 (two) times daily before a meal.    . potassium gluconate 595 MG TABS tablet Take 550 mg by mouth 2 (two) times daily.     . simvastatin (ZOCOR) 40 MG tablet Take 1 tablet (40 mg total) by mouth daily. 90 tablet 3   No current facility-administered medications for this visit.  Allergies: Allergies  Allergen Reactions  . Norvasc [Amlodipine Besylate] Other (See Comments)    Edema   . Latex Rash  . Lipitor [Atorvastatin] Other (See Comments)    Pt unsure of what happens  . Lisinopril Swelling and Cough  . Penicillins Rash  . Sulfa Antibiotics Rash  . Tape Rash  . Tetanus Toxoids Other (See Comments)    Patient said "Didn't do well".    Social History: The patient  reports that she has never smoked. She does not have any smokeless tobacco history on file. She reports that she drinks alcohol. She reports that she does not use illicit drugs.   Family History: The patient's family history includes Heart attack in her father; Heart disease in her brother, brother, and mother; Hypertension in her mother.   Review of Systems: Please see the history of present illness.   Otherwise, the review of systems is positive for none.   All other systems are reviewed and negative.   Physical Exam: VS:  BP  136/90 mmHg  Pulse 64  Ht 5\' 7"  (1.702 m)  Wt 206 lb 12.8 oz (93.804 kg)  BMI 32.38 kg/m2 .  BMI Body mass index is 32.38 kg/(m^2).  Wt Readings from Last 3 Encounters:  12/11/15 206 lb 12.8 oz (93.804 kg)  01/17/15 205 lb (92.987 kg)  10/09/13 204 lb (92.534 kg)   BP is 150/90 by me.   General: Pleasant. She is obese. She is alert and in no acute distress.  HEENT: Normal. Neck: Supple, no JVD, carotid bruits, or masses noted.  Cardiac: Regular rate and rhythm. Outflow murmur noted. No edema.  Respiratory:  Lungs are clear to auscultation bilaterally with normal work of breathing.  GI: Soft and nontender.  MS: No deformity or atrophy. Gait and ROM intact. Skin: Warm and dry. Color is normal.  Neuro:  Strength and sensation are intact and no gross focal deficits noted.  Psych: Alert, appropriate and with normal affect.   LABORATORY DATA:  EKG:  EKG is ordered today. This demonstrates NSR and is unchanged.  Lab Results  Component Value Date   WBC 9.0 02/06/2012   HGB 14.0 02/06/2012   HCT 42.5 02/06/2012   PLT 203 02/06/2012   GLUCOSE 152* 02/06/2012   CHOL 147 06/11/2014   TRIG 146.0 06/11/2014   HDL 41.40 06/11/2014   LDLCALC 76 06/11/2014   ALT 34 06/11/2014   AST 31 06/11/2014   NA 138 02/06/2012   K 3.7 02/06/2012   CL 102 02/06/2012   CREATININE 0.74 02/06/2012   BUN 13 02/06/2012   CO2 27 02/06/2012   INR 1.39 01/27/2011   HGBA1C  01/23/2011    5.4 (NOTE)                                                                       According to the ADA Clinical Practice Recommendations for 2011, when HbA1c is used as a screening test:   >=6.5%   Diagnostic of Diabetes Mellitus           (if abnormal result  is confirmed)  5.7-6.4%   Increased risk of developing Diabetes Mellitus  References:Diagnosis and Classification of Diabetes Mellitus,Diabetes [id number] 1):S62-S69 and Standards of Medical Care in  Diabetes - 2011,Diabetes Care,2011,34  (Suppl  1):S11-S61.    BNP (last 3 results) No results for input(s): BNP in the last 8760 hours.  ProBNP (last 3 results) No results for input(s): PROBNP in the last 8760 hours.   Other Studies Reviewed Today:   Assessment/Plan: 1. 2 Episodes of "sweats" - not sure what to make of this - could be hormones, thyroid, blood sugar, virus, etc. Will check some labs. Further disposition to follow.   2. Known CAD with prior CABG and has patent LIMA to LAD; SVGs occluded; DES to LCX in 2013 following abnormal Myoview.   3. HTN - BP most likely not at goal - advised to start monitoring at home - bring readings to her next OV  4. HLD - on statin  5. Obesity - understands the need to be working on her risk factors. Advised to start walking 5 minutes a day and increase by a minute each day as tolerated.  6. Borderline DM - check A1C  7. Murmur - will get echo and follow up with Dr. Marlou Porch. She is due for recall with him next month.  Current medicines are reviewed with the patient today.  The patient does not have concerns regarding medicines other than what has been noted above.  The following changes have been made:  See above.  Labs/ tests ordered today include:    Orders Placed This Encounter  Procedures  . Basic metabolic panel  . CBC  . Hemoglobin A1c  . TSH  . EKG 12-Lead  . ECHOCARDIOGRAM COMPLETE     Disposition:   FU with Dr. Marlou Porch next month.  Patient is agreeable to this plan and will call if any problems develop in the interim.   Signed: Burtis Junes, RN, ANP-C 12/11/2015 2:30 PM  [facility] [address] [place],   [address] Phone: [phone] Fax: [phone]

## 2015-12-12 LAB — HEMOGLOBIN A1C
Hgb A1c MFr Bld: 6.1 % — ABNORMAL HIGH (ref <5.7)
Mean Plasma Glucose: 128 mg/dL — ABNORMAL HIGH (ref <117)

## 2015-12-19 ENCOUNTER — Other Ambulatory Visit: Payer: Self-pay

## 2015-12-19 ENCOUNTER — Ambulatory Visit (HOSPITAL_COMMUNITY): Payer: Medicare Other | Attending: Cardiology

## 2015-12-19 DIAGNOSIS — I071 Rheumatic tricuspid insufficiency: Secondary | ICD-10-CM | POA: Insufficient documentation

## 2015-12-19 DIAGNOSIS — Z8249 Family history of ischemic heart disease and other diseases of the circulatory system: Secondary | ICD-10-CM | POA: Diagnosis not present

## 2015-12-19 DIAGNOSIS — E119 Type 2 diabetes mellitus without complications: Secondary | ICD-10-CM | POA: Diagnosis not present

## 2015-12-19 DIAGNOSIS — R011 Cardiac murmur, unspecified: Secondary | ICD-10-CM | POA: Insufficient documentation

## 2015-12-19 DIAGNOSIS — E785 Hyperlipidemia, unspecified: Secondary | ICD-10-CM | POA: Insufficient documentation

## 2015-12-19 DIAGNOSIS — I2510 Atherosclerotic heart disease of native coronary artery without angina pectoris: Secondary | ICD-10-CM

## 2015-12-19 DIAGNOSIS — I517 Cardiomegaly: Secondary | ICD-10-CM | POA: Diagnosis not present

## 2015-12-19 DIAGNOSIS — I10 Essential (primary) hypertension: Secondary | ICD-10-CM | POA: Insufficient documentation

## 2015-12-19 DIAGNOSIS — Z951 Presence of aortocoronary bypass graft: Secondary | ICD-10-CM | POA: Diagnosis not present

## 2015-12-19 DIAGNOSIS — I5189 Other ill-defined heart diseases: Secondary | ICD-10-CM | POA: Diagnosis not present

## 2015-12-19 DIAGNOSIS — I2583 Coronary atherosclerosis due to lipid rich plaque: Secondary | ICD-10-CM

## 2015-12-19 DIAGNOSIS — R7303 Prediabetes: Secondary | ICD-10-CM

## 2015-12-19 DIAGNOSIS — R61 Generalized hyperhidrosis: Secondary | ICD-10-CM

## 2015-12-19 DIAGNOSIS — R5383 Other fatigue: Secondary | ICD-10-CM

## 2015-12-19 DIAGNOSIS — E782 Mixed hyperlipidemia: Secondary | ICD-10-CM

## 2015-12-25 ENCOUNTER — Telehealth: Payer: Self-pay | Admitting: Cardiology

## 2015-12-25 NOTE — Telephone Encounter (Signed)
New problem   Pt returning call concerning results.

## 2015-12-25 NOTE — Telephone Encounter (Signed)
Pt aware of echo results.  She states she will be out of town and not able to have blood work until she comes back in to see Dr Marlou Porch 2/10.

## 2016-01-17 ENCOUNTER — Ambulatory Visit (INDEPENDENT_AMBULATORY_CARE_PROVIDER_SITE_OTHER): Payer: Medicare Other | Admitting: Cardiology

## 2016-01-17 ENCOUNTER — Encounter: Payer: Self-pay | Admitting: Cardiology

## 2016-01-17 VITALS — BP 134/80 | HR 48 | Ht 66.75 in | Wt 202.4 lb

## 2016-01-17 DIAGNOSIS — E782 Mixed hyperlipidemia: Secondary | ICD-10-CM

## 2016-01-17 DIAGNOSIS — I10 Essential (primary) hypertension: Secondary | ICD-10-CM

## 2016-01-17 DIAGNOSIS — I2510 Atherosclerotic heart disease of native coronary artery without angina pectoris: Secondary | ICD-10-CM | POA: Diagnosis not present

## 2016-01-17 DIAGNOSIS — I2583 Coronary atherosclerosis due to lipid rich plaque: Principal | ICD-10-CM

## 2016-01-17 NOTE — Patient Instructions (Signed)

## 2016-01-17 NOTE — Progress Notes (Signed)
[facility]. [name], [place], [name]  [address] Phone: [phone] Fax:  [phone]  Date:  01/17/2016   ID:  [name] 11/11/1946, MRN [id number]  PCP:  Orpah Melter, MD   History of Present Illness: [name] is a 70 y.o. female  with coronary artery disease status post bypass surgery 2/12 X 4, LIMA to LAD patent, but other vein grafts occluded, status post circumflex stent in March of 2013 DES.   Seen by Truitt Merle on 12/11/15 with complaint of cold sweats, completely drenched in sweat. Mild dyspnea. She also had the intestinal flu when visiting her son in [place]. She is now feeling better. With the Lasix 20 mg once a day her hands feel better. She is doing well.   Prior symptoms included walking with dog and had chest pain across chest wall.    Her husband, previously saw Dr. Meda Coffee and had workup for heart failure. He has been referred to cardiac rehabilitation.     Wt Readings from Last 3 Encounters:  01/17/16 202 lb 6.4 oz (91.808 kg)  12/11/15 206 lb 12.8 oz (93.804 kg)  01/17/15 205 lb (92.987 kg)     Past Medical History  Diagnosis Date  . CAD (coronary artery disease)     , s/p CAGB 01/2011- LIMA to LAD patent, all other vein grafts occluded. Circumflex DES 3/13 placed following lateral ischemia on nuclear stress test  . Hypertension   . Allergic rhinitis   . Disc herniation     L4-L5, L5-S1  . Cervical spondylosis     , bulging  discs  . Postmenopausal   . Prediabetes   . Mixed hyperlipidemia   . Obesity     Past Surgical History  Procedure Laterality Date  . Replacement total knee Right   . Partial hysterectomy    . Tubal ligation Bilateral   . Tonsillectomy    . Breast enhancement surgery    . Breast biopsy    . Left bunionectomy    . Bone spur      , Left Foot  . Colonoscopy    . Breast implant exchange      replaced rupture implant  . Coronary artery bypass graft    . Left heart catheterization  with coronary/graft angiogram  02/05/2012    Procedure: LEFT HEART CATHETERIZATION WITH Beatrix Fetters;  Surgeon: Candee Furbish, MD;  Location: [facility] CATH LAB;  Service: Cardiovascular;;  Current Outpatient Prescriptions  Medication Sig Dispense Refill  . aspirin 81 MG tablet Take 81 mg by mouth daily.    Marland Kitchen atenolol (TENORMIN) 50 MG tablet Take 1 tablet (50 mg total) by mouth daily. 90 tablet 3  . CALCIUM PO Take 600 mg by mouth daily.     . furosemide (LASIX) 20 MG tablet Take 20 mg by mouth daily.     . Potassium Gluconate 550 (90 K) MG TABS Take 550 mg by mouth 2 (two) times daily before a meal.    . simvastatin (ZOCOR) 40 MG tablet Take 1 tablet (40 mg total) by mouth daily. 90 tablet 3   No current facility-administered medications for this visit.    Allergies:    Allergies  Allergen Reactions  . Norvasc [Amlodipine Besylate] Other (See Comments)    Edema   . Latex Rash  . Lipitor [Atorvastatin] Other (See Comments)    Pt unsure of what happens  . Lisinopril Swelling and Cough  . Penicillins Rash  . Sulfa Antibiotics Rash  . Tape Rash  . Tetanus Toxoids Other (See Comments)    Patient said "Didn't do well".    Social History:  The patient  reports that she has never smoked. She does not have any smokeless tobacco history on file. She reports that she drinks alcohol. She reports that she does not use illicit drugs.   ROS:  Please see the history of present illness.   Denies any syncope, bleeding, orthopnea, PND. She did have a recent fall due to clumsiness while playing with her grandchildren she states.    PHYSICAL EXAM: VS:  BP 134/80 mmHg  Pulse 48  Ht 5' 6.75" (1.695 m)  Wt 202 lb 6.4 oz (91.808 kg)  BMI 31.96  kg/m2 Well nourished, well developed, in no acute distress HEENT: normal Neck: no JVD Cardiac:  normal S1, S2; RRR; soft systolic murmur Lungs:  clear to auscultation bilaterally, no wheezing, rhonchi or rales Abd: soft, nontender, no hepatomegaly Ext: no edema Skin: warm and dry Neuro: no focal abnormalities noted  EKG:   01/17/15-sinus bradycardia rate 59, possible left atrial enlargement, no significant change from prior-Sinus rhythm, 64, no changes   Echocardiogram 12/19/15: Normal ejection fraction, grade 2 diastolic dysfunction  Labs: 06/11/14-LDL 76, ALT 34  ASSESSMENT AND PLAN:  1. Coronary artery disease-status post bypass. No anginal symptoms. Doing very well. 2. Fluid retention-mild. Consistent with diastolic dysfunction. Explained echocardiogram. Lasix. Decrease salt, fluid intake. Renal function stable. 3. Hypertension-very well controlled 4. Hyperlipidemia-currently on simvastatin. Previously I believe on atorvastatin. She is doing well currently with this medication. No achiness. No symptoms. Her LDL at last check was excellent.  5. We will see her back in 6 months.   Signed, Candee Furbish, MD [facility]  01/17/2016 11:05 AM

## 2016-01-20 ENCOUNTER — Other Ambulatory Visit: Payer: Self-pay | Admitting: Cardiology

## 2016-02-21 ENCOUNTER — Encounter: Payer: Self-pay | Admitting: Cardiovascular Disease

## 2016-02-21 ENCOUNTER — Telehealth: Payer: Self-pay | Admitting: Cardiology

## 2016-02-21 NOTE — Telephone Encounter (Signed)
Left message for patient to call back. I cannot find where she needs lab work.

## 2016-02-21 NOTE — Telephone Encounter (Signed)
New message      Pt need order put in computer to have labs drawn.  Please let her know when the order is in

## 2016-02-24 NOTE — Telephone Encounter (Signed)
error 

## 2016-02-24 NOTE — Telephone Encounter (Signed)
Left message for pt that the only lab order that I can see is to have a BMP.  She was instructed 1/18 to have a BMP in 1 week after increasing her Lasix.  It appears this has not been done as of this time.  Requested she call back to schedule the lab work.

## 2016-02-25 ENCOUNTER — Other Ambulatory Visit (INDEPENDENT_AMBULATORY_CARE_PROVIDER_SITE_OTHER): Payer: Medicare Other | Admitting: *Deleted

## 2016-02-25 DIAGNOSIS — I10 Essential (primary) hypertension: Secondary | ICD-10-CM

## 2016-02-26 LAB — BASIC METABOLIC PANEL
BUN: 13 mg/dL (ref 7–25)
CHLORIDE: 97 mmol/L — AB (ref 98–110)
CO2: 25 mmol/L (ref 20–31)
Calcium: 9 mg/dL (ref 8.6–10.4)
Creat: 0.77 mg/dL (ref 0.50–0.99)
Glucose, Bld: 103 mg/dL — ABNORMAL HIGH (ref 65–99)
POTASSIUM: 3.9 mmol/L (ref 3.5–5.3)
Sodium: 139 mmol/L (ref 135–146)

## 2016-06-17 ENCOUNTER — Encounter: Payer: Self-pay | Admitting: Cardiology

## 2016-06-25 ENCOUNTER — Telehealth: Payer: Self-pay | Admitting: Cardiology

## 2016-06-25 NOTE — Telephone Encounter (Signed)
Left message at [facility] that consultation request form cannot be located so fax may not have been received. Requested form faxed to [phone]. Informed them that paperwork will be reviewed and faxed as soon as possible once received. Left office phone number for further questions.

## 2016-06-25 NOTE — Telephone Encounter (Signed)
New message    [name] states she is waiting to get consultation request form. Does pt require meds before dental procedure? Please fax forms to Dr. Bobby Rumpf at [phone].

## 2016-06-26 NOTE — Telephone Encounter (Signed)
Consultation form from [facility] is in Dr. Marlou Porch mailbox.  Dr. Marlou Porch will return to office on Tuesday.

## 2016-07-01 ENCOUNTER — Telehealth: Payer: Self-pay | Admitting: Cardiology

## 2016-07-01 NOTE — Telephone Encounter (Signed)
July 01, 2016        8:18 AM  Loren Racer, LPN routed this conversation to Me  Rodman Key, RN      2:58 PM  Note    Signed consultation by Dr. Marlou Porch stating no abx needed placed in nurse fax bin in medical records to be faxed.

## 2016-07-01 NOTE — Telephone Encounter (Signed)
F/u  [facility] is calling again to find out if pt will be cleared for dental procedure. Please call.

## 2016-07-01 NOTE — Telephone Encounter (Signed)
Signed consultation by Dr. Marlou Porch stating no abx needed placed in nurse fax bin in medical records to be faxed.

## 2016-07-01 NOTE — Telephone Encounter (Signed)
New message    Wants to know when the pt will be cleared in order to continue with dental procedure. [facility] have been corresponding with the nurse per [place].

## 2016-07-03 ENCOUNTER — Encounter: Payer: Self-pay | Admitting: *Deleted

## 2016-07-15 ENCOUNTER — Ambulatory Visit: Payer: Medicare Other | Admitting: Cardiology

## 2016-07-22 ENCOUNTER — Ambulatory Visit (INDEPENDENT_AMBULATORY_CARE_PROVIDER_SITE_OTHER): Payer: Medicare Other | Admitting: Cardiology

## 2016-07-22 ENCOUNTER — Encounter: Payer: Self-pay | Admitting: Cardiology

## 2016-07-22 VITALS — BP 114/80 | HR 64 | Ht 66.0 in | Wt 202.8 lb

## 2016-07-22 DIAGNOSIS — E782 Mixed hyperlipidemia: Secondary | ICD-10-CM | POA: Diagnosis not present

## 2016-07-22 DIAGNOSIS — I10 Essential (primary) hypertension: Secondary | ICD-10-CM

## 2016-07-22 DIAGNOSIS — I2583 Coronary atherosclerosis due to lipid rich plaque: Principal | ICD-10-CM

## 2016-07-22 DIAGNOSIS — I2510 Atherosclerotic heart disease of native coronary artery without angina pectoris: Secondary | ICD-10-CM | POA: Diagnosis not present

## 2016-07-22 NOTE — Progress Notes (Signed)
[facility]. [address], [address],   [address] Phone: [phone] Fax:  [phone]  Date:  07/22/2016   ID:  [name], DOB 17-Mar-1946, MRN [id number]  PCP:  Orpah Melter, MD   History of Present Illness: [name] is a 70 y.o. female  with coronary artery disease status post bypass surgery 2/12 X 4, LIMA to LAD patent, but other vein grafts occluded, status post circumflex stent in March of 2013 DES.   Seen by Truitt Merle on 12/11/15 with complaint of cold sweats, completely drenched in sweat. Mild dyspnea. She also had the intestinal flu when visiting her son in [place]. She is now feeling better. With the Lasix 20 mg once a day her hands feel better. She is doing well.   Prior symptoms included walking with dog and had chest pain across chest wall.    Her husband, previously saw Dr. Meda Coffee and had workup for heart failure. He has been referred to cardiac rehabilitation.   She is doing well. Sometimes short of breath when chasing her dog around the house. Taking care of her 28-year-old grandchild. No chest pain. Overall very pleased. No problems with her medications.    Wt Readings from Last 3 Encounters:  07/22/16 202 lb 12.8 oz (92 kg)  01/17/16 202 lb 6.4 oz (91.8 kg)  12/11/15 206 lb 12.8 oz (93.8 kg)     Past Medical History:  Diagnosis Date  . Allergic rhinitis   . CAD (coronary artery disease)    , s/p CAGB 01/2011- LIMA to LAD patent, all other vein grafts occluded. Circumflex DES 3/13 placed following lateral ischemia on nuclear stress test  . Cervical spondylosis    , bulging  discs  . Disc herniation    L4-L5, L5-S1  . Hypertension   . Mixed hyperlipidemia   . Obesity   . Postmenopausal   . Prediabetes     Past Surgical History:  Procedure Laterality Date  . Bone Spur Left    Foot  . BREAST BIOPSY    . BREAST ENHANCEMENT SURGERY    . BREAST IMPLANT EXCHANGE     replaced rupture implant  . COLONOSCOPY    .  CORONARY ARTERY BYPASS GRAFT    . Left Bunionectomy    . LEFT HEART CATHETERIZATION WITH CORONARY/GRAFT ANGIOGRAM  02/05/2012   Procedure: LEFT HEART CATHETERIZATION WITH Beatrix Fetters;  Surgeon: Candee Furbish, MD;  Location: [facility] CATH LAB;  Service: Cardiovascular;;  . PARTIAL HYSTERECTOMY    . REPLACEMENT TOTAL KNEE Right   . TONSILLECTOMY    . TUBAL LIGATION Bilateral  Current Outpatient Prescriptions  Medication Sig Dispense Refill  . aspirin 81 MG tablet Take 81 mg by mouth daily.    Marland Kitchen atenolol (TENORMIN) 50 MG tablet Take 1 tablet (50 mg total) by mouth daily. 90 tablet 3  . CALCIUM PO Take 600 mg by mouth daily.     . furosemide (LASIX) 20 MG tablet Take 20 mg by mouth daily.    . Potassium Gluconate 550 (90 K) MG TABS Take 550 mg by mouth 2 (two) times daily before a meal.    . simvastatin (ZOCOR) 40 MG tablet Take 1 tablet (40 mg total) by mouth daily. 90 tablet 3   No current facility-administered medications for this visit.     Allergies:    Allergies  Allergen Reactions  . Norvasc [Amlodipine Besylate] Other (See Comments)    Edema   . Latex Rash  . Lipitor [Atorvastatin] Other (See Comments)    Pt unsure of what happens  . Lisinopril Swelling and Cough  . Penicillins Rash  . Sulfa Antibiotics Rash  . Tape Rash  . Tetanus Toxoids Other (See Comments)    Patient said "Didn't do well".    Social History:  The patient  reports that she has never smoked. She does not have any smokeless tobacco history on file. She reports that she drinks alcohol. She reports that she does not use drugs.   ROS:  Please see the history of present illness.   Denies any syncope, bleeding, orthopnea, PND. She did have a recent fall due  to clumsiness while playing with her grandchildren she states.    PHYSICAL EXAM: VS:  BP 114/80   Pulse 64   Ht 5\' 6"  (1.676 m)   Wt 202 lb 12.8 oz (92 kg)   BMI 32.73 kg/m  Well nourished, well developed, in no acute distress HEENT: normal Neck: no JVD Cardiac:  normal S1, S2; RRR; soft systolic murmur Lungs:  clear to auscultation bilaterally, no wheezing, rhonchi or rales Abd: soft, nontender, no hepatomegaly Ext: no edema Skin: warm and dry Neuro: no focal abnormalities noted  EKG:   01/17/15-sinus bradycardia rate 59, possible left atrial enlargement, no significant change from prior-Sinus rhythm, 64, no changes   Echocardiogram 12/19/15: Normal ejection fraction, grade 2 diastolic dysfunction  Labs: 06/11/14-LDL 76, ALT 34  ASSESSMENT AND PLAN:  1. Coronary artery disease-status post bypass. No anginal symptoms. Doing very well. 2. Fluid retention-mild. Consistent with diastolic dysfunction. Explained echocardiogram. Lasix. She takes this on a daily basis. Decrease salt, fluid intake. Renal function stable. 3. Hypertension-very well controlled 4. Hyperlipidemia-currently on simvastatin. Previously I believe on atorvastatin. She is doing well currently with this medication. No achiness. No symptoms. Her LDL at last check was excellent.  5. From a cardiac perspective she does not require dental antibiotic prophylaxis however I would defer to orthopedic surgery for guidelines on knee replacement. She may require this lifelong. 6. We will see her back in 12 months.   Signed, Candee Furbish, MD [facility]  07/22/2016 9:59 AM

## 2016-07-22 NOTE — Patient Instructions (Signed)

## 2016-08-11 ENCOUNTER — Other Ambulatory Visit: Payer: Self-pay | Admitting: Cardiology

## 2016-08-12 ENCOUNTER — Telehealth: Payer: Self-pay | Admitting: Cardiology

## 2016-08-12 NOTE — Telephone Encounter (Signed)
Spoke with pt and she states that she has enough medication to last her two weeks.  Advised pt once we hear back from Dr. Marlou Porch we will call her with those new orders.  Pt asked that we send in 90 day supply when we get new order.

## 2016-08-12 NOTE — Telephone Encounter (Signed)
Follow Up:; ° ° °Returning your call. °

## 2016-08-12 NOTE — Telephone Encounter (Signed)
New message        Pt c/o medication issue:  1. Name of Medication: atenolol 2. How are you currently taking this medication (dosage and times per day)? 50mg   3. Are you having a reaction (difficulty breathing--STAT)? no  4. What is your medication issue? Pharmacy says it is on Haematologist.  Can you prescribe something else?

## 2016-08-12 NOTE — Telephone Encounter (Signed)
Left message to call back  Will forward to Dr. Marlou Porch for recommendations on changing medication due to Atenolol being on back order.

## 2016-08-17 MED ORDER — METOPROLOL SUCCINATE ER 50 MG PO TB24: 50.0000 mg | ORAL_TABLET | Freq: Every day | ORAL | 3 refills | Status: DC

## 2016-08-17 NOTE — Telephone Encounter (Signed)
Lm for pt that new RX is being sent into her pharmacy as requested and to c/b with any questions or concerns.

## 2016-08-17 NOTE — Telephone Encounter (Signed)
Okay to change to Toprol-XL 50 mg once a day since atenolol 50 mg a day shortage is present. Candee Furbish, MD

## 2017-01-16 DIAGNOSIS — J0100 Acute maxillary sinusitis, unspecified: Secondary | ICD-10-CM | POA: Diagnosis not present

## 2017-02-16 ENCOUNTER — Other Ambulatory Visit: Payer: Self-pay | Admitting: *Deleted

## 2017-02-16 NOTE — Telephone Encounter (Signed)
Patient called and requested a refill on simvastatin. Dr Marlou Porch has not filled this since 2014 and he has not checked lipids since 2015. Patient stated that her pcp may have refilled it but she is changing pcp's. Okay to refill? Please advise. Thanks, MI

## 2017-05-07 ENCOUNTER — Ambulatory Visit (INDEPENDENT_AMBULATORY_CARE_PROVIDER_SITE_OTHER): Payer: Medicare HMO | Admitting: Cardiology

## 2017-05-07 ENCOUNTER — Encounter: Payer: Self-pay | Admitting: Cardiology

## 2017-05-07 VITALS — BP 124/72 | HR 80 | Ht 66.0 in | Wt 210.8 lb

## 2017-05-07 DIAGNOSIS — E782 Mixed hyperlipidemia: Secondary | ICD-10-CM

## 2017-05-07 DIAGNOSIS — I2510 Atherosclerotic heart disease of native coronary artery without angina pectoris: Secondary | ICD-10-CM | POA: Diagnosis not present

## 2017-05-07 DIAGNOSIS — I2583 Coronary atherosclerosis due to lipid rich plaque: Secondary | ICD-10-CM

## 2017-05-07 DIAGNOSIS — I10 Essential (primary) hypertension: Secondary | ICD-10-CM

## 2017-05-07 MED ORDER — ROSUVASTATIN CALCIUM 20 MG PO TABS: 20.0000 mg | ORAL_TABLET | Freq: Every day | ORAL | 3 refills | Status: DC

## 2017-05-07 MED ORDER — ATENOLOL 50 MG PO TABS: 50.0000 mg | ORAL_TABLET | Freq: Every day | ORAL | 3 refills | Status: DC

## 2017-05-07 NOTE — Progress Notes (Signed)
[facility]. [address], [address] [place], [place]  [address] Phone: [phone] Fax:  [phone]  Date:  05/09/2017   ID:  [name], [name] 06-23-1946, MRN [id number]  PCP:  Orpah Melter, MD   History of Present Illness: [name] is a 71 y.o. female  with coronary artery disease status post bypass surgery 2/12 X 4, LIMA to LAD patent, but other vein grafts occluded, status post circumflex stent in March of 2013 DES.   With the Lasix 20 mg once a day her hands feel better.  Prior symptoms included walking with dog and had chest pain across chest wall.   Her husband, previously saw Dr. Meda Coffee and had workup for heart failure. He has participated in cardiac rehabilitation.   She is doing well. Sometimes short of breath when chasing her dog around the house. Taking care of her 70-year-old grandchild.  She has been experiencing some increased ankle edema. No chest pain. She has shortness of breath when going up stairs. She also states this having some trouble breathing sometimes at night when laying down flat she goes to the recliner and she sleeps more upright and feels better. She sometimes wears her husband's compressions. She has gained close to 10 pounds and notes that this is probably from she and her husband started to eat more Asian food with sodium.  Wt Readings from Last 3 Encounters:  05/07/17 210 lb 12.8 oz (95.6 kg)  07/22/16 202 lb 12.8 oz (92 kg)  01/17/16 202 lb 6.4 oz (91.8 kg)     Past Medical History:  Diagnosis Date  . Allergic rhinitis   . CAD (coronary artery disease)    , s/p CAGB 01/2011- LIMA to LAD patent, all other vein grafts occluded. Circumflex DES 3/13 placed following lateral ischemia on nuclear stress test  . Cervical spondylosis    , bulging  discs  . Disc herniation    L4-L5, L5-S1  . Hypertension   . Mixed hyperlipidemia   . Obesity   . Postmenopausal   . Prediabetes     Past Surgical History:  Procedure Laterality Date    . Bone Spur Left    Foot  . BREAST BIOPSY    . BREAST ENHANCEMENT SURGERY    . BREAST IMPLANT EXCHANGE     replaced rupture implant  . COLONOSCOPY    . CORONARY ARTERY BYPASS GRAFT    . Left Bunionectomy    . LEFT HEART CATHETERIZATION WITH CORONARY/GRAFT ANGIOGRAM  02/05/2012   Procedure: LEFT HEART CATHETERIZATION WITH Beatrix Fetters;  Surgeon: Candee Furbish, MD;  Location: [facility] CATH LAB;  Service: Cardiovascular;;  . PARTIAL HYSTERECTOMY    . REPLACEMENT TOTAL KNEE Right   . TONSILLECTOMY    . TUBAL LIGATION Bilateral  Current Outpatient Prescriptions  Medication Sig Dispense Refill  . aspirin 81 MG tablet Take 81 mg by mouth daily.    Marland Kitchen CALCIUM PO Take 1,200 mg by mouth daily.     Marland Kitchen CLINDAMYCIN HCL PO Take by mouth as directed. For dental    . furosemide (LASIX) 20 MG tablet Take 20 mg by mouth daily.    Marland Kitchen ibuprofen (ADVIL,MOTRIN) 200 MG tablet Take 400 mg by mouth every 6 (six) hours as needed (pain).    . Potassium Gluconate 550 (90 K) MG TABS Take 550 mg by mouth 2 (two) times daily before a meal.    . PRESCRIPTION MEDICATION as directed. Pain medication prescribed by orthopedic doctor - unsure of med name / strength    . atenolol (TENORMIN) 50 MG tablet Take 1 tablet (50 mg total) by mouth daily. 90 tablet 3  . rosuvastatin (CRESTOR) 20 MG tablet Take 1 tablet (20 mg total) by mouth daily. 90 tablet 3   No current facility-administered medications for this visit.     Allergies:    Allergies  Allergen Reactions  . Norvasc [Amlodipine Besylate] Other (See Comments)    Edema   . Latex Itching and Rash  . Lipitor [Atorvastatin] Other (See Comments)    Muscle aches  . Lisinopril Swelling and Cough  . Penicillins Rash   . Sulfa Antibiotics Rash  . Tape Itching and Rash  . Tetanus Toxoids Other (See Comments)    Patient said "Didn't do well".    Social History:  The patient  reports that she has never smoked. She has never used smokeless tobacco. She reports that she drinks alcohol. She reports that she does not use drugs.   ROS:  Please see the history of present illness.   Denies any syncope, bleeding, orthopnea, PND. She did have a recent fall due to clumsiness while playing with her grandchildren she states.    PHYSICAL EXAM: VS:  BP 124/72   Pulse 80   Ht 5\' 6"  (1.676 m)   Wt 210 lb 12.8 oz (95.6 kg)   SpO2 97%   BMI 34.02 kg/m  GEN: Well nourished, well developed, in no acute distress  HEENT: normal  Neck: no JVD, carotid bruits, or masses Cardiac: RRR; soft systolic murmurs, no rubs, or gallops,no edema  Respiratory:  clear to auscultation bilaterally, normal work of breathing GI: soft, nontender, nondistended, + BS, overweight MS: no deformity or atrophy  Skin: warm and dry, no rash Neuro:  Alert and Oriented x 3, Strength and sensation are intact Psych: euthymic mood, full affect   EKG:   01/17/15-sinus bradycardia rate 59, possible left atrial enlargement, no significant change from prior-Sinus rhythm, 64, no changes   Echocardiogram 12/19/15: Normal ejection fraction, grade 2 diastolic dysfunction  Labs: prior reviewed  ASSESSMENT AND PLAN:  1. Coronary artery disease-status post bypass. No anginal symptoms. Doing very well. Previously atenolol 50 to Toprol 50 because of medication shortage, She would like to go back to atenolol 50. 2. Fluid retention-mild. She is having some tightness in her ankles. She is wearing compression hose from her husband occasionally. I asked her to increase her Lasix for the next 3 days. Fluid restriction, salt restriction. Consistent with diastolic dysfunction. Reviewed echocardiogram once again. Lasix. She takes this on a daily basis. Decrease salt,  fluid intake. No side effects, doing well. 3. Hypertension-very well controlled, stable. 4. Hyperlipidemia-currently on simvastatin. Previously on atorvastatin and had side effects.  Crestor 20 mg once a day  she would like. We will change.  5. We will see her back in 12 months.   Signed, Candee Furbish, MD [facility]  05/09/2017 6:58 AM

## 2017-05-07 NOTE — Patient Instructions (Signed)
Medication Instructions:  Please stop your Toprol and change to Atenolol 50 mg a day. Stop your Simvastatin and start Crestor 20 mg a day. Double your Lasix for 3 days then return to your normal dose. Continue all other medications as listed.  Follow-Up: Follow up in 6 months with Dr. Marlou Porch.  You will receive a letter in the mail 2 months before you are due.  Please call us when you receive this letter to schedule your follow up appointment.   Any Other Special Instructions Will Be Listed Below (If Applicable). NO Added Salt Diet! Limit fluid intake to 1 liter a day. Work on weight loss (approximate 30 lbs)  Try cutting out all white foods, breads, rice, potatoes.   If you need a refill on your cardiac medications before your next appointment, please call your pharmacy.  Thank you for choosing [facility]!!

## 2017-05-12 ENCOUNTER — Ambulatory Visit: Payer: Medicare HMO | Admitting: Cardiology

## 2017-06-08 DIAGNOSIS — N3001 Acute cystitis with hematuria: Secondary | ICD-10-CM | POA: Diagnosis not present

## 2017-06-14 ENCOUNTER — Other Ambulatory Visit: Payer: Self-pay | Admitting: Cardiology

## 2017-07-04 ENCOUNTER — Encounter: Payer: Self-pay | Admitting: Cardiology

## 2017-08-16 ENCOUNTER — Ambulatory Visit: Payer: Medicare HMO | Admitting: Cardiology

## 2017-08-16 DIAGNOSIS — E782 Mixed hyperlipidemia: Secondary | ICD-10-CM | POA: Diagnosis not present

## 2017-08-16 DIAGNOSIS — E669 Obesity, unspecified: Secondary | ICD-10-CM | POA: Diagnosis not present

## 2017-08-16 DIAGNOSIS — R8290 Unspecified abnormal findings in urine: Secondary | ICD-10-CM | POA: Diagnosis not present

## 2017-08-16 DIAGNOSIS — I2510 Atherosclerotic heart disease of native coronary artery without angina pectoris: Secondary | ICD-10-CM | POA: Diagnosis not present

## 2017-08-16 DIAGNOSIS — Z0000 Encounter for general adult medical examination without abnormal findings: Secondary | ICD-10-CM | POA: Diagnosis not present

## 2017-08-16 DIAGNOSIS — Z1159 Encounter for screening for other viral diseases: Secondary | ICD-10-CM | POA: Diagnosis not present

## 2017-08-16 DIAGNOSIS — M5126 Other intervertebral disc displacement, lumbar region: Secondary | ICD-10-CM | POA: Diagnosis not present

## 2017-08-16 DIAGNOSIS — I10 Essential (primary) hypertension: Secondary | ICD-10-CM | POA: Diagnosis not present

## 2017-08-16 DIAGNOSIS — Z23 Encounter for immunization: Secondary | ICD-10-CM | POA: Diagnosis not present

## 2017-08-16 DIAGNOSIS — R7303 Prediabetes: Secondary | ICD-10-CM | POA: Diagnosis not present

## 2017-09-01 DIAGNOSIS — H40013 Open angle with borderline findings, low risk, bilateral: Secondary | ICD-10-CM | POA: Diagnosis not present

## 2017-09-01 DIAGNOSIS — H2513 Age-related nuclear cataract, bilateral: Secondary | ICD-10-CM | POA: Diagnosis not present

## 2017-09-01 DIAGNOSIS — H25013 Cortical age-related cataract, bilateral: Secondary | ICD-10-CM | POA: Diagnosis not present

## 2017-09-01 DIAGNOSIS — H35033 Hypertensive retinopathy, bilateral: Secondary | ICD-10-CM | POA: Diagnosis not present

## 2017-09-23 DIAGNOSIS — Z0100 Encounter for examination of eyes and vision without abnormal findings: Secondary | ICD-10-CM | POA: Diagnosis not present

## 2017-10-04 ENCOUNTER — Other Ambulatory Visit: Payer: Self-pay

## 2017-10-04 ENCOUNTER — Other Ambulatory Visit: Payer: Self-pay | Admitting: Cardiology

## 2017-10-04 MED ORDER — ROSUVASTATIN CALCIUM 20 MG PO TABS: 20.0000 mg | ORAL_TABLET | Freq: Every day | ORAL | 3 refills | Status: DC

## 2017-10-22 ENCOUNTER — Encounter: Payer: Self-pay | Admitting: Cardiology

## 2017-11-03 ENCOUNTER — Encounter: Payer: Self-pay | Admitting: Cardiology

## 2017-11-03 ENCOUNTER — Ambulatory Visit: Payer: Medicare HMO | Admitting: Cardiology

## 2017-11-03 VITALS — BP 130/80 | HR 71 | Ht 66.0 in | Wt 204.8 lb

## 2017-11-03 DIAGNOSIS — E782 Mixed hyperlipidemia: Secondary | ICD-10-CM | POA: Diagnosis not present

## 2017-11-03 DIAGNOSIS — I10 Essential (primary) hypertension: Secondary | ICD-10-CM | POA: Diagnosis not present

## 2017-11-03 DIAGNOSIS — I2510 Atherosclerotic heart disease of native coronary artery without angina pectoris: Secondary | ICD-10-CM

## 2017-11-03 DIAGNOSIS — I2583 Coronary atherosclerosis due to lipid rich plaque: Secondary | ICD-10-CM

## 2017-11-03 NOTE — Progress Notes (Signed)
[facility]. [address], [address] [place], [place]  [address] Phone: [phone] Fax:  [phone]  Date:  11/03/2017   ID:  [name], [name] [birth year], MRN [id number]  PCP:  Orpah Melter, MD   History of Present Illness: [name] is a 71 y.o. female  with coronary artery disease status post bypass surgery 01/2011 X 4, LIMA to LAD patent, but other vein grafts occluded, status post circumflex stent in March of 2013 DES.   Prior symptoms included walking with dog and had chest pain across chest wall.   Her husband, previously saw Dr. Meda Coffee and had workup for heart failure. He has participated in cardiac rehabilitation.   She is doing well. Sometimes short of breath when chasing her dog around the house. Taking care of her grandchild.  11/03/17-overall doing very well, no complaints, no chest pain shortness of breath syncope bleeding orthopnea PND. She has lost some weight. Enjoyed her trip to [place].  Wt Readings from Last 3 Encounters:  11/03/17 204 lb 12.8 oz (92.9 kg)  05/07/17 210 lb 12.8 oz (95.6 kg)  07/22/16 202 lb 12.8 oz (92 kg)     Past Medical History:  Diagnosis Date  . Allergic rhinitis   . CAD (coronary artery disease)    , s/p CAGB 01/2011- LIMA to LAD patent, all other vein grafts occluded. Circumflex DES 3/13 placed following lateral ischemia on nuclear stress test  . Cervical spondylosis    , bulging  discs  . Disc herniation    L4-L5, L5-S1  . Hypertension   . Mixed hyperlipidemia   . Obesity   . Postmenopausal   . Prediabetes     Past Surgical History:  Procedure Laterality Date  . Bone Spur Left    Foot  . BREAST BIOPSY    . BREAST ENHANCEMENT SURGERY    . BREAST IMPLANT EXCHANGE     replaced rupture implant  . COLONOSCOPY    . CORONARY ARTERY BYPASS GRAFT    . Left Bunionectomy    . LEFT HEART CATHETERIZATION WITH CORONARY/GRAFT ANGIOGRAM  02/05/2012   Procedure: LEFT HEART CATHETERIZATION WITH Beatrix Fetters;  Surgeon: Candee Furbish, MD;  Location: [facility] CATH LAB;  Service: Cardiovascular;;  . PARTIAL HYSTERECTOMY    . REPLACEMENT TOTAL KNEE Right   . TONSILLECTOMY    . TUBAL LIGATION Bilateral  Current Outpatient Medications  Medication Sig Dispense Refill  . aspirin 81 MG tablet Take 81 mg by mouth daily.    Marland Kitchen atenolol (TENORMIN) 50 MG tablet Take 1 tablet (50 mg total) by mouth daily. 90 tablet 3  . CALCIUM PO Take 1,200 mg by mouth daily.     Marland Kitchen CLINDAMYCIN HCL PO Take by mouth as directed. For dental    . furosemide (LASIX) 20 MG tablet TAKE 1 TABLET BY MOUTH DAILY AS NEEDED 30 tablet 11  . ibuprofen (ADVIL,MOTRIN) 200 MG tablet Take 400 mg by mouth every 6 (six) hours as needed (pain).    . meloxicam (MOBIC) 15 MG tablet Take 15 mg by mouth daily as needed.  0  . Potassium Gluconate 550 (90 K) MG TABS Take 550 mg by mouth 2 (two) times daily before a meal.    . PRESCRIPTION MEDICATION as directed. Pain medication prescribed by orthopedic doctor - unsure of med name / strength    . rosuvastatin (CRESTOR) 20 MG tablet TAKE 1 TABLET BY MOUTH EVERY DAY 90 tablet 2   No current facility-administered medications for this visit.     Allergies:    Allergies  Allergen Reactions  . Norvasc [Amlodipine Besylate] Other (See Comments)    Edema   . Latex Itching and Rash  . Lipitor [Atorvastatin] Other (See Comments)    Muscle aches  . Lisinopril Swelling and Cough  . Penicillins Rash  . Sulfa Antibiotics Rash  . Tape Itching and Rash  . Tetanus Toxoids Other (See Comments)    Patient said "Didn't do well".    Social History:  The patient  reports that  has never smoked. she has never used smokeless tobacco. She reports that she drinks  alcohol. She reports that she does not use drugs.   ROS:  Please see the history of present illness.   Denies any syncope, bleeding, orthopnea, PND. She did have a recent fall due to clumsiness while playing with her grandchildren she states.    PHYSICAL EXAM: VS:  BP 130/80   Pulse 71   Ht 5\' 6"  (1.676 m)   Wt 204 lb 12.8 oz (92.9 kg)   SpO2 97%   BMI 33.06 kg/m  GEN: Well nourished, well developed, in no acute distress  HEENT: normal  Neck: no JVD, carotid bruits, or masses Cardiac: RRR; no murmurs, rubs, or gallops,no edema  Respiratory:  clear to auscultation bilaterally, normal work of breathing GI: soft, nontender, nondistended, + BS MS: no deformity or atrophy  Skin: warm and dry, no rash Neuro:  Alert and Oriented x 3, Strength and sensation are intact Psych: euthymic mood, full affect   EKG:   11/03/17-sinus rhythm first degree AV block PVCs, bigeminy pattern occasionally no other abnormality is personally viewed 01/17/15-sinus bradycardia rate 59, possible left atrial enlargement, no significant change from prior-Sinus rhythm, 64, no changes   Echocardiogram 12/19/15: Normal ejection fraction, grade 2 diastolic dysfunction  Labs: prior reviewed  ASSESSMENT AND PLAN:  1. Coronary artery disease-status post bypass. No anginal symptoms. Doing very well. Doing well with atenolol.  2. Hypertension-very well controlled, stable. No changes made. 3. Hyperlipidemia-had some issues in the past with atorvastatin, simvastatin. She is tolerating the Crestor well. Continue. LDL 85. 4. We will see her back in 12 months.   Signed, Candee Furbish, MD [facility]  11/03/2017 2:54 PM

## 2017-11-03 NOTE — Patient Instructions (Signed)

## 2017-11-11 NOTE — Addendum Note (Signed)
Addended by: Stephannie Peters on: 11/11/2017 09:30 AM   Modules accepted: Orders

## 2018-02-10 DIAGNOSIS — J00 Acute nasopharyngitis [common cold]: Secondary | ICD-10-CM | POA: Diagnosis not present

## 2018-02-17 DIAGNOSIS — J0100 Acute maxillary sinusitis, unspecified: Secondary | ICD-10-CM | POA: Diagnosis not present

## 2018-02-17 DIAGNOSIS — J069 Acute upper respiratory infection, unspecified: Secondary | ICD-10-CM | POA: Diagnosis not present

## 2018-03-14 ENCOUNTER — Other Ambulatory Visit: Payer: Self-pay | Admitting: Cardiology

## 2018-03-14 MED ORDER — ATENOLOL 50 MG PO TABS: 50.0000 mg | ORAL_TABLET | Freq: Every day | ORAL | 0 refills | Status: DC

## 2018-05-17 ENCOUNTER — Other Ambulatory Visit: Payer: Self-pay | Admitting: Cardiology

## 2018-05-18 ENCOUNTER — Other Ambulatory Visit: Payer: Self-pay | Admitting: *Deleted

## 2018-05-18 MED ORDER — FUROSEMIDE 20 MG PO TABS: 20.0000 mg | ORAL_TABLET | Freq: Every day | ORAL | 3 refills | Status: DC | PRN

## 2018-05-21 DIAGNOSIS — M25561 Pain in right knee: Secondary | ICD-10-CM | POA: Diagnosis not present

## 2018-05-21 DIAGNOSIS — M7061 Trochanteric bursitis, right hip: Secondary | ICD-10-CM | POA: Diagnosis not present

## 2018-05-21 DIAGNOSIS — Z09 Encounter for follow-up examination after completed treatment for conditions other than malignant neoplasm: Secondary | ICD-10-CM | POA: Diagnosis not present

## 2018-05-21 DIAGNOSIS — Z96651 Presence of right artificial knee joint: Secondary | ICD-10-CM | POA: Diagnosis not present

## 2018-05-21 DIAGNOSIS — M1611 Unilateral primary osteoarthritis, right hip: Secondary | ICD-10-CM | POA: Diagnosis not present

## 2018-06-13 DIAGNOSIS — M25552 Pain in left hip: Secondary | ICD-10-CM | POA: Diagnosis not present

## 2018-06-13 DIAGNOSIS — M25551 Pain in right hip: Secondary | ICD-10-CM | POA: Diagnosis not present

## 2018-06-16 DIAGNOSIS — M25551 Pain in right hip: Secondary | ICD-10-CM | POA: Diagnosis not present

## 2018-06-16 DIAGNOSIS — M25552 Pain in left hip: Secondary | ICD-10-CM | POA: Diagnosis not present

## 2018-06-20 DIAGNOSIS — M25551 Pain in right hip: Secondary | ICD-10-CM | POA: Diagnosis not present

## 2018-06-20 DIAGNOSIS — M25552 Pain in left hip: Secondary | ICD-10-CM | POA: Diagnosis not present

## 2018-06-23 DIAGNOSIS — M25551 Pain in right hip: Secondary | ICD-10-CM | POA: Diagnosis not present

## 2018-06-23 DIAGNOSIS — M25552 Pain in left hip: Secondary | ICD-10-CM | POA: Diagnosis not present

## 2018-06-27 DIAGNOSIS — M25552 Pain in left hip: Secondary | ICD-10-CM | POA: Diagnosis not present

## 2018-06-27 DIAGNOSIS — M25551 Pain in right hip: Secondary | ICD-10-CM | POA: Diagnosis not present

## 2018-06-30 DIAGNOSIS — M25551 Pain in right hip: Secondary | ICD-10-CM | POA: Diagnosis not present

## 2018-06-30 DIAGNOSIS — M25552 Pain in left hip: Secondary | ICD-10-CM | POA: Diagnosis not present

## 2018-07-04 DIAGNOSIS — M25551 Pain in right hip: Secondary | ICD-10-CM | POA: Diagnosis not present

## 2018-07-04 DIAGNOSIS — M25552 Pain in left hip: Secondary | ICD-10-CM | POA: Diagnosis not present

## 2018-07-07 DIAGNOSIS — M25552 Pain in left hip: Secondary | ICD-10-CM | POA: Diagnosis not present

## 2018-07-07 DIAGNOSIS — M25551 Pain in right hip: Secondary | ICD-10-CM | POA: Diagnosis not present

## 2018-07-11 DIAGNOSIS — M25551 Pain in right hip: Secondary | ICD-10-CM | POA: Diagnosis not present

## 2018-07-11 DIAGNOSIS — M25552 Pain in left hip: Secondary | ICD-10-CM | POA: Diagnosis not present

## 2018-07-13 DIAGNOSIS — M25551 Pain in right hip: Secondary | ICD-10-CM | POA: Diagnosis not present

## 2018-07-13 DIAGNOSIS — M25552 Pain in left hip: Secondary | ICD-10-CM | POA: Diagnosis not present

## 2018-07-18 DIAGNOSIS — M25551 Pain in right hip: Secondary | ICD-10-CM | POA: Diagnosis not present

## 2018-07-18 DIAGNOSIS — M25552 Pain in left hip: Secondary | ICD-10-CM | POA: Diagnosis not present

## 2018-07-20 DIAGNOSIS — M25551 Pain in right hip: Secondary | ICD-10-CM | POA: Diagnosis not present

## 2018-07-20 DIAGNOSIS — M25552 Pain in left hip: Secondary | ICD-10-CM | POA: Diagnosis not present

## 2018-08-01 DIAGNOSIS — M25552 Pain in left hip: Secondary | ICD-10-CM | POA: Diagnosis not present

## 2018-08-01 DIAGNOSIS — M25551 Pain in right hip: Secondary | ICD-10-CM | POA: Diagnosis not present

## 2018-08-03 DIAGNOSIS — M25551 Pain in right hip: Secondary | ICD-10-CM | POA: Diagnosis not present

## 2018-08-03 DIAGNOSIS — M25552 Pain in left hip: Secondary | ICD-10-CM | POA: Diagnosis not present

## 2018-08-08 DIAGNOSIS — R69 Illness, unspecified: Secondary | ICD-10-CM | POA: Diagnosis not present

## 2018-08-09 DIAGNOSIS — M25552 Pain in left hip: Secondary | ICD-10-CM | POA: Diagnosis not present

## 2018-08-09 DIAGNOSIS — M25551 Pain in right hip: Secondary | ICD-10-CM | POA: Diagnosis not present

## 2018-08-11 DIAGNOSIS — M25552 Pain in left hip: Secondary | ICD-10-CM | POA: Diagnosis not present

## 2018-08-11 DIAGNOSIS — M25551 Pain in right hip: Secondary | ICD-10-CM | POA: Diagnosis not present

## 2018-08-23 DIAGNOSIS — E782 Mixed hyperlipidemia: Secondary | ICD-10-CM | POA: Diagnosis not present

## 2018-08-23 DIAGNOSIS — E669 Obesity, unspecified: Secondary | ICD-10-CM | POA: Diagnosis not present

## 2018-08-23 DIAGNOSIS — I2510 Atherosclerotic heart disease of native coronary artery without angina pectoris: Secondary | ICD-10-CM | POA: Diagnosis not present

## 2018-08-23 DIAGNOSIS — Z23 Encounter for immunization: Secondary | ICD-10-CM | POA: Diagnosis not present

## 2018-08-23 DIAGNOSIS — Z0000 Encounter for general adult medical examination without abnormal findings: Secondary | ICD-10-CM | POA: Diagnosis not present

## 2018-08-23 DIAGNOSIS — Z1211 Encounter for screening for malignant neoplasm of colon: Secondary | ICD-10-CM | POA: Diagnosis not present

## 2018-08-23 DIAGNOSIS — I10 Essential (primary) hypertension: Secondary | ICD-10-CM | POA: Diagnosis not present

## 2018-08-23 DIAGNOSIS — Z6830 Body mass index (BMI) 30.0-30.9, adult: Secondary | ICD-10-CM | POA: Diagnosis not present

## 2018-08-23 DIAGNOSIS — Z1239 Encounter for other screening for malignant neoplasm of breast: Secondary | ICD-10-CM | POA: Diagnosis not present

## 2018-09-02 DIAGNOSIS — H25013 Cortical age-related cataract, bilateral: Secondary | ICD-10-CM | POA: Diagnosis not present

## 2018-09-02 DIAGNOSIS — H40013 Open angle with borderline findings, low risk, bilateral: Secondary | ICD-10-CM | POA: Diagnosis not present

## 2018-09-02 DIAGNOSIS — H2513 Age-related nuclear cataract, bilateral: Secondary | ICD-10-CM | POA: Diagnosis not present

## 2018-09-02 DIAGNOSIS — H35033 Hypertensive retinopathy, bilateral: Secondary | ICD-10-CM | POA: Diagnosis not present

## 2018-09-23 DIAGNOSIS — Z1231 Encounter for screening mammogram for malignant neoplasm of breast: Secondary | ICD-10-CM | POA: Diagnosis not present

## 2018-10-20 DIAGNOSIS — R928 Other abnormal and inconclusive findings on diagnostic imaging of breast: Secondary | ICD-10-CM | POA: Diagnosis not present

## 2018-10-20 DIAGNOSIS — N6489 Other specified disorders of breast: Secondary | ICD-10-CM | POA: Diagnosis not present

## 2018-10-28 ENCOUNTER — Other Ambulatory Visit: Payer: Self-pay | Admitting: Cardiology

## 2018-11-01 ENCOUNTER — Other Ambulatory Visit: Payer: Self-pay | Admitting: Family Medicine

## 2018-11-01 DIAGNOSIS — N6489 Other specified disorders of breast: Secondary | ICD-10-CM

## 2018-11-07 ENCOUNTER — Ambulatory Visit: Payer: Medicare HMO | Admitting: Cardiology

## 2018-11-08 ENCOUNTER — Ambulatory Visit: Payer: Medicare HMO | Admitting: Cardiology

## 2018-11-08 ENCOUNTER — Encounter: Payer: Self-pay | Admitting: Cardiology

## 2018-11-08 VITALS — BP 137/85 | HR 65 | Ht 66.0 in | Wt 199.4 lb

## 2018-11-08 DIAGNOSIS — I10 Essential (primary) hypertension: Secondary | ICD-10-CM

## 2018-11-08 DIAGNOSIS — I2583 Coronary atherosclerosis due to lipid rich plaque: Secondary | ICD-10-CM | POA: Diagnosis not present

## 2018-11-08 DIAGNOSIS — E782 Mixed hyperlipidemia: Secondary | ICD-10-CM

## 2018-11-08 DIAGNOSIS — I2510 Atherosclerotic heart disease of native coronary artery without angina pectoris: Secondary | ICD-10-CM | POA: Diagnosis not present

## 2018-11-08 NOTE — Progress Notes (Signed)
Cardiology Office Note:    Date:  11/08/2018   ID:  [name], [name] June 02, 1946, MRN [id number]  PCP:  Orpah Melter, MD  Cardiologist:  Candee Furbish, MD  Electrophysiologist:  None   Referring MD: Orpah Melter, MD     History of Present Illness:    [name] is a 72 y.o. female here for follow-up of coronary artery disease, CABG.  bypass surgery 01/2011 X 4, LIMA to LAD patent, but other vein grafts occluded, status post circumflex stent in March of 2013 DES.   New SOB with Stairs Dizzy, ?meds. Staggers. Dys. Hip bursitis.   Past Medical History:  Diagnosis Date  . Allergic rhinitis   . CAD (coronary artery disease)    , s/p CAGB 01/2011- LIMA to LAD patent, all other vein grafts occluded. Circumflex DES 3/13 placed following lateral ischemia on nuclear stress test  . Cervical spondylosis    , bulging  discs  . Disc herniation    L4-L5, L5-S1  . Hypertension   . Mixed hyperlipidemia   . Obesity   . Postmenopausal   . Prediabetes     Past Surgical History:  Procedure Laterality Date  . Bone Spur Left    Foot  . BREAST BIOPSY    . BREAST ENHANCEMENT SURGERY    . BREAST IMPLANT EXCHANGE     replaced rupture implant  . COLONOSCOPY    . CORONARY ARTERY BYPASS GRAFT    . Left Bunionectomy    . LEFT HEART CATHETERIZATION WITH CORONARY/GRAFT ANGIOGRAM  02/05/2012   Procedure: LEFT HEART CATHETERIZATION WITH Beatrix Fetters;  Surgeon: Candee Furbish, MD;  Location: [facility] CATH LAB;  Service: Cardiovascular;;  . PARTIAL HYSTERECTOMY    . REPLACEMENT TOTAL KNEE Right   . TONSILLECTOMY    . TUBAL LIGATION Bilateral     Current Medications: Current Meds  Medication Sig  . aspirin 81 MG tablet Take 81 mg by mouth daily.  Marland Kitchen atenolol (TENORMIN) 50 MG tablet TAKE 1 TABLET BY MOUTH EVERY DAY  . CALCIUM PO Take 1,200 mg by mouth daily.   . furosemide (LASIX) 20 MG tablet TAKE 1 TABLET BY MOUTH EVERY DAY AS NEEDED  . ibuprofen (ADVIL,MOTRIN) 200 MG tablet  Take 400 mg by mouth every 6 (six) hours as needed (pain).  . Potassium Gluconate 550 (90 K) MG TABS Take 550 mg by mouth 2 (two) times daily before a meal.  . PRESCRIPTION MEDICATION as directed. Pain medication prescribed by orthopedic doctor - unsure of med name / strength  . rosuvastatin (CRESTOR) 20 MG tablet TAKE 1 TABLET BY MOUTH EVERY DAY     Allergies:   Norvasc [amlodipine besylate]; Latex; Lipitor [atorvastatin]; Lisinopril; Penicillins; Sulfa antibiotics; Tape; and Tetanus toxoids   Social History   Socioeconomic History  . Marital status: Married    Spouse name: Not on file  . Number of children: Not on file  . Years of education: Not on file  . Highest education level: Not on file  Occupational History  . Not on file  Social Needs  . Financial resource strain: Not on file  . Food insecurity:    Worry: Not on file    Inability: Not on file  . Transportation needs:    Medical: Not on file    Non-medical: Not on file  Tobacco Use  . Smoking status: Never Smoker  . Smokeless tobacco: Never Used  Substance and Sexual Activity  . Alcohol use: Yes    Comment: Daily  .  Drug use: No  . Sexual activity: Not on file  Lifestyle  . Physical activity:    Days per week: Not on file    Minutes per session: Not on file  . Stress: Not on file  Relationships  . Social connections:    Talks on phone: Not on file    Gets together: Not on file    Attends religious service: Not on file    Active member of club or organization: Not on file    Attends meetings of clubs or organizations: Not on file    Relationship status: Not on file  Other Topics Concern  . Not on file  Social History Narrative  . Not on file     Family History: The patient's family history includes Heart attack in her father; Heart disease in her brother, brother, and mother; Hypertension in her mother.  ROS:   Please see the history of present illness.    No fevers chills nausea vomiting all other  systems reviewed and are negative.  EKGs/Labs/Other Studies Reviewed:    The following studies were reviewed today: Prior EKG lab work office notes  EKG:  EKG is  ordered today.  The ekg ordered today demonstrates sinus rhythm first-degree AV block 66 possible left atrial enlargement personally reviewed and interpreted.  Recent Labs: No results found for requested labs within last 8760 hours.  Recent Lipid Panel    Component Value Date/Time   CHOL 147 06/11/2014 1029   TRIG 146.0 06/11/2014 1029   HDL 41.40 06/11/2014 1029   CHOLHDL 4 06/11/2014 1029   VLDL 29.2 06/11/2014 1029   LDLCALC 76 06/11/2014 1029    Physical Exam:    VS:  BP 137/85 (BP Location: Left Arm, Cuff Size: Large)   Pulse 65   Ht 5\' 6"  (1.676 m)   Wt 199 lb 6.4 oz (90.4 kg)   BMI 32.18 kg/m     Wt Readings from Last 3 Encounters:  11/08/18 199 lb 6.4 oz (90.4 kg)  11/03/17 204 lb 12.8 oz (92.9 kg)  05/07/17 210 lb 12.8 oz (95.6 kg)     GEN:  Well nourished, well developed in no acute distress HEENT: Normal NECK: No JVD; No carotid bruits LYMPHATICS: No lymphadenopathy CARDIAC: RRR, no murmurs, rubs, gallops RESPIRATORY:  Clear to auscultation without rales, wheezing or rhonchi  ABDOMEN: Soft, non-tender, non-distended MUSCULOSKELETAL:  No edema; No deformity  SKIN: Warm and dry NEUROLOGIC:  Alert and oriented x 3 PSYCHIATRIC:  Normal affect   ASSESSMENT:    1. Coronary artery disease due to lipid rich plaque   2. Mixed hyperlipidemia   3. Essential hypertension, benign    PLAN:    In order of problems listed above:  Coronary artery disease status post CABG -Doing very well no anginal symptoms continue with beta-blocker, aspirin.  Taking atenolol.  PR interval 212 ms.  Hyperlipidemia -Continue with Crestor LDL recently controlled.  Essential hypertension - Stable no changes made.  Dizziness/disequilibrium - Orthostatics- normal orthostatics. - She had hip bursitis.  She would  like to try to exercise a little bit more to see if this helps her with her balance.  For instance if she is standing in the hallway and turns her head quickly to the left or the right she does feel unsteady.  Sometimes staggers.  She does not feel this way when laying down in the bed.  She has to be careful at night when walking to her bedroom.  Has to hold  onto the wall for a few seconds.  Medication Adjustments/Labs and Tests Ordered: Current medicines are reviewed at length with the patient today.  Concerns regarding medicines are outlined above.  Orders Placed This Encounter  Procedures  . EKG 12-Lead   No orders of the defined types were placed in this encounter.   Patient Instructions  Medication Instructions:  The current medical regimen is effective;  continue present plan and medications.  If you need a refill on your cardiac medications before your next appointment, please call your pharmacy.   Follow-Up: At [facility], you and your health needs are our priority.  As part of our continuing mission to provide you with exceptional heart care, we have created designated Provider Care Teams.  These Care Teams include your primary Cardiologist (physician) and Advanced Practice Providers (APPs -  Physician Assistants and Nurse Practitioners) who all work together to provide you with the care you need, when you need it. You will need a follow up appointment in 12 months.  Please call our office 2 months in advance to schedule this appointment.  You may see Candee Furbish, MD or one of the following Advanced Practice Providers on your designated Care Team:   Truitt Merle, NP Cecilie Kicks, NP . Kathyrn Drown, NP  Thank you for choosing [facility]!!         Signed, Candee Furbish, MD  11/08/2018 3:57 PM    [place]

## 2018-11-08 NOTE — Patient Instructions (Signed)
Medication Instructions:  The current medical regimen is effective;  continue present plan and medications.  If you need a refill on your cardiac medications before your next appointment, please call your pharmacy.   Follow-Up: At [facility], you and your health needs are our priority.  As part of our continuing mission to provide you with exceptional heart care, we have created designated Provider Care Teams.  These Care Teams include your primary Cardiologist (physician) and Advanced Practice Providers (APPs -  Physician Assistants and Nurse Practitioners) who all work together to provide you with the care you need, when you need it. You will need a follow up appointment in 12 months.  Please call our office 2 months in advance to schedule this appointment.  You may see Mark Skains, MD or one of the following Advanced Practice Providers on your designated Care Team:   Lori Gerhardt, NP Laura Ingold, NP . Jill McDaniel, NP  Thank you for choosing  [facility]!!      

## 2018-11-12 ENCOUNTER — Other Ambulatory Visit: Payer: Self-pay | Admitting: Cardiology

## 2018-11-16 ENCOUNTER — Other Ambulatory Visit: Payer: Self-pay | Admitting: Cardiology

## 2018-11-20 ENCOUNTER — Other Ambulatory Visit: Payer: Self-pay | Admitting: Cardiology

## 2019-04-08 ENCOUNTER — Other Ambulatory Visit: Payer: Self-pay | Admitting: Cardiology

## 2019-04-28 ENCOUNTER — Ambulatory Visit
Admission: RE | Admit: 2019-04-28 | Discharge: 2019-04-28 | Disposition: A | Discharge status: Home / Self Care | Payer: Medicare HMO | Source: Ambulatory Visit | Attending: Family Medicine | Admitting: Family Medicine

## 2019-04-28 ENCOUNTER — Other Ambulatory Visit: Payer: Self-pay

## 2019-04-28 ENCOUNTER — Other Ambulatory Visit: Payer: Self-pay | Admitting: Family Medicine

## 2019-04-28 DIAGNOSIS — N6489 Other specified disorders of breast: Secondary | ICD-10-CM

## 2019-07-06 DIAGNOSIS — H9201 Otalgia, right ear: Secondary | ICD-10-CM | POA: Diagnosis not present

## 2019-07-06 DIAGNOSIS — H6121 Impacted cerumen, right ear: Secondary | ICD-10-CM | POA: Diagnosis not present

## 2019-07-06 DIAGNOSIS — R300 Dysuria: Secondary | ICD-10-CM | POA: Diagnosis not present

## 2019-08-10 DIAGNOSIS — R69 Illness, unspecified: Secondary | ICD-10-CM | POA: Diagnosis not present

## 2019-09-07 DIAGNOSIS — H35013 Changes in retinal vascular appearance, bilateral: Secondary | ICD-10-CM | POA: Diagnosis not present

## 2019-09-07 DIAGNOSIS — H524 Presbyopia: Secondary | ICD-10-CM | POA: Diagnosis not present

## 2019-09-07 DIAGNOSIS — H35033 Hypertensive retinopathy, bilateral: Secondary | ICD-10-CM | POA: Diagnosis not present

## 2019-09-07 DIAGNOSIS — H35363 Drusen (degenerative) of macula, bilateral: Secondary | ICD-10-CM | POA: Diagnosis not present

## 2019-09-07 DIAGNOSIS — H40013 Open angle with borderline findings, low risk, bilateral: Secondary | ICD-10-CM | POA: Diagnosis not present

## 2019-10-04 DIAGNOSIS — Z0100 Encounter for examination of eyes and vision without abnormal findings: Secondary | ICD-10-CM | POA: Diagnosis not present

## 2019-10-09 ENCOUNTER — Other Ambulatory Visit: Payer: Self-pay

## 2019-10-09 ENCOUNTER — Ambulatory Visit
Admission: RE | Admit: 2019-10-09 | Discharge: 2019-10-09 | Disposition: A | Discharge status: Home / Self Care | Payer: Medicare HMO | Source: Ambulatory Visit | Attending: Family Medicine | Admitting: Family Medicine

## 2019-10-09 DIAGNOSIS — N6322 Unspecified lump in the left breast, upper inner quadrant: Secondary | ICD-10-CM | POA: Diagnosis not present

## 2019-10-09 DIAGNOSIS — N6324 Unspecified lump in the left breast, lower inner quadrant: Secondary | ICD-10-CM | POA: Diagnosis not present

## 2019-10-09 DIAGNOSIS — N6489 Other specified disorders of breast: Secondary | ICD-10-CM

## 2019-10-09 DIAGNOSIS — R928 Other abnormal and inconclusive findings on diagnostic imaging of breast: Secondary | ICD-10-CM | POA: Diagnosis not present

## 2019-11-09 ENCOUNTER — Ambulatory Visit: Payer: Medicare HMO | Admitting: Cardiology

## 2019-11-09 ENCOUNTER — Other Ambulatory Visit: Payer: Self-pay

## 2019-11-09 ENCOUNTER — Encounter: Payer: Self-pay | Admitting: Cardiology

## 2019-11-09 VITALS — BP 132/78 | HR 69 | Ht 66.0 in | Wt 191.0 lb

## 2019-11-09 DIAGNOSIS — I10 Essential (primary) hypertension: Secondary | ICD-10-CM | POA: Diagnosis not present

## 2019-11-09 DIAGNOSIS — I2583 Coronary atherosclerosis due to lipid rich plaque: Secondary | ICD-10-CM

## 2019-11-09 DIAGNOSIS — I2510 Atherosclerotic heart disease of native coronary artery without angina pectoris: Secondary | ICD-10-CM | POA: Diagnosis not present

## 2019-11-09 DIAGNOSIS — E782 Mixed hyperlipidemia: Secondary | ICD-10-CM

## 2019-11-09 NOTE — Progress Notes (Signed)
Cardiology Office Note:    Date:  11/09/2019   ID:  [name], DOB 1946-02-18, MRN [id number]  PCP:  Orpah Melter, MD  Cardiologist:  Candee Furbish, MD  Electrophysiologist:  None   Referring MD: Orpah Melter, MD     History of Present Illness:    [name] is a 73 y.o. female here for follow-up of coronary artery disease, CABG.  bypass surgery 01/2011 X 4, LIMA to LAD patent, but other vein grafts occluded, status post circumflex stent in March of 2013 DES.   No SOB now with stairs.   Hip bursitis. Flushed out right rat and better. And could hear.   Compliant with her medications, no problems with myalgias.  No bleeding.  Denies any fevers chills nausea vomiting syncope.  Past Medical History:  Diagnosis Date  . Allergic rhinitis   . CAD (coronary artery disease)    , s/p CAGB 01/2011- LIMA to LAD patent, all other vein grafts occluded. Circumflex DES 3/13 placed following lateral ischemia on nuclear stress test  . Cervical spondylosis    , bulging  discs  . Disc herniation    L4-L5, L5-S1  . Hypertension   . Mixed hyperlipidemia   . Obesity   . Postmenopausal   . Prediabetes     Past Surgical History:  Procedure Laterality Date  . Bone Spur Left    Foot  . BREAST BIOPSY    . BREAST ENHANCEMENT SURGERY    . BREAST IMPLANT EXCHANGE     replaced rupture implant  . COLONOSCOPY    . CORONARY ARTERY BYPASS GRAFT    . Left Bunionectomy    . LEFT HEART CATHETERIZATION WITH CORONARY/GRAFT ANGIOGRAM  02/05/2012   Procedure: LEFT HEART CATHETERIZATION WITH Beatrix Fetters;  Surgeon: Candee Furbish, MD;  Location: [facility] CATH LAB;  Service: Cardiovascular;;  . PARTIAL HYSTERECTOMY    . REPLACEMENT TOTAL KNEE Right   . TONSILLECTOMY    . TUBAL LIGATION Bilateral     Current Medications: Current Meds  Medication Sig  . aspirin 81 MG tablet Take 81 mg by mouth daily.  Marland Kitchen atenolol (TENORMIN) 50 MG tablet TAKE 1 TABLET BY MOUTH EVERY DAY  .  CALCIUM PO Take 1,200 mg by mouth daily.   . furosemide (LASIX) 20 MG tablet TAKE 1 TABLET BY MOUTH EVERY DAY AS NEEDED  . Potassium Gluconate 550 (90 K) MG TABS Take 550 mg by mouth 2 (two) times daily before a meal.  . PRESCRIPTION MEDICATION as directed. Pain medication prescribed by orthopedic doctor - unsure of med name / strength  . rosuvastatin (CRESTOR) 20 MG tablet TAKE 1 TABLET BY MOUTH EVERY DAY     Allergies:   Norvasc [amlodipine besylate], Latex, Lipitor [atorvastatin], Lisinopril, Penicillins, Sulfa antibiotics, Tape, and Tetanus toxoids   Social History   Socioeconomic History  . Marital status: Married    Spouse name: Not on file  . Number of children: Not on file  . Years of education: Not on file  . Highest education level: Not on file  Occupational History  . Not on file  Social Needs  . Financial resource strain: Not on file  . Food insecurity    Worry: Not on file    Inability: Not on file  . Transportation needs    Medical: Not on file    Non-medical: Not on file  Tobacco Use  . Smoking status: Never Smoker  . Smokeless tobacco: Never Used  Substance and Sexual Activity  .  Alcohol use: Yes    Comment: Daily  . Drug use: No  . Sexual activity: Not on file  Lifestyle  . Physical activity    Days per week: Not on file    Minutes per session: Not on file  . Stress: Not on file  Relationships  . Social Herbalist on phone: Not on file    Gets together: Not on file    Attends religious service: Not on file    Active member of club or organization: Not on file    Attends meetings of clubs or organizations: Not on file    Relationship status: Not on file  Other Topics Concern  . Not on file  Social History Narrative  . Not on file     Family History: The patient's family history includes Heart attack in her father; Heart disease in her brother, brother, and mother; Hypertension in her mother.  ROS:   Please see the history of present  illness.    No fevers chills nausea vomiting all other systems reviewed and are negative.  EKGs/Labs/Other Studies Reviewed:    The following studies were reviewed today: Prior EKG lab work office notes  EKG:  EKG is  ordered today.  The ekg ordered today demonstrates 11/09/2019-sinus rhythm with PAC first-degree AV block 214 ms, very mild.  QT C is 497 prior 475 sinus rhythm first-degree AV block 66 possible left atrial enlargement personally reviewed and interpreted.  Recent Labs: No results found for requested labs within last 8760 hours.  Recent Lipid Panel    Component Value Date/Time   CHOL 147 06/11/2014 1029   TRIG 146.0 06/11/2014 1029   HDL 41.40 06/11/2014 1029   CHOLHDL 4 06/11/2014 1029   VLDL 29.2 06/11/2014 1029   LDLCALC 76 06/11/2014 1029    Physical Exam:    VS:  BP 132/78   Pulse 69   Ht 5\' 6"  (1.676 m)   Wt 191 lb (86.6 kg)   SpO2 98%   BMI 30.83 kg/m     Wt Readings from Last 3 Encounters:  11/09/19 191 lb (86.6 kg)  11/08/18 199 lb 6.4 oz (90.4 kg)  11/03/17 204 lb 12.8 oz (92.9 kg)     GEN: Well nourished, well developed, in no acute distress  HEENT: normal  Neck: no JVD, carotid bruits, or masses Cardiac: RRR; no murmurs, rubs, or gallops,no edema  Respiratory:  clear to auscultation bilaterally, normal work of breathing GI: soft, nontender, nondistended, + BS MS: no deformity or atrophy  Skin: warm and dry, no rash Neuro:  Alert and Oriented x 3, Strength and sensation are intact Psych: euthymic mood, full affect   ASSESSMENT:    1. Coronary artery disease due to lipid rich plaque   2. Essential hypertension, benign   3. Mixed hyperlipidemia    PLAN:    In order of problems listed above:  Coronary artery disease status post CABG -Doing very well no anginal symptoms continue with beta-blocker, aspirin.  Taking atenolol.  PR interval 212 ms.  Hyperlipidemia -Continue with Crestor LDL LDL 67.  She would like to get her blood work  done at Dr. Olen Pel office since the use a special pediatric needle and take it from her hand.  Essential hypertension -No changes made.  Great job with weight loss.  Dizziness/disequilibrium -She states after getting her right ear cleaned, her symptoms went away.  Excellent. - normal orthostatics.  Hip pain -PT sessions x2.  Hard to  walk at length.  Medication Adjustments/Labs and Tests Ordered: Current medicines are reviewed at length with the patient today.  Concerns regarding medicines are outlined above.  Orders Placed This Encounter  Procedures  . EKG 12-Lead   No orders of the defined types were placed in this encounter.   Patient Instructions  Medication Instructions:  The current medical regimen is effective;  continue present plan and medications.  *If you need a refill on your cardiac medications before your next appointment, please call your pharmacy*  Follow-Up: At [facility], you and your health needs are our priority.  As part of our continuing mission to provide you with exceptional heart care, we have created designated Provider Care Teams.  These Care Teams include your primary Cardiologist (physician) and Advanced Practice Providers (APPs -  Physician Assistants and Nurse Practitioners) who all work together to provide you with the care you need, when you need it.  Your next appointment:   1 year(s)  The format for your next appointment:   In Person  Provider:   Candee Furbish, MD  Thank you for choosing [facility]!!         Signed, Candee Furbish, MD  11/09/2019 11:46 AM    [place]

## 2019-11-09 NOTE — Patient Instructions (Signed)
Medication Instructions:  The current medical regimen is effective;  continue present plan and medications.  *If you need a refill on your cardiac medications before your next appointment, please call your pharmacy*  Follow-Up: At [facility], you and your health needs are our priority.  As part of our continuing mission to provide you with exceptional heart care, we have created designated Provider Care Teams.  These Care Teams include your primary Cardiologist (physician) and Advanced Practice Providers (APPs -  Physician Assistants and Nurse Practitioners) who all work together to provide you with the care you need, when you need it.  Your next appointment:   1 year(s)  The format for your next appointment:   In Person  Provider:   Mark Skains, MD  Thank you for choosing [facility]!!      

## 2019-11-20 ENCOUNTER — Other Ambulatory Visit: Payer: Self-pay | Admitting: Cardiology

## 2019-12-18 DIAGNOSIS — J069 Acute upper respiratory infection, unspecified: Secondary | ICD-10-CM | POA: Diagnosis not present

## 2019-12-25 DIAGNOSIS — H9202 Otalgia, left ear: Secondary | ICD-10-CM | POA: Diagnosis not present

## 2020-06-30 DIAGNOSIS — M25561 Pain in right knee: Secondary | ICD-10-CM | POA: Diagnosis not present

## 2020-06-30 DIAGNOSIS — Z96651 Presence of right artificial knee joint: Secondary | ICD-10-CM | POA: Diagnosis not present

## 2020-06-30 DIAGNOSIS — W1839XA Other fall on same level, initial encounter: Secondary | ICD-10-CM | POA: Diagnosis not present

## 2020-06-30 DIAGNOSIS — S0101XA Laceration without foreign body of scalp, initial encounter: Secondary | ICD-10-CM | POA: Diagnosis not present

## 2020-07-10 DIAGNOSIS — Z4802 Encounter for removal of sutures: Secondary | ICD-10-CM | POA: Diagnosis not present

## 2020-07-10 DIAGNOSIS — S0181XA Laceration without foreign body of other part of head, initial encounter: Secondary | ICD-10-CM | POA: Diagnosis not present

## 2020-07-30 ENCOUNTER — Telehealth: Payer: Self-pay | Admitting: Cardiology

## 2020-07-30 NOTE — Telephone Encounter (Signed)
Attempted to contact pt at the phone number listed in this message.  Pt answered the phone but when I introduced myself there was no response.  Pt just kept saying "Hello, Hello" then the call was disconnected.  Attempted again and pt answered stating this is [name].  I announced myself and asked if she can hear me to which she responded "Yes" .  I advised that I am calling back from Dr Marlou Porch office to discuss her earlier call -  There was then silence on the call and call was dropped.  Will attempt to c/b.

## 2020-07-30 NOTE — Telephone Encounter (Signed)
STAT if patient feels like he/she is going to faint   1) Are you dizzy now? Slightly  2) Do you feel faint or have you passed out? no  3) Do you have any other symptoms? At times she said she gasp for a breathe   4) Have you checked your HR and BP (record if available)? 137/82 HR 60  She states when she lays down the room spins on her for awhile then it stops.  She also feels dizzy when she change positions, from laying to sitting to standing.

## 2020-07-31 NOTE — Telephone Encounter (Signed)
Was able to contact pt via phone to discuss her s/s.  She reports she has been having dizziness for over a year.  She had thought it was r/t ear wax however that has been cleared twice at her PCP and dizziness has not improved.  BP yesterday was 137/82 HR 60 at 11:57 am.  She had taken her medications earlier in the day.  She reports dizziness is worse with changing positions and that she has to wait before getting up to walk after laying of sitting.  She reports the dizziness does not last very long and it passes on its own.  In review of her medications she states she has been taking Furosemide everyday despite it being written for as needed.  She reports she has just been taking it that way.  Advised to decrease back to as needed, make sure she is staying well hydrated and that she can try knee high compression stocking daily while up during the day.  Pt expresses understanding and gratitude for the call back and information.  She will c/b if no improvement in her s/s with these changes.

## 2020-08-08 DIAGNOSIS — S060X9A Concussion with loss of consciousness of unspecified duration, initial encounter: Secondary | ICD-10-CM | POA: Diagnosis not present

## 2020-08-08 DIAGNOSIS — Z23 Encounter for immunization: Secondary | ICD-10-CM | POA: Diagnosis not present

## 2020-08-09 ENCOUNTER — Other Ambulatory Visit: Payer: Self-pay | Admitting: Family Medicine

## 2020-08-09 DIAGNOSIS — S060X9A Concussion with loss of consciousness of unspecified duration, initial encounter: Secondary | ICD-10-CM

## 2020-09-06 ENCOUNTER — Ambulatory Visit
Admission: RE | Admit: 2020-09-06 | Discharge: 2020-09-06 | Disposition: A | Discharge status: Home / Self Care | Payer: Medicare HMO | Source: Ambulatory Visit | Attending: Family Medicine | Admitting: Family Medicine

## 2020-09-06 ENCOUNTER — Other Ambulatory Visit: Payer: Self-pay

## 2020-09-06 DIAGNOSIS — I6782 Cerebral ischemia: Secondary | ICD-10-CM | POA: Diagnosis not present

## 2020-09-06 DIAGNOSIS — S060X9A Concussion with loss of consciousness of unspecified duration, initial encounter: Secondary | ICD-10-CM | POA: Diagnosis not present

## 2020-09-06 DIAGNOSIS — G319 Degenerative disease of nervous system, unspecified: Secondary | ICD-10-CM | POA: Diagnosis not present

## 2020-09-06 DIAGNOSIS — J0120 Acute ethmoidal sinusitis, unspecified: Secondary | ICD-10-CM | POA: Diagnosis not present

## 2020-09-06 MED ORDER — GADOBENATE DIMEGLUMINE 529 MG/ML IV SOLN: 15.0000 mL | Freq: Once | INTRAVENOUS | Status: DC | PRN

## 2020-09-06 MED ORDER — GADOBENATE DIMEGLUMINE 529 MG/ML IV SOLN
19.0000 mL | Freq: Once | INTRAVENOUS | Status: AC | PRN
  Administered 2020-09-06: 19 mL via INTRAVENOUS

## 2020-09-10 DIAGNOSIS — S060X9A Concussion with loss of consciousness of unspecified duration, initial encounter: Secondary | ICD-10-CM | POA: Diagnosis not present

## 2020-09-13 ENCOUNTER — Other Ambulatory Visit: Payer: Self-pay | Admitting: Family Medicine

## 2020-09-13 ENCOUNTER — Encounter: Payer: Self-pay | Admitting: Neurology

## 2020-09-13 DIAGNOSIS — N6489 Other specified disorders of breast: Secondary | ICD-10-CM

## 2020-09-30 ENCOUNTER — Other Ambulatory Visit: Payer: Self-pay | Admitting: Cardiology

## 2020-10-11 ENCOUNTER — Ambulatory Visit
Admission: RE | Admit: 2020-10-11 | Discharge: 2020-10-11 | Disposition: A | Discharge status: Home / Self Care | Payer: Medicare HMO | Source: Ambulatory Visit | Attending: Family Medicine | Admitting: Family Medicine

## 2020-10-11 ENCOUNTER — Ambulatory Visit: Payer: Medicare HMO

## 2020-10-11 ENCOUNTER — Other Ambulatory Visit: Payer: Self-pay

## 2020-10-11 ENCOUNTER — Other Ambulatory Visit: Payer: Self-pay | Admitting: Family Medicine

## 2020-10-11 DIAGNOSIS — R922 Inconclusive mammogram: Secondary | ICD-10-CM | POA: Diagnosis not present

## 2020-10-11 DIAGNOSIS — R921 Mammographic calcification found on diagnostic imaging of breast: Secondary | ICD-10-CM

## 2020-10-11 DIAGNOSIS — N6489 Other specified disorders of breast: Secondary | ICD-10-CM

## 2020-10-11 DIAGNOSIS — N6322 Unspecified lump in the left breast, upper inner quadrant: Secondary | ICD-10-CM | POA: Diagnosis not present

## 2020-10-11 DIAGNOSIS — N6324 Unspecified lump in the left breast, lower inner quadrant: Secondary | ICD-10-CM | POA: Diagnosis not present

## 2020-10-15 ENCOUNTER — Ambulatory Visit: Payer: Medicare HMO | Attending: Internal Medicine

## 2020-10-15 DIAGNOSIS — Z23 Encounter for immunization: Secondary | ICD-10-CM

## 2020-10-17 ENCOUNTER — Other Ambulatory Visit: Payer: Self-pay

## 2020-10-17 ENCOUNTER — Ambulatory Visit
Admission: RE | Admit: 2020-10-17 | Discharge: 2020-10-17 | Disposition: A | Discharge status: Home / Self Care | Payer: Medicare HMO | Source: Ambulatory Visit | Attending: Family Medicine | Admitting: Family Medicine

## 2020-10-17 ENCOUNTER — Other Ambulatory Visit: Payer: Self-pay | Admitting: Family Medicine

## 2020-10-17 DIAGNOSIS — R921 Mammographic calcification found on diagnostic imaging of breast: Secondary | ICD-10-CM

## 2020-10-17 DIAGNOSIS — C50919 Malignant neoplasm of unspecified site of unspecified female breast: Secondary | ICD-10-CM

## 2020-10-17 DIAGNOSIS — D0511 Intraductal carcinoma in situ of right breast: Secondary | ICD-10-CM | POA: Diagnosis not present

## 2020-10-17 HISTORY — DX: Malignant neoplasm of unspecified site of unspecified female breast: C50.919

## 2020-10-22 ENCOUNTER — Telehealth: Payer: Self-pay | Admitting: *Deleted

## 2020-10-22 ENCOUNTER — Ambulatory Visit: Payer: Self-pay | Admitting: General Surgery

## 2020-10-22 DIAGNOSIS — D0511 Intraductal carcinoma in situ of right breast: Secondary | ICD-10-CM | POA: Diagnosis not present

## 2020-10-22 NOTE — Telephone Encounter (Signed)
Pt called back and scheduled an appt with Dr. Marlou Porch for 10/23/20 at 3:20

## 2020-10-22 NOTE — Telephone Encounter (Signed)
Pt called back and has been scheduled to see Dr. Marlou Porch 10/23/20 for pre op assessment. I will forward notes to MD for upcoming appt. I will remove from the pre op call back pool.

## 2020-10-22 NOTE — Telephone Encounter (Signed)
I tried to reach pt to see if she would like to be seen sooner for pre op clearance for breast lumpectomy. I called to offer Dr. Marlou Porch 10/23/20 @ 3:20. Pt is currently scheduled to see Dr. Marlou Porch 11/08/20, was calling to see if she wanted to mover her appt up sooner. Left message to call back ASAP as I cannot promise the appt will still be available when she calls back. If we cannot get her in sooner then we will leave the appt with Dr. Marlou Porch 11/08/20 as planned.

## 2020-10-22 NOTE — Telephone Encounter (Signed)
   Primary Cardiologist: Candee Furbish, MD  Chart reviewed as part of pre-operative protocol coverage. Because of [name]'s past medical history and time since last visit, she will require a follow-up visit in order to better assess preoperative cardiovascular risk.  Pre-op covering staff: - Please schedule appointment and call patient to inform them. If patient already had an upcoming appointment within acceptable timeframe, please add "pre-op clearance" to the appointment notes so provider is aware. - Please contact requesting surgeon's office via preferred method (i.e, phone, fax) to inform them of need for appointment prior to surgery.  If applicable, this message will also be routed to pharmacy pool and/or primary cardiologist for input on holding anticoagulant/antiplatelet agent as requested below so that this information is available to the clearing provider at time of patient's appointment.   [place], [place]  10/22/2020, 9:56 AM

## 2020-10-22 NOTE — Telephone Encounter (Signed)
   [facility] Pre-operative Risk Assessment    HEARTCARE STAFF: - Please ensure there is not already an duplicate clearance open for this procedure. - Under Visit Info/Reason for Call, type in Other and utilize the format Clearance MM/DD/YY or Clearance TBD. Do not use dashes or single digits. - If request is for dental extraction, please clarify the # of teeth to be extracted.  Request for surgical clearance:  1. What type of surgery is being performed? BREAST LUMPECTOMY   2. When is this surgery scheduled? TBD   3. What type of clearance is required (medical clearance vs. Pharmacy clearance to hold med vs. Both)? MEDICAL  4. Are there any medications that need to be held prior to surgery and how long? ASA    5. Practice name and name of physician performing surgery? [facility]; DR. PAUL TOTH   6. What is the office phone number? [phone]   7.   What is the office fax number? [place]: [place], CMA  8.   Anesthesia type (None, local, MAC, general) ? GENERAL   Julaine Hua 10/22/2020, 9:44 AM  _________________________________________________________________   (provider comments below)

## 2020-10-23 ENCOUNTER — Ambulatory Visit: Payer: Medicare HMO | Admitting: Cardiology

## 2020-10-23 ENCOUNTER — Encounter: Payer: Self-pay | Admitting: Cardiology

## 2020-10-23 ENCOUNTER — Other Ambulatory Visit: Payer: Self-pay

## 2020-10-23 VITALS — BP 130/80 | HR 68 | Ht 66.0 in | Wt 178.0 lb

## 2020-10-23 DIAGNOSIS — Z01810 Encounter for preprocedural cardiovascular examination: Secondary | ICD-10-CM

## 2020-10-23 DIAGNOSIS — E782 Mixed hyperlipidemia: Secondary | ICD-10-CM

## 2020-10-23 DIAGNOSIS — I2510 Atherosclerotic heart disease of native coronary artery without angina pectoris: Secondary | ICD-10-CM | POA: Diagnosis not present

## 2020-10-23 DIAGNOSIS — I10 Essential (primary) hypertension: Secondary | ICD-10-CM | POA: Diagnosis not present

## 2020-10-23 DIAGNOSIS — I2583 Coronary atherosclerosis due to lipid rich plaque: Secondary | ICD-10-CM

## 2020-10-23 NOTE — Progress Notes (Signed)
Cardiology Office Note:    Date:  10/23/2020   ID:  [name], DOB [birth year], MRN [id number]  PCP:  Orpah Melter, MD  [facility] Cardiologist:  Candee Furbish, MD  [facility] Electrophysiologist:  None   Referring MD: Orpah Melter, MD     History of Present Illness:    [name] is a 74 y.o. female here for preoperative surgical evaluation, right-sided breast cancer at the request of Dr. Marlou Starks. Has operated on [name], her husband for his cancer.   Coronary artery disease status post CABG in 2012 Cardiac catheterization 02/2012-vein grafts were occluded, LIMA to LAD was patent, circumflex stent was placed DES.  Denies any fevers chills nausea vomiting syncope bleeding.  Able to complete greater than 4 METS of activity.  She states that she could walk a city block for instance.  Overall she has been doing very well.  Denies any fevers chills nausea vomiting syncope bleeding.  No anginal symptoms.  She did suffer a fall where she tripped over a lip in the bathroom at her church and hit her head on a bathroom stall.  She still feels that she occasionally is having some slurred speech.  Getting further neurologic work-up soon.  Past Medical History:  Diagnosis Date  . Allergic rhinitis   . CAD (coronary artery disease)    , s/p CAGB 01/2011- LIMA to LAD patent, all other vein grafts occluded. Circumflex DES 3/13 placed following lateral ischemia on nuclear stress test  . Cervical spondylosis    , bulging  discs  . Disc herniation    L4-L5, L5-S1  . Hypertension   . Mixed hyperlipidemia   . Obesity   . Postmenopausal   . Prediabetes     Past Surgical History:  Procedure Laterality Date  . Bone Spur Left    Foot  . BREAST BIOPSY    . BREAST ENHANCEMENT SURGERY    . BREAST IMPLANT EXCHANGE     replaced rupture implant  . COLONOSCOPY    . CORONARY ARTERY BYPASS GRAFT    . Left Bunionectomy    . LEFT HEART CATHETERIZATION WITH CORONARY/GRAFT  ANGIOGRAM  02/05/2012   Procedure: LEFT HEART CATHETERIZATION WITH Beatrix Fetters;  Surgeon: Candee Furbish, MD;  Location: [facility] CATH LAB;  Service: Cardiovascular;;  . PARTIAL HYSTERECTOMY    . REPLACEMENT TOTAL KNEE Right   . TONSILLECTOMY    . TUBAL LIGATION Bilateral     Current Medications: Current Meds  Medication Sig  . aspirin 81 MG tablet Take 81 mg by mouth daily.  Marland Kitchen atenolol (TENORMIN) 50 MG tablet TAKE 1 TABLET BY MOUTH EVERY DAY  . CALCIUM PO Take 1,200 mg by mouth daily.   . furosemide (LASIX) 20 MG tablet Take 1 tablet (20 mg total) by mouth daily as needed.  . Potassium Gluconate 550 (90 K) MG TABS Take 550 mg by mouth 2 (two) times daily before a meal.  . PRESCRIPTION MEDICATION as directed. Pain medication prescribed by orthopedic doctor - unsure of med name / strength  . rosuvastatin (CRESTOR) 20 MG tablet TAKE 1 TABLET BY MOUTH EVERY DAY     Allergies:   Norvasc [amlodipine besylate], Latex, Lipitor [atorvastatin], Lisinopril, Penicillins, Sulfa antibiotics, Tape, and Tetanus toxoids   Social History   Socioeconomic History  . Marital status: Married    Spouse name: Not on file  . Number of children: Not on file  . Years of education: Not on file  . Highest education level: Not  on file  Occupational History  . Not on file  Tobacco Use  . Smoking status: Never Smoker  . Smokeless tobacco: Never Used  Vaping Use  . Vaping Use: Never used  Substance and Sexual Activity  . Alcohol use: Yes    Comment: Daily  . Drug use: No  . Sexual activity: Not on file  Other Topics Concern  . Not on file  Social History Narrative  . Not on file   Social Determinants of Health   Financial Resource Strain:   . Difficulty of Paying Living Expenses: Not on file  Food Insecurity:   . Worried About Charity fundraiser in the Last Year: Not on file  . Ran Out of Food in the Last Year: Not on file  Transportation Needs:   . Lack of Transportation (Medical): Not on  file  . Lack of Transportation (Non-Medical): Not on file  Physical Activity:   . Days of Exercise per Week: Not on file  . Minutes of Exercise per Session: Not on file  Stress:   . Feeling of Stress : Not on file  Social Connections:   . Frequency of Communication with Friends and Family: Not on file  . Frequency of Social Gatherings with Friends and Family: Not on file  . Attends Religious Services: Not on file  . Active Member of Clubs or Organizations: Not on file  . Attends Archivist Meetings: Not on file  . Marital Status: Not on file     Family History: The patient's family history includes Heart attack in her father; Heart disease in her brother, brother, and mother; Hypertension in her mother.  ROS:   Please see the history of present illness.     All other systems reviewed and are negative.  EKGs/Labs/Other Studies Reviewed:    The following studies were reviewed today:  Echocardiogram 12/19/2015: - Left ventricle: The cavity size was normal. Wall thickness was  normal. Systolic function was normal. The estimated ejection  fraction was in the range of 55% to 60%. Wall motion was normal;  there were no regional wall motion abnormalities. Features are  consistent with a pseudonormal left ventricular filling pattern,  with concomitant abnormal relaxation and increased filling  pressure (grade 2 diastolic dysfunction). Doppler parameters are  consistent with high ventricular filling pressure.  - Left atrium: The atrium was mildly dilated.  - Pulmonary arteries: Systolic pressure was mildly increased.   Impressions:   - Normal LV systolic function; grade 2 diastolic dysfunction with  elevated LV filling pressure; mild LAE; mild TR with mildly  elevated pulmonary pressure.   EKG:  EKG is  ordered today.  The ekg ordered today demonstrates sinus rhythm 68 PAC 11/09/2019-sinus rhythm first-degree AV block PAC QTC 497  Recent Labs: No  results found for requested labs within last 8760 hours.  Recent Lipid Panel    Component Value Date/Time   CHOL 147 06/11/2014 1029   TRIG 146.0 06/11/2014 1029   HDL 41.40 06/11/2014 1029   CHOLHDL 4 06/11/2014 1029   VLDL 29.2 06/11/2014 1029   LDLCALC 76 06/11/2014 1029     Risk Assessment/Calculations:       Physical Exam:    VS:  BP 130/80   Pulse 68   Ht 5\' 6"  (1.676 m)   Wt 178 lb (80.7 kg)   SpO2 97%   BMI 28.73 kg/m     Wt Readings from Last 3 Encounters:  10/23/20 178 lb (80.7 kg)  11/09/19 191 lb (86.6 kg)  11/08/18 199 lb 6.4 oz (90.4 kg)     GEN:  Well nourished, well developed in no acute distress HEENT: Normal NECK: No JVD; No carotid bruits LYMPHATICS: No lymphadenopathy CARDIAC: RRR, no murmurs, rubs, gallops RESPIRATORY:  Clear to auscultation without rales, wheezing or rhonchi  ABDOMEN: Soft, non-tender, non-distended MUSCULOSKELETAL:  No edema; No deformity  SKIN: Warm and dry NEUROLOGIC:  Alert and oriented x 3 PSYCHIATRIC:  Normal affect   ASSESSMENT:    1. Coronary artery disease due to lipid rich plaque   2. Essential hypertension, benign   3. Mixed hyperlipidemia   4. Preop cardiovascular exam    PLAN:    In order of problems listed above:  Preoperative risk stratification prior to right sided breast cancer surgery -She may proceed with mild cardiovascular risk based mainly upon her prior CAD.  She is able to complete greater than 4 METS of activity.  No further cardiac testing warranted.  If absolutely necessary can hold aspirin for 5 days prior.  Coronary artery disease status post CABG -LIMA to LAD is patent but other 3 vein grafts are down -In 2013 drug-eluting stent was placed to circumflex. -No anginal symptoms currently well controlled with beta-blocker, atenolol.  First-degree AV block -Note she is on atenolol.  Being careful.  Would not increase her AV nodal blockade  Hyperlipidemia -On Crestor high intensity 20  mg.  LDL 67. -Remember that blood work is challenging for her, she likes getting it done at Dr. Olen Pel office since the use a special pediatric needle and take it from her hand.  Hip pain -Has had physical therapy sessions in the past.  Continue to exercise.     Shared Decision Making/Informed Consent        Medication Adjustments/Labs and Tests Ordered: Current medicines are reviewed at length with the patient today.  Concerns regarding medicines are outlined above.  Orders Placed This Encounter  Procedures  . EKG 12-Lead   No orders of the defined types were placed in this encounter.   Patient Instructions  Medication Instructions:   Your physician recommends that you continue on your current medications as directed. Please refer to the Current Medication list given to you today.  *If you need a refill on your cardiac medications before your next appointment, please call your pharmacy*  Follow-Up: At [facility], you and your health needs are our priority.  As part of our continuing mission to provide you with exceptional heart care, we have created designated Provider Care Teams.  These Care Teams include your primary Cardiologist (physician) and Advanced Practice Providers (APPs -  Physician Assistants and Nurse Practitioners) who all work together to provide you with the care you need, when you need it.  We recommend signing up for the patient portal called "MyChart".  Sign up information is provided on this After Visit Summary.  MyChart is used to connect with patients for Virtual Visits (Telemedicine).  Patients are able to view lab/test results, encounter notes, upcoming appointments, etc.  Non-urgent messages can be sent to your provider as well.   To learn more about what you can do with MyChart, go to [email or web address].    Your next appointment:   1 year(s)  The format for your next appointment:   In Person  Provider:   Candee Furbish, MD         Signed, Candee Furbish, MD  10/23/2020 4:48 PM    [place]

## 2020-10-23 NOTE — Telephone Encounter (Signed)
Pt just saw Dr. Marlou Porch in the clinic for surgical clearance.  Per Dr. Marlou Porch, pt can proceed with planned surgery and is at a low risk from a cardiac standpoint.

## 2020-10-23 NOTE — Telephone Encounter (Signed)
Per OV note "She may proceed with mild to moderate cardiovascular risk based mainly upon her prior CAD.  She is able to complete greater than 4 METS of activity". Patient can hold ASA for 5-7 days prior.

## 2020-10-23 NOTE — Patient Instructions (Signed)
Medication Instructions:  ? ?Your physician recommends that you continue on your current medications as directed. Please refer to the Current Medication list given to you today. ? ?*If you need a refill on your cardiac medications before your next appointment, please call your pharmacy* ? ? ?Follow-Up: ?At [facility], you and your health needs are our priority.  As part of our continuing mission to provide you with exceptional heart care, we have created designated Provider Care Teams.  These Care Teams include your primary Cardiologist (physician) and Advanced Practice Providers (APPs -  Physician Assistants and Nurse Practitioners) who all work together to provide you with the care you need, when you need it. ? ?We recommend signing up for the patient portal called "MyChart".  Sign up information is provided on this After Visit Summary.  MyChart is used to connect with patients for Virtual Visits (Telemedicine).  Patients are able to view lab/test results, encounter notes, upcoming appointments, etc.  Non-urgent messages can be sent to your provider as well.   ?To learn more about what you can do with MyChart, go to [email or web address].   ? ?Your next appointment:   ?1 year(s) ? ?The format for your next appointment:   ?In Person ? ?Provider:   ?Mark Skains, MD { ? ? ? ?

## 2020-10-24 ENCOUNTER — Other Ambulatory Visit: Payer: Self-pay | Admitting: General Surgery

## 2020-10-24 DIAGNOSIS — D0511 Intraductal carcinoma in situ of right breast: Secondary | ICD-10-CM

## 2020-10-29 ENCOUNTER — Telehealth: Payer: Self-pay | Admitting: Hematology

## 2020-10-29 NOTE — Telephone Encounter (Signed)
Scheduled appointment per 11/23 new patient referral. Called patient, no answer. Left message for patient to call back to confirm appointment date and time.

## 2020-11-04 ENCOUNTER — Other Ambulatory Visit: Payer: Self-pay | Admitting: General Surgery

## 2020-11-04 ENCOUNTER — Other Ambulatory Visit: Payer: Self-pay

## 2020-11-04 ENCOUNTER — Ambulatory Visit
Admission: RE | Admit: 2020-11-04 | Discharge: 2020-11-04 | Disposition: A | Discharge status: Home / Self Care | Payer: Medicare HMO | Source: Ambulatory Visit | Attending: General Surgery | Admitting: General Surgery

## 2020-11-04 DIAGNOSIS — R921 Mammographic calcification found on diagnostic imaging of breast: Secondary | ICD-10-CM | POA: Diagnosis not present

## 2020-11-04 DIAGNOSIS — D0511 Intraductal carcinoma in situ of right breast: Secondary | ICD-10-CM

## 2020-11-04 MED ORDER — GADOBUTROL 1 MMOL/ML IV SOLN
8.0000 mL | Freq: Once | INTRAVENOUS | Status: AC | PRN
  Administered 2020-11-04: 8 mL via INTRAVENOUS

## 2020-11-04 NOTE — Progress Notes (Signed)
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  Histology per Pathology Report: Right Breast 10/17/2020  Receptor Status: ER(5% + weak), PR (0% -), Her2-neu (), Ki-()    Past/Anticipated interventions by surgeon, if any: Dr. Marlou Starks 10/22/2020 -At this point she favors breast conservation which I feel is very reasonable. -Given the diagnosis of DCIS and its proximity to her implant I would like to evaluate her with MRI to get more accurate sense of the area involved. -I will refer her to medical and radiation oncology to discuss adjuvant therapy. -Lumpectomy 12/11/2020   Past/Anticipated interventions by medical oncology, if any: Chemotherapy   Lymphedema issues, if any: No  Pain issues, if any: None noted.  She has a lot of bruising at the incision site.  SAFETY ISSUES:  Prior radiation? No  Pacemaker/ICD? No  Possible current pregnancy? Hysterectomy  Is the patient on methotrexate? No  Current Complaints / other details:   -Bilateral breast implants -Had a bad fall in July 2021, ended up with 5 stitches in her forehead.  She has some neurologic symptoms still present.  She speaks slowly.     Cori Razor, RN 11/04/2020,9:14 AM

## 2020-11-05 ENCOUNTER — Encounter: Payer: Self-pay | Admitting: Radiation Oncology

## 2020-11-05 ENCOUNTER — Ambulatory Visit
Admission: RE | Admit: 2020-11-05 | Discharge: 2020-11-05 | Disposition: A | Discharge status: Home / Self Care | Payer: Medicare HMO | Source: Ambulatory Visit | Attending: Radiation Oncology | Admitting: Radiation Oncology

## 2020-11-05 ENCOUNTER — Other Ambulatory Visit: Payer: Self-pay

## 2020-11-05 DIAGNOSIS — Z170 Estrogen receptor positive status [ER+]: Secondary | ICD-10-CM | POA: Insufficient documentation

## 2020-11-05 DIAGNOSIS — E785 Hyperlipidemia, unspecified: Secondary | ICD-10-CM | POA: Insufficient documentation

## 2020-11-05 DIAGNOSIS — D0511 Intraductal carcinoma in situ of right breast: Secondary | ICD-10-CM | POA: Diagnosis not present

## 2020-11-05 DIAGNOSIS — M5127 Other intervertebral disc displacement, lumbosacral region: Secondary | ICD-10-CM | POA: Insufficient documentation

## 2020-11-05 DIAGNOSIS — I10 Essential (primary) hypertension: Secondary | ICD-10-CM | POA: Diagnosis not present

## 2020-11-05 DIAGNOSIS — Z79899 Other long term (current) drug therapy: Secondary | ICD-10-CM | POA: Insufficient documentation

## 2020-11-05 DIAGNOSIS — Z7982 Long term (current) use of aspirin: Secondary | ICD-10-CM | POA: Diagnosis not present

## 2020-11-05 DIAGNOSIS — E669 Obesity, unspecified: Secondary | ICD-10-CM | POA: Diagnosis not present

## 2020-11-05 DIAGNOSIS — I2510 Atherosclerotic heart disease of native coronary artery without angina pectoris: Secondary | ICD-10-CM | POA: Diagnosis not present

## 2020-11-05 HISTORY — DX: Intraductal carcinoma in situ of right breast: D05.11

## 2020-11-05 NOTE — Progress Notes (Signed)
Radiation Oncology         [phone] ________________________________  Name: [name]        MRN: [id number]  Date of Service: 11/05/2020 DOB: [birth year]  MB:[id number], Annie Main, MD  Jovita Kussmaul, MD     REFERRING PHYSICIAN: Jovita Kussmaul, MD   DIAGNOSIS: The encounter diagnosis was Ductal carcinoma in situ (DCIS) of right breast.   HISTORY OF PRESENT ILLNESS: [name] is a 74 y.o. female seen at the request of Dr. Marlou Starks for a new right breast cancer.  The patient was found to have a screening detected area of calcifications in the upper outer quadrant of the right breast near prepectoral implants.  Further diagnostic imaging measured this area at 1.9 cm.  She underwent a right breast biopsy on 10/17/2020 which revealed high-grade DCIS, her tumor was ER positive at 5% weak to moderate staining intensity PR was negative.  She met with Dr. Marlou Starks and given the location with her implant he has recommended an MRI for more understanding of disease extent.  She has been counseled on the rationale for cardiac clearance prior to surgery, and is interested in breast conservation surgery.  Her MRI was performed yesterday but has not been read yet.  She is seen today to discuss options of adjuvant radiotherapy.  She is scheduled to meet with Dr. Jana Hakim in medical oncology on 11/14/2020.   PREVIOUS RADIATION THERAPY: No   PAST MEDICAL HISTORY:  Past Medical History:  Diagnosis Date  . Allergic rhinitis   . CAD (coronary artery disease)    , s/p CAGB 01/2011- LIMA to LAD patent, all other vein grafts occluded. Circumflex DES 3/13 placed following lateral ischemia on nuclear stress test  . Cervical spondylosis    , bulging  discs  . Disc herniation    L4-L5, L5-S1  . Hypertension   . Mixed hyperlipidemia   . Obesity   . Postmenopausal   . Prediabetes        PAST SURGICAL HISTORY: Past Surgical History:  Procedure Laterality Date  . Bone Spur Left    Foot  .  BREAST BIOPSY    . BREAST ENHANCEMENT SURGERY    . BREAST IMPLANT EXCHANGE     replaced rupture implant  . COLONOSCOPY    . CORONARY ARTERY BYPASS GRAFT    . Left Bunionectomy    . LEFT HEART CATHETERIZATION WITH CORONARY/GRAFT ANGIOGRAM  02/05/2012   Procedure: LEFT HEART CATHETERIZATION WITH Beatrix Fetters;  Surgeon: Candee Furbish, MD;  Location: [facility] CATH LAB;  Service: Cardiovascular;;  . PARTIAL HYSTERECTOMY    . REPLACEMENT TOTAL KNEE Right   . TONSILLECTOMY    . TUBAL LIGATION Bilateral      FAMILY HISTORY:  Family History  Problem Relation Age of Onset  . Heart disease Mother   . Hypertension Mother   . Heart attack Father   . Heart disease Brother   . Heart disease Brother      SOCIAL HISTORY:  reports that she has never smoked. She has never used smokeless tobacco. She reports current alcohol use. She reports that she does not use drugs. The patient is married. She resides in [place]. She is familiar with radiation as her husband was treated for lymphoma many years ago and she's helped neighbors in bringing them for treatment.   ALLERGIES: Norvasc [amlodipine besylate], Latex, Lipitor [atorvastatin], Lisinopril, Penicillins, Sulfa antibiotics, Tape, and Tetanus toxoids   MEDICATIONS:  Current Outpatient Medications  Medication Sig Dispense Refill  .  aspirin 81 MG tablet Take 81 mg by mouth daily.    Marland Kitchen atenolol (TENORMIN) 50 MG tablet TAKE 1 TABLET BY MOUTH EVERY DAY 90 tablet 3  . CALCIUM PO Take 1,200 mg by mouth daily.     . furosemide (LASIX) 20 MG tablet Take 1 tablet (20 mg total) by mouth daily as needed. 90 tablet 0  . Potassium Gluconate 550 (90 K) MG TABS Take 550 mg by mouth 2 (two) times daily before a meal.    . PRESCRIPTION MEDICATION as directed. Pain medication prescribed by orthopedic doctor - unsure of med name / strength    . rosuvastatin (CRESTOR) 20 MG tablet TAKE 1 TABLET BY MOUTH EVERY DAY 90 tablet 3   No current  facility-administered medications for this encounter.     REVIEW OF SYSTEMS: On review of systems, the patient reports that she is doing well overall. She had a fall a few months ago that resulted in some chronic instability and has some residual neurologic deficits and now walks with a cane and sleeps slowly.       PHYSICAL EXAM:  Wt Readings from Last 3 Encounters:  10/23/20 178 lb (80.7 kg)  11/09/19 191 lb (86.6 kg)  11/08/18 199 lb 6.4 oz (90.4 kg)   Temp Readings from Last 3 Encounters:  02/06/12 98.1 F (36.7 C) (Oral)   BP Readings from Last 3 Encounters:  10/23/20 130/80  11/09/19 132/78  11/08/18 137/85   Pulse Readings from Last 3 Encounters:  10/23/20 68  11/09/19 69  11/08/18 65    In general this is a well appearing caucasian female in no acute distress. She's alert and oriented x4 and appropriate throughout the examination. Cardiopulmonary assessment is negative for acute distress and she exhibits normal effort. Bilateral breast exam is deferred.    ECOG = 0  0 - Asymptomatic (Fully active, able to carry on all predisease activities without restriction)  1 - Symptomatic but completely ambulatory (Restricted in physically strenuous activity but ambulatory and able to carry out work of a light or sedentary nature. For example, light housework, office work)  2 - Symptomatic, <50% in bed during the day (Ambulatory and capable of all self care but unable to carry out any work activities. Up and about more than 50% of waking hours)  3 - Symptomatic, >50% in bed, but not bedbound (Capable of only limited self-care, confined to bed or chair 50% or more of waking hours)  4 - Bedbound (Completely disabled. Cannot carry on any self-care. Totally confined to bed or chair)  5 - Death   Eustace Pen MM, Creech RH, Tormey DC, et al. [phone]). "Toxicity and response criteria of the [facility]". [place] Oncol. 5 (6): 649-55    LABORATORY DATA:  Lab  Results  Component Value Date   WBC 7.4 12/11/2015   HGB 15.5 (H) 12/11/2015   HCT 45.1 12/11/2015   MCV 94.5 12/11/2015   PLT 223 12/11/2015   Lab Results  Component Value Date   NA 139 02/25/2016   K 3.9 02/25/2016   CL 97 (L) 02/25/2016   CO2 25 02/25/2016   Lab Results  Component Value Date   ALT 34 06/11/2014   AST 31 06/11/2014   ALKPHOS 30 (L) 06/11/2014   BILITOT 0.5 06/11/2014      RADIOGRAPHY: US BREAST LTD UNI LEFT INC AXILLA  Result Date: 10/11/2020 CLINICAL DATA:  Two year follow-up of a probable benign left breast asymmetry and  corresponding mass seen on ultrasound. EXAM: DIGITAL DIAGNOSTIC BILATERAL MAMMOGRAM WITH IMPLANTS, CAD AND TOMO ULTRASOUND LEFT BREAST The patient has retroglandular implants. Standard and implant displaced views were performed. COMPARISON:  Previous exam(s). ACR Breast Density Category c: The breast tissue is heterogeneously dense, which may obscure small masses. FINDINGS: The asymmetry seen in the medial aspect of the left breast is less conspicuous on today's images. No suspicious mass or malignant type microcalcifications identified. There are developing punctate calcifications in the far posterior aspect of the upper outer quadrant of the right breast spanning an area of 1.9 cm. They are best visualized on the lateral view. No suspicious mass identified in the right breast. Mammographic images were processed with CAD. Targeted ultrasound is performed, showing a stable hypoechoic mass in the left breast at 9 o'clock 3 cm from the nipple measuring 3 x 3 x 3 mm. On the prior ultrasound dated 10/09/2019 it measured 3 x 2 x 4 mm. IMPRESSION: Indeterminate calcifications the far posterior aspect of the upper-outer quadrant of the right breast. No evidence of malignancy in the left breast. RECOMMENDATION: Attempt at stereotactic biopsy of calcifications in the upper-outer quadrant of the right breast is recommended. The calcifications are in the far  posterior aspect of the breast and best seen on the lateral view. I would attempt stereotactic biopsy from a lateral approach. The patient is aware that the calcifications may not be amenable to biopsy because of their far posterior location. If stereotactic biopsy is not possible I would recommend six-month follow-up mammogram of the calcifications. I have discussed the findings and recommendations with the patient. If applicable, a reminder letter will be sent to the patient regarding the next appointment. BI-RADS CATEGORY  4: Suspicious. Electronically Signed   By: Lillia Mountain M.D.   On: 10/11/2020 12:27   MM DIAG BREAST W/IMPLANT TOMO BILATERAL  Result Date: 10/11/2020 CLINICAL DATA:  Two year follow-up of a probable benign left breast asymmetry and corresponding mass seen on ultrasound. EXAM: DIGITAL DIAGNOSTIC BILATERAL MAMMOGRAM WITH IMPLANTS, CAD AND TOMO ULTRASOUND LEFT BREAST The patient has retroglandular implants. Standard and implant displaced views were performed. COMPARISON:  Previous exam(s). ACR Breast Density Category c: The breast tissue is heterogeneously dense, which may obscure small masses. FINDINGS: The asymmetry seen in the medial aspect of the left breast is less conspicuous on today's images. No suspicious mass or malignant type microcalcifications identified. There are developing punctate calcifications in the far posterior aspect of the upper outer quadrant of the right breast spanning an area of 1.9 cm. They are best visualized on the lateral view. No suspicious mass identified in the right breast. Mammographic images were processed with CAD. Targeted ultrasound is performed, showing a stable hypoechoic mass in the left breast at 9 o'clock 3 cm from the nipple measuring 3 x 3 x 3 mm. On the prior ultrasound dated 10/09/2019 it measured 3 x 2 x 4 mm. IMPRESSION: Indeterminate calcifications the far posterior aspect of the upper-outer quadrant of the right breast. No evidence of  malignancy in the left breast. RECOMMENDATION: Attempt at stereotactic biopsy of calcifications in the upper-outer quadrant of the right breast is recommended. The calcifications are in the far posterior aspect of the breast and best seen on the lateral view. I would attempt stereotactic biopsy from a lateral approach. The patient is aware that the calcifications may not be amenable to biopsy because of their far posterior location. If stereotactic biopsy is not possible I would recommend six-month follow-up  mammogram of the calcifications. I have discussed the findings and recommendations with the patient. If applicable, a reminder letter will be sent to the patient regarding the next appointment. BI-RADS CATEGORY  4: Suspicious. Electronically Signed   By: Lillia Mountain M.D.   On: 10/11/2020 12:27   MM CLIP PLACEMENT RIGHT  Result Date: 10/17/2020 CLINICAL DATA:  Post procedure mammogram for clip placement. EXAM: DIAGNOSTIC RIGHT MAMMOGRAM POST STEREOTACTIC BIOPSY COMPARISON:  Previous exam(s). FINDINGS: Mammographic images were obtained following stereotactic guided biopsy of calcifications in the upper outer right breast. The coil biopsy marking clip is in expected position at the site of biopsy. IMPRESSION: Appropriate positioning of the coil shaped biopsy marking clip at the site of biopsy in the upper outer right breast. Final Assessment: Post Procedure Mammograms for Marker Placement Electronically Signed   By: Audie Pinto M.D.   On: 10/17/2020 11:14   MM RT BREAST BX W LOC DEV 1ST LESION IMAGE BX SPEC STEREO GUIDE  Addendum Date: 10/22/2020   ADDENDUM REPORT: 10/21/2020 15:26 ADDENDUM: Pathology revealed HIGH GRADE DUCTAL CARCINOMA IN SITU, CALCIFICATIONS of the RIGHT breast, upper outer. This was found to be concordant by Dr. Audie Pinto. Pathology results were discussed with the patient by telephone with Terie Purser RN. The patient reported doing well after the biopsy with tenderness  at the site. Post biopsy instructions and care were reviewed and questions were answered. The patient was encouraged to call [facility] for any additional concerns. Surgical consultation has been arranged with Dr. Autumn Messing at [facility] on October 22, 2020. Consider breast MRI given high grade histology. Pathology results reported by Stacie Acres RN on 10/21/2020. Electronically Signed   By: Audie Pinto M.D.   On: 10/21/2020 15:26   Result Date: 10/22/2020 CLINICAL DATA:  74 year old female presenting for biopsy of calcifications in the right breast. EXAM: RIGHT BREAST STEREOTACTIC CORE NEEDLE BIOPSY COMPARISON:  Previous exams. FINDINGS: The patient and I discussed the procedure of stereotactic-guided biopsy including benefits and alternatives. We discussed the high likelihood of a successful procedure. We discussed the risks of the procedure including infection, bleeding, tissue injury, clip migration, and inadequate sampling. Informed written consent was given. The usual time out protocol was performed immediately prior to the procedure. Using sterile technique and 1% Lidocaine as local anesthetic, under stereotactic guidance, a 9 gauge vacuum assisted device was used to perform core needle biopsy of calcifications in the upper outer right breast using a lateral approach. Specimen radiograph was performed showing at least 5 specimens with calcifications. Specimens with calcifications are identified for pathology. Lesion quadrant: Upper outer quadrant At the conclusion of the procedure, a coil tissue marker clip was deployed into the biopsy cavity. Follow-up 2-view mammogram was performed and dictated separately. IMPRESSION: Stereotactic-guided biopsy of calcifications in the upper outer right breast. No apparent complications. Electronically Signed: By: Audie Pinto M.D. On: 10/17/2020 11:16       IMPRESSION/PLAN: 1. Weakly ER positive high grade DCIS  of the right breast. Dr. Lisbeth Renshaw discusses the pathology findings and reviews the nature of noninvasive breast disease. We will follow up with the final read on her MRI but the patient plans for conservation with lumpectomy. We reviewed the rationale for external radiotherapy to the breast  to reduce risks of local recurrence followed by antiestrogen therapy. We discussed the risks, benefits, short, and long term effects of radiotherapy, as well as the curative intent, and the patient is interested in proceeding.  Dr. Lisbeth Renshaw discusses the delivery and logistics of radiotherapy and anticipates a course of 6 1/2 weeks of radiotherapy given her insitu implants to reduce risks of radiation fibrosis and capsular compromise and implant failure. We will see her back a few weeks after surgery to discuss the simulation process and anticipate we starting radiotherapy about 4-6 weeks after surgery.    In a visit lasting 60 minutes, greater than 50% of the time was spent face to face reviewing her case, as well as in preparation of, discussing, and coordinating the patient's care.  The above documentation reflects my direct findings during this shared patient visit. Please see the separate note by Dr. Lisbeth Renshaw on this date for the remainder of the patient's plan of care.    Carola Rhine, PAC

## 2020-11-06 ENCOUNTER — Ambulatory Visit: Payer: Medicare HMO | Admitting: Hematology

## 2020-11-07 ENCOUNTER — Other Ambulatory Visit: Payer: Self-pay | Admitting: Cardiology

## 2020-11-08 ENCOUNTER — Ambulatory Visit: Payer: Medicare HMO | Admitting: Cardiology

## 2020-11-09 ENCOUNTER — Other Ambulatory Visit: Payer: Self-pay | Admitting: Cardiology

## 2020-11-13 ENCOUNTER — Other Ambulatory Visit: Payer: Self-pay | Admitting: *Deleted

## 2020-11-13 DIAGNOSIS — D0511 Intraductal carcinoma in situ of right breast: Secondary | ICD-10-CM

## 2020-11-13 NOTE — Progress Notes (Signed)
[place]  [phone] [phone]     ID: [name] DOB: 05/02/1946  MR#: [id number]  HQI#:[id number]  Patient Care Team: Orpah Melter, MD as PCP - General (Family Medicine) Jerline Pain, MD as PCP - Cardiology (Cardiology) Kyung Rudd, MD as Consulting Physician (Radiation Oncology) Jovita Kussmaul, MD as Consulting Physician (General Surgery) Chaitra Mast, Virgie Dad, MD as Consulting Physician (Oncology) Chauncey Cruel, MD OTHER MD:  CHIEF COMPLAINT: Ductal carcinoma in situ  CURRENT TREATMENT: Waiting definitive surgery   HISTORY OF CURRENT ILLNESS: [name] had been under follow up for probable benign left breast asymmetry and corresponding mass. She underwent two year follow up bilateral diagnostic mammography with tomography and left breast ultrasonography at [facility] on 10/11/2020 showing: breast density category C; 1.9 cm indeterminate calcifications in far posterior aspect of upper-outer right breast; stable hypoechoic mass in left breast at 9 o'clock.  Accordingly on 10/17/2020 she proceeded to biopsy of the right breast area in question. The pathology from this procedure ([id number]) showed: ductal carcinoma in situ, high grade, with calcifications. Prognostic indicators significant for: estrogen receptor, 5% positive with moderate-weak staining intensity and progesterone receptor, 0% negative.   Breast MRI performed on 11/04/2020 showed: breast composition B; 5 cm post-biopsy hematoma within upper-outer right breast at site of biopsy-proven DCIS; minima;l adjacent streaky enhancement along this hematoma is noted; no other suspicious abnormalities noted within either breast or lymph nodes; bilateral retroglandular implants.  The patient's subsequent history is as detailed below.   INTERVAL HISTORY: [name] was evaluated in the multidisciplinary breast cancer clinic on 11/14/2020. Her case was also presented  at the multidisciplinary breast cancer conference on 10/23/2020. At that time a preliminary plan was proposed: MRI because the calcium deposits are very close to her implants, otherwise breast conserving surgery, adjuvant radiation, and consideration of antiestrogens.  She met with Dr. Lisbeth Renshaw on 11/05/2020 to discuss adjuvant radiation therapy. The plan is to proceed with treatment following surgery.  She is scheduled for right lumpectomy on 1/5/20222 under Dr. Marlou Starks.   REVIEW OF SYSTEMS: The patient had a concussion in July 2021 which has left her with slow speech and some dizziness.  She now uses a cane.  However there have been no falls.  She tells me she has an appointment with neurology at sometime in the future (Dr. Tomi Likens). There has been no cough, phlegm production, or pleurisy, no chest pain or pressure, and no change in bowel or bladder habits. The patient denies fever, rash, bleeding, unexplained fatigue or unexplained weight loss. A detailed review of systems was otherwise entirely negative.   COVID 19 VACCINATION STATUS: Pfizer x2 plus booster November 2021   PAST MEDICAL HISTORY: Past Medical History:  Diagnosis Date  . Allergic rhinitis   . Breast cancer ([place]) 10/17/2020  . CAD (coronary artery disease)    , s/p CAGB 01/2011- LIMA to LAD patent, all other vein grafts occluded. Circumflex DES 3/13 placed following lateral ischemia on nuclear stress test  . Cervical spondylosis    , bulging  discs  . Disc herniation    L4-L5, L5-S1  . Hypertension   . Mixed hyperlipidemia   . Obesity   . Postmenopausal   . Prediabetes     PAST SURGICAL HISTORY: Past Surgical History:  Procedure Laterality Date  . Bone Spur Left    Foot  . BREAST BIOPSY    . BREAST ENHANCEMENT SURGERY    . BREAST IMPLANT EXCHANGE  replaced rupture implant  . COLONOSCOPY    . CORONARY ARTERY BYPASS GRAFT    . Left Bunionectomy    . LEFT HEART CATHETERIZATION WITH CORONARY/GRAFT ANGIOGRAM   02/05/2012   Procedure: LEFT HEART CATHETERIZATION WITH Beatrix Fetters;  Surgeon: Candee Furbish, MD;  Location: [facility] CATH LAB;  Service: Cardiovascular;;  . PARTIAL HYSTERECTOMY    . REPLACEMENT TOTAL KNEE Right   . TONSILLECTOMY    . TUBAL LIGATION Bilateral     FAMILY HISTORY: Family History  Problem Relation Age of Onset  . Heart disease Mother   . Hypertension Mother   . Heart attack Father   . Heart disease Brother   . Heart disease Brother   . Breast cancer Paternal Grandmother   . Breast cancer Paternal 99   The patient's father died at age 74 from heart disease.  The patient's mother died from old age at 86.  Patient had 2 brothers, no sisters.  The paternal grandmother and a paternal aunt both had breast cancer.  There is no history of prostate or ovarian cancer in the family   GYNECOLOGIC HISTORY:  No LMP recorded. Patient has had a hysterectomy. Menarche: 75 years old Age at first live birth: 74 years old [place] P 3 (the first 1 stillborn) LMP hysterectomy 1999 HRT no Hysterectomy? 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  At home is just the 2 of them plus there poodle not.  Son [name] lives in [place] where he is an Chief Financial Officer.  Son [name] lives in [place] where he also is an Chief Financial Officer.  The patient has 6 grandchildren.  She attends [employer]    ADVANCED DIRECTIVES: In the absence of any documents to the contrary the patient's husband is her healthcare power of attorney   HEALTH MAINTENANCE: Social History   Tobacco Use  . Smoking status: Never Smoker  . Smokeless tobacco: Never Used  Vaping Use  . Vaping Use: Never used  Substance Use Topics  . Alcohol use: Yes    Comment: Occasionally  . Drug use: No     Colonoscopy:   PAP: Status post  Bone density: Remote   Allergies  Allergen Reactions  . Norvasc  [Amlodipine Besylate] Other (See Comments)    Edema   . Latex Itching and Rash  . Lipitor [Atorvastatin] Other (See Comments)    Muscle aches  . Lisinopril Swelling and Cough  . Penicillins Rash  . Sulfa Antibiotics Rash  . Tape Itching and Rash  . Tetanus Toxoids Other (See Comments)    Patient said "Didn't do well".    Current Outpatient Medications  Medication Sig Dispense Refill  . acetaminophen (TYLENOL) 650 MG CR tablet 1 tablet as needed    . aspirin 81 MG tablet Take 81 mg by mouth daily.    Marland Kitchen atenolol (TENORMIN) 50 MG tablet TAKE 1 TABLET BY MOUTH EVERY DAY 90 tablet 3  . furosemide (LASIX) 20 MG tablet Take 1 tablet (20 mg total) by mouth daily as needed. 90 tablet 0  . Potassium Gluconate 550 (90 K) MG TABS Take 550 mg by mouth daily.    Marland Kitchen PRESCRIPTION MEDICATION as directed. Pain medication prescribed by orthopedic doctor - unsure of med name / strength    . rosuvastatin (CRESTOR) 20 MG tablet TAKE 1 TABLET BY MOUTH EVERY DAY 90 tablet 3  . B Complex  Vitamins (VITAMIN B COMPLEX PO) See admin instructions.    . Prenatal Vit-Fe Fumarate-FA (M-VIT PO) 1 tab     No current facility-administered medications for this visit.    OBJECTIVE: White woman who walks with a cane  Vitals:   11/14/20 1552  BP: 134/63  Pulse: (!) 58  Resp: 17  Temp: 97.9 F (36.6 C)  SpO2: 100%     Body mass index is 28.68 kg/m.   Wt Readings from Last 3 Encounters:  11/14/20 177 lb 11.2 oz (80.6 kg)  11/05/20 178 lb 3.2 oz (80.8 kg)  10/23/20 178 lb (80.7 kg)      ECOG FS:1 - Symptomatic but completely ambulatory  Ocular: Sclerae unicteric Ear-nose-throat: Wearing a mask Lymphatic: No cervical or supraclavicular adenopathy Lungs no rales or rhonchi Heart regular rate and rhythm Abd soft, nontender, positive bowel sounds MSK no focal spinal tenderness, no joint edema;  Neuro: non-focal, well-oriented, appropriate affect, balance is poor Breasts: The right breast is status post  recent biopsy.  There is a moderate ecchymosis.  The left breast is benign.  Both axillae are benign.   LAB RESULTS:  CMP     Component Value Date/Time   NA 139 02/25/2016 1507   K 3.9 02/25/2016 1507   CL 97 (L) 02/25/2016 1507   CO2 25 02/25/2016 1507   GLUCOSE 103 (H) 02/25/2016 1507   BUN 13 02/25/2016 1507   CREATININE 0.77 02/25/2016 1507   CALCIUM 9.0 02/25/2016 1507   PROT 7.6 06/11/2014 1029   ALBUMIN 4.1 06/11/2014 1029   AST 31 06/11/2014 1029   ALT 34 06/11/2014 1029   ALKPHOS 30 (L) 06/11/2014 1029   BILITOT 0.5 06/11/2014 1029   GFRNONAA 88 (L) 02/06/2012 0500   GFRAA >90 02/06/2012 0500    No results found for: TOTALPROTELP, ALBUMINELP, A1GS, A2GS, BETS, BETA2SER, GAMS, MSPIKE, SPEI  Lab Results  Component Value Date   WBC 8.3 11/14/2020   NEUTROABS 4.7 11/14/2020   HGB 16.8 (H) 11/14/2020   HCT 50.2 (H) 11/14/2020   MCV 95.4 11/14/2020   PLT 208 11/14/2020    No results found for: LABCA2  No components found for: DGLOVF643  No results for input(s): INR in the last 168 hours.  No results found for: LABCA2  No results found for: PIR518  No results found for: ACZ660  No results found for: YTK160  No results found for: CA2729  No components found for: HGQUANT  No results found for: CEA1 / No results found for: CEA1   No results found for: AFPTUMOR  No results found for: CHROMOGRNA  No results found for: KPAFRELGTCHN, LAMBDASER, KAPLAMBRATIO (kappa/lambda light chains)  No results found for: HGBA, HGBA2QUANT, HGBFQUANT, HGBSQUAN (Hemoglobinopathy evaluation)   No results found for: LDH  No results found for: IRON, TIBC, IRONPCTSAT (Iron and TIBC)  No results found for: FERRITIN  Urinalysis    Component Value Date/Time   COLORURINE YELLOW 01/23/2011 1427   APPEARANCEUR CLEAR 01/23/2011 1427   LABSPEC 1.022 01/23/2011 1427   PHURINE 5.5 01/23/2011 1427   GLUCOSEU NEGATIVE 02/15/2008 1318   HGBUR NEGATIVE 01/23/2011 1427    BILIRUBINUR NEGATIVE 01/23/2011 1427   KETONESUR NEGATIVE 01/23/2011 1427   PROTEINUR NEGATIVE 01/23/2011 1427   UROBILINOGEN 0.2 01/23/2011 1427   NITRITE NEGATIVE 01/23/2011 1427   LEUKOCYTESUR  01/23/2011 1427    NEGATIVE MICROSCOPIC NOT DONE ON URINES WITH NEGATIVE PROTEIN, BLOOD, LEUKOCYTES, NITRITE, OR GLUCOSE <1000 mg/dL.     STUDIES: MR BREAST BILATERAL  W WO CONTRAST INC CAD  Result Date: 11/05/2020 CLINICAL DATA:  74 year old female with recently diagnosed RIGHT breast DCIS. DCIS calcifications span a distance of 1.9 cm mammographically. LABS:  None performed today EXAM: BILATERAL BREAST MRI WITH AND WITHOUT CONTRAST TECHNIQUE: Multiplanar, multisequence MR images of both breasts were obtained prior to and following the intravenous administration of 8 ml of Gadavist Three-dimensional MR images were rendered by post-processing of the original MR data on an independent workstation. The three-dimensional MR images were interpreted, and findings are reported in the following complete MRI report for this study. Three dimensional images were evaluated at the independent interpreting workstation using the DynaCAD thin client. COMPARISON:  Prior mammograms and ultrasounds FINDINGS: Breast composition: b. Scattered fibroglandular tissue. Background parenchymal enhancement: Minimal Right breast: A retroglandular implant is noted. A 2 x 5 cm post biopsy hematoma containing biopsy clip artifact is noted within the anterior UPPER-OUTER RIGHT breast. Minimal adjacent streaky enhancement is noted. No other suspicious abnormalities are noted within the RIGHT breast. Left breast: A retroglandular implant is noted. No mass or suspicious enhancement. Lymph nodes: No abnormal appearing lymph nodes. Ancillary findings:  None. IMPRESSION: 1. 2 x 5 cm post biopsy hematoma within the anterior UPPER-OUTER RIGHT breast at the site of biopsy-proven DCIS. Minimal adjacent streaky enhancement along this hematoma is noted  which may represent DCIS and/or post biopsy changes. 2. No other suspicious abnormalities noted within either breast. 3. Bilateral retroglandular implants. RECOMMENDATION: Treatment plan for RIGHT breast cancer BI-RADS CATEGORY  6: Known biopsy-proven malignancy. Electronically Signed   By: Margarette Canada M.D.   On: 11/05/2020 14:34   MM CLIP PLACEMENT RIGHT  Result Date: 10/17/2020 CLINICAL DATA:  Post procedure mammogram for clip placement. EXAM: DIAGNOSTIC RIGHT MAMMOGRAM POST STEREOTACTIC BIOPSY COMPARISON:  Previous exam(s). FINDINGS: Mammographic images were obtained following stereotactic guided biopsy of calcifications in the upper outer right breast. The coil biopsy marking clip is in expected position at the site of biopsy. IMPRESSION: Appropriate positioning of the coil shaped biopsy marking clip at the site of biopsy in the upper outer right breast. Final Assessment: Post Procedure Mammograms for Marker Placement Electronically Signed   By: Audie Pinto M.D.   On: 10/17/2020 11:14   MM RT BREAST BX W LOC DEV 1ST LESION IMAGE BX SPEC STEREO GUIDE  Addendum Date: 10/22/2020   ADDENDUM REPORT: 10/21/2020 15:26 ADDENDUM: Pathology revealed HIGH GRADE DUCTAL CARCINOMA IN SITU, CALCIFICATIONS of the RIGHT breast, upper outer. This was found to be concordant by Dr. Audie Pinto. Pathology results were discussed with the patient by telephone with Terie Purser RN. The patient reported doing well after the biopsy with tenderness at the site. Post biopsy instructions and care were reviewed and questions were answered. The patient was encouraged to call [facility] for any additional concerns. Surgical consultation has been arranged with Dr. Autumn Messing at [facility] on October 22, 2020. Consider breast MRI given high grade histology. Pathology results reported by Stacie Acres RN on 10/21/2020. Electronically Signed   By: Audie Pinto M.D.   On: 10/21/2020  15:26   Result Date: 10/22/2020 CLINICAL DATA:  74 year old female presenting for biopsy of calcifications in the right breast. EXAM: RIGHT BREAST STEREOTACTIC CORE NEEDLE BIOPSY COMPARISON:  Previous exams. FINDINGS: The patient and I discussed the procedure of stereotactic-guided biopsy including benefits and alternatives. We discussed the high likelihood of a successful procedure. We discussed the risks of the procedure including infection, bleeding, tissue injury,  clip migration, and inadequate sampling. Informed written consent was given. The usual time out protocol was performed immediately prior to the procedure. Using sterile technique and 1% Lidocaine as local anesthetic, under stereotactic guidance, a 9 gauge vacuum assisted device was used to perform core needle biopsy of calcifications in the upper outer right breast using a lateral approach. Specimen radiograph was performed showing at least 5 specimens with calcifications. Specimens with calcifications are identified for pathology. Lesion quadrant: Upper outer quadrant At the conclusion of the procedure, a coil tissue marker clip was deployed into the biopsy cavity. Follow-up 2-view mammogram was performed and dictated separately. IMPRESSION: Stereotactic-guided biopsy of calcifications in the upper outer right breast. No apparent complications. Electronically Signed: By: Audie Pinto M.D. On: 10/17/2020 11:16     ELIGIBLE FOR AVAILABLE RESEARCH PROTOCOL: no  ASSESSMENT: 74 y.o. [employer] woman status post right breast biopsy 10/17/2020 for ductal carcinoma in situ grade 3, weakly estrogen receptor positive at 5%, progesterone receptor negative.  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 Specifically we discussed the biology of her breast  cancer, its diagnosis, staging, treatment  options and prognosis. Twylla understands that in noninvasive ductal carcinoma, also called ductal carcinoma in situ ("DCIS") the breast cancer cells remain trapped in the ducts were they started. They cannot travel to a vital organ. For that reason these cancers in themselves are not life-threatening.  If the whole breast is removed then all the ducts are removed and since the cancer cells are trapped in the ducts, the cure rate with mastectomy for noninvasive breast cancer is approximately 99%. Nevertheless we recommend lumpectomy, because there is no survival advantage to mastectomy and because the cosmetic result is generally superior with breast conservation.  Since the patient is keeping her breasts, there will be some risk of recurrence. The recurrence can only be in the same breast since, again, the cells are trapped in the ducts. There is no connection from one breast to the other. The risk of local recurrence is cut by more than half with radiation, which is standard in this situation.  In estrogen receptor positive cancers anti-estrogens can also be considered.  However Brigid's breast cancer is only minimally estrogen receptor positive and progesterone receptor negative.  I do not believe she would obtain any benefit from either tamoxifen or anastrozole or similar drugs.  These drugs do have their own set of concerns including blood clots and osteoporosis.  Accordingly antiestrogens are not recommended in her case.   I normally do not follow estrogen receptor negative ductal carcinoma in situ cases, but bridges hemoglobin is greater than 16 and that does require evaluation.  In addition she does meet criteria for genetics.  Accordingly I will set her up for genetics blood draw when she comes in for radiation sometime in January.  She will then see me in February and we will review her hemoglobin issue again at that time  Total encounter time 60  minutes.Sarajane Jews C. Evelio Rueda, MD 11/14/2020 4:19 PM Medical Oncology and Hematology [facility] [place], [place] [address] Tel. [phone]    Fax. [phone]   This document serves as a record of services personally performed by Lurline Del, MD. It was created on his behalf by Wilburn Mylar, a trained medical scribe. The creation  of this record is based on the scribe's personal observations and the provider's statements to them.   I, Lurline Del MD, have reviewed the above documentation for accuracy and completeness, and I agree with the above.    *Total Encounter Time as defined by the Centers for Medicare and Medicaid Services includes, in addition to the face-to-face time of a patient visit (documented in the note above) non-face-to-face time: obtaining and reviewing outside history, ordering and reviewing medications, tests or procedures, care coordination (communications with other health care professionals or caregivers) and documentation in the medical record.

## 2020-11-14 ENCOUNTER — Inpatient Hospital Stay: Payer: Medicare HMO | Attending: Hematology | Admitting: Oncology

## 2020-11-14 ENCOUNTER — Other Ambulatory Visit: Payer: Self-pay

## 2020-11-14 ENCOUNTER — Inpatient Hospital Stay: Payer: Medicare HMO

## 2020-11-14 VITALS — BP 134/63 | HR 58 | Temp 97.9°F | Resp 17 | Ht 66.0 in | Wt 177.7 lb

## 2020-11-14 DIAGNOSIS — D751 Secondary polycythemia: Secondary | ICD-10-CM | POA: Insufficient documentation

## 2020-11-14 DIAGNOSIS — I10 Essential (primary) hypertension: Secondary | ICD-10-CM | POA: Insufficient documentation

## 2020-11-14 DIAGNOSIS — Z170 Estrogen receptor positive status [ER+]: Secondary | ICD-10-CM | POA: Diagnosis not present

## 2020-11-14 DIAGNOSIS — I2510 Atherosclerotic heart disease of native coronary artery without angina pectoris: Secondary | ICD-10-CM | POA: Diagnosis not present

## 2020-11-14 DIAGNOSIS — E785 Hyperlipidemia, unspecified: Secondary | ICD-10-CM | POA: Diagnosis not present

## 2020-11-14 DIAGNOSIS — D0511 Intraductal carcinoma in situ of right breast: Secondary | ICD-10-CM

## 2020-11-14 DIAGNOSIS — Z79899 Other long term (current) drug therapy: Secondary | ICD-10-CM | POA: Insufficient documentation

## 2020-11-14 HISTORY — DX: Secondary polycythemia: D75.1

## 2020-11-14 LAB — CBC WITH DIFFERENTIAL (CANCER CENTER ONLY)
Abs Immature Granulocytes: 0.02 10*3/uL (ref 0.00–0.07)
Basophils Absolute: 0.1 10*3/uL (ref 0.0–0.1)
Basophils Relative: 1 %
Eosinophils Absolute: 0.1 10*3/uL (ref 0.0–0.5)
Eosinophils Relative: 1 %
HCT: 50.2 % — ABNORMAL HIGH (ref 36.0–46.0)
Hemoglobin: 16.8 g/dL — ABNORMAL HIGH (ref 12.0–15.0)
Immature Granulocytes: 0 %
Lymphocytes Relative: 31 %
Lymphs Abs: 2.6 10*3/uL (ref 0.7–4.0)
MCH: 31.9 pg (ref 26.0–34.0)
MCHC: 33.5 g/dL (ref 30.0–36.0)
MCV: 95.4 fL (ref 80.0–100.0)
Monocytes Absolute: 0.8 10*3/uL (ref 0.1–1.0)
Monocytes Relative: 10 %
Neutro Abs: 4.7 10*3/uL (ref 1.7–7.7)
Neutrophils Relative %: 57 %
Platelet Count: 208 10*3/uL (ref 150–400)
RBC: 5.26 MIL/uL — ABNORMAL HIGH (ref 3.87–5.11)
RDW: 12.6 % (ref 11.5–15.5)
WBC Count: 8.3 10*3/uL (ref 4.0–10.5)
nRBC: 0 % (ref 0.0–0.2)

## 2020-11-14 LAB — CMP (CANCER CENTER ONLY)
ALT: 28 U/L (ref 0–44)
AST: 26 U/L (ref 15–41)
Albumin: 4.3 g/dL (ref 3.5–5.0)
Alkaline Phosphatase: 41 U/L (ref 38–126)
Anion gap: 13 (ref 5–15)
BUN: 17 mg/dL (ref 8–23)
CO2: 27 mmol/L (ref 22–32)
Calcium: 10.1 mg/dL (ref 8.9–10.3)
Chloride: 101 mmol/L (ref 98–111)
Creatinine: 0.9 mg/dL (ref 0.44–1.00)
GFR, Estimated: >60 mL/min (ref >60)
Glucose, Bld: 117 mg/dL — ABNORMAL HIGH (ref 70–99)
Potassium: 3.3 mmol/L — ABNORMAL LOW (ref 3.5–5.1)
Sodium: 141 mmol/L (ref 135–145)
Total Bilirubin: 0.7 mg/dL (ref 0.3–1.2)
Total Protein: 8.4 g/dL — ABNORMAL HIGH (ref 6.5–8.1)

## 2020-11-15 ENCOUNTER — Telehealth: Payer: Self-pay | Admitting: Oncology

## 2020-11-15 NOTE — Telephone Encounter (Signed)
Scheduled appts per 12/9 los. Unable to leave voicemail. Pt to get updated appt calendar at next visit per appt notes.

## 2020-11-18 ENCOUNTER — Inpatient Hospital Stay (HOSPITAL_BASED_OUTPATIENT_CLINIC_OR_DEPARTMENT_OTHER): Payer: Medicare HMO | Admitting: Genetic Counselor

## 2020-11-18 ENCOUNTER — Inpatient Hospital Stay: Payer: Medicare HMO

## 2020-11-18 ENCOUNTER — Encounter: Payer: Self-pay | Admitting: Genetic Counselor

## 2020-11-18 ENCOUNTER — Encounter: Payer: Self-pay | Admitting: *Deleted

## 2020-11-18 ENCOUNTER — Other Ambulatory Visit: Payer: Self-pay | Admitting: Genetic Counselor

## 2020-11-18 ENCOUNTER — Other Ambulatory Visit: Payer: Self-pay

## 2020-11-18 DIAGNOSIS — D0511 Intraductal carcinoma in situ of right breast: Secondary | ICD-10-CM

## 2020-11-18 DIAGNOSIS — Z1379 Encounter for other screening for genetic and chromosomal anomalies: Secondary | ICD-10-CM | POA: Diagnosis not present

## 2020-11-18 DIAGNOSIS — Z803 Family history of malignant neoplasm of breast: Secondary | ICD-10-CM | POA: Diagnosis not present

## 2020-11-18 DIAGNOSIS — D751 Secondary polycythemia: Secondary | ICD-10-CM

## 2020-11-18 HISTORY — DX: Family history of malignant neoplasm of breast: Z80.3

## 2020-11-18 LAB — COMPREHENSIVE METABOLIC PANEL
ALT: 41 U/L (ref 0–44)
AST: 33 U/L (ref 15–41)
Albumin: 4.2 g/dL (ref 3.5–5.0)
Alkaline Phosphatase: 41 U/L (ref 38–126)
Anion gap: 9 (ref 5–15)
BUN: 10 mg/dL (ref 8–23)
CO2: 29 mmol/L (ref 22–32)
Calcium: 9.9 mg/dL (ref 8.9–10.3)
Chloride: 104 mmol/L (ref 98–111)
Creatinine, Ser: 0.83 mg/dL (ref 0.44–1.00)
GFR, Estimated: >60 mL/min (ref >60)
Glucose, Bld: 98 mg/dL (ref 70–99)
Potassium: 3.6 mmol/L (ref 3.5–5.1)
Sodium: 142 mmol/L (ref 135–145)
Total Bilirubin: 0.7 mg/dL (ref 0.3–1.2)
Total Protein: 8 g/dL (ref 6.5–8.1)

## 2020-11-18 LAB — CBC WITH DIFFERENTIAL/PLATELET
Abs Immature Granulocytes: 0.01 10*3/uL (ref 0.00–0.07)
Basophils Absolute: 0.1 10*3/uL (ref 0.0–0.1)
Basophils Relative: 1 %
Eosinophils Absolute: 0.1 10*3/uL (ref 0.0–0.5)
Eosinophils Relative: 2 %
HCT: 44.4 % (ref 36.0–46.0)
Hemoglobin: 14.5 g/dL (ref 12.0–15.0)
Immature Granulocytes: 0 %
Lymphocytes Relative: 28 %
Lymphs Abs: 2.1 10*3/uL (ref 0.7–4.0)
MCH: 31.2 pg (ref 26.0–34.0)
MCHC: 32.7 g/dL (ref 30.0–36.0)
MCV: 95.5 fL (ref 80.0–100.0)
Monocytes Absolute: 0.6 10*3/uL (ref 0.1–1.0)
Monocytes Relative: 7 %
Neutro Abs: 4.6 10*3/uL (ref 1.7–7.7)
Neutrophils Relative %: 62 %
Platelets: 197 10*3/uL (ref 150–400)
RBC: 4.65 MIL/uL (ref 3.87–5.11)
RDW: 12.8 % (ref 11.5–15.5)
WBC: 7.4 10*3/uL (ref 4.0–10.5)
nRBC: 0 % (ref 0.0–0.2)

## 2020-11-18 LAB — GENETIC SCREENING ORDER

## 2020-11-18 NOTE — Progress Notes (Addendum)
REFERRING PROVIDER: Magrinat, Virgie Dad, MD  PRIMARY PROVIDER:  Orpah Melter, MD  PRIMARY REASON FOR VISIT:  1. Ductal carcinoma in situ (DCIS) of right breast   2. Family history of breast cancer     HISTORY OF PRESENT ILLNESS:   Ms. Giglia, a 74 y.o. female, was seen for a [place] cancer genetics consultation at the request of Dr. Jana Hakim due to a personal and family history of cancer.  Ms. Calvo presents to clinic today to discuss the possibility of a hereditary predisposition to cancer, to discuss genetic testing, and to further clarify her future cancer risks, as well as potential cancer risks for family members.   In 2021, at the age of 85, Ms. Pardue was diagnosed with ductal carcinoma in situ of the right breast. The preliminary treatment plan includes lumpectomy, adjuvant radiation, and consideration for anti-estrogens.   RISK FACTORS:  Menarche was at age 28.  First live birth at age 3.  OCP use for approximately 5 years.  Ovaries intact: yes.  Hysterectomy: yes in 1999 Menopausal status: postmenopausal.  HRT use: 0 years. Colonoscopy: yes; unknown date. Mammogram within the last year: yes. Number of breast biopsies: 1. Up to date with pelvic exams: no longer receiving pelvic exams per patient. Any excessive radiation exposure in the past: no  Past Medical History:  Diagnosis Date  . Allergic rhinitis   . Breast cancer ([place]) 10/17/2020  . CAD (coronary artery disease)    , s/p CAGB 01/2011- LIMA to LAD patent, all other vein grafts occluded. Circumflex DES 3/13 placed following lateral ischemia on nuclear stress test  . Cervical spondylosis    , bulging  discs  . Disc herniation    L4-L5, L5-S1  . Family history of breast cancer 11/18/2020  . Hypertension   . Mixed hyperlipidemia   . Obesity   . Postmenopausal   . Prediabetes     Past Surgical History:  Procedure Laterality Date  . Bone Spur Left    Foot  . BREAST BIOPSY    . BREAST ENHANCEMENT  SURGERY    . BREAST IMPLANT EXCHANGE     replaced rupture implant  . COLONOSCOPY    . CORONARY ARTERY BYPASS GRAFT    . Left Bunionectomy    . LEFT HEART CATHETERIZATION WITH CORONARY/GRAFT ANGIOGRAM  02/05/2012   Procedure: LEFT HEART CATHETERIZATION WITH Beatrix Fetters;  Surgeon: Candee Furbish, MD;  Location: [facility] C[facility] LAB;  Service: Cardiovascular;;  . PARTIAL HYSTERECTOMY    . REPLACEMENT TOTAL KNEE Right   . TONSILLECTOMY    . TUBAL LIGATION Bilateral     Social History   Socioeconomic History  . Marital status: Married    Spouse name: Not on file  . Number of children: Not on file  . Years of education: Not on file  . Highest education level: Not on file  Occupational History  . Not on file  Tobacco Use  . Smoking status: Never Smoker  . Smokeless tobacco: Never Used  Vaping Use  . Vaping Use: Never used  Substance and Sexual Activity  . Alcohol use: Yes    Comment: Occasionally  . Drug use: No  . Sexual activity: Not on file  Other Topics Concern  . Not on file  Social History Narrative  . Not on file   Social Determinants of Health   Financial Resource Strain: Not on file  Food Insecurity: Not on file  Transportation Needs: Not on file  Physical Activity: Not on file  Stress: Not on file  Social Connections: Not on file     FAMILY HISTORY:  We obtained a detailed, 4-generation family history.  Significant diagnoses are listed below: Family History  Problem Relation Age of Onset  . Breast cancer Paternal Grandmother   . Breast cancer Paternal Aunt     Ms. Wehrman has two living sons in their 56s without a cancer history.  Her first born passed away at birth.  Ms. Junious Silk has one full brother, age 7, and one maternal half brother, age 24, both without a cancer history. Ms. Bonk mother passed away at age 51.  No maternal family history of cancer was reported.  Ms. Rachels father passed away at age 45 and did not have cancer.  Ms. Goodridge paternal  aunt and paternal grandmother had breast cancer at an unknown age.   Ms. Salberg is unaware of previous family history of genetic testing for hereditary cancer risks. Patient's maternal ancestors are of [place] descent, and paternal ancestors are of [place] descent. There is no reported Ashkenazi Jewish ancestry. There is no known consanguinity.  GENETIC COUNSELING ASSESSMENT: Ms. Hollin is a 74 y.o. female with a personal and family history of cancer which is somewhat suggestive of a hereditary cancer syndrome and predisposition to cancer given the related cancer diagnoses in her paternal family. We, therefore, discussed and recommended the following at today's visit.   DISCUSSION: We discussed that 5 - 10% of cancer is hereditary, with most cases of hereditary breast cancer associated with mutations in BRCA1/2.  There are other genes that can be associated with hereditary breast cancer syndromes.  Type of cancer risk and level of risk are gene-specific.  We discussed that testing is beneficial for several reasons including knowing how to follow individuals after completing their treatment, identifying whether potential treatment options would be beneficial, and understanding if other family members could be at risk for cancer and allowing them to undergo genetic testing.   We reviewed the characteristics, features and inheritance patterns of hereditary cancer syndromes. We also discussed genetic testing, including the appropriate family members to test, the process of testing, insurance coverage and turn-around-time for results. We discussed the implications of a negative, positive and/or variant of uncertain significant result. In order to get genetic test results in a timely manner so that Ms. Youse can use these genetic test results for surgical decisions, we recommended Ms. Daoud pursue genetic testing for the STAT Breast Cancer Panel.  The STAT Breast cancer panel offered by [facility] includes sequencing  and rearrangement analysis for the following 9 genes:  ATM, BRCA1, BRCA2, CDH1, CHEK2, PALB2, PTEN, STK11 and TP53.  Once complete, we recommend Ms. Witman pursue reflex genetic testing to a more comprehensive gene panel.   Ms. Radloff  was offered a common hereditary cancer panel (48 genes) and an expanded pan-cancer panel (85 genes). Ms. Rhinehart was informed of the benefits and limitations of each panel, including that expanded pan-cancer panels contain several preliminary evidence genes that do not have clear management guidelines at this point in time.  We also discussed that as the number of genes included on a panel increases, the chances of variants of uncertain significance increases.  After considering the benefits and limitations of each gene panel, Ms. Gritz  elected to have a common hereditary cancer panel through [facility].  The Common Hereditary Cancers Panel offered by [facility] includes sequencing and/or deletion duplication testing of the following 48 genes: APC, ATM, AXIN2, BARD1, BMPR1A, BRCA1, BRCA2, BRIP1,  CDH1, CDK4, CDKN2A (p14ARF), CDKN2A (p16INK4a), CHEK2, CTNNA1, DICER1, EPCAM (Deletion/duplication testing only), GREM1 (promoter region deletion/duplication testing only), KIT, MEN1, MLH1, MSH2, MSH3, MSH6, MUTYH, NBN, NF1, NHTL1, PALB2, PDGFRA, PMS2, POLD1, POLE, PTEN, RAD50, RAD51C, RAD51D, RNF43, SDHB, SDHC, SDHD, SMAD4, SMARCA4. STK11, TP53, TSC1, TSC2, and VHL.  The following genes were evaluated for sequence changes only: SDHA and HOXB13 c.251G>A variant only.  Based on Ms. Frappier's personal and family history of cancer, she meets medical criteria for genetic testing. Despite that she meets criteria, she may still have an out of pocket cost. We discussed that if her out of pocket cost for testing is over $100, the laboratory will call and confirm whether she wants to proceed with testing.  If the out of pocket cost of testing is less than $100 she will be billed by the genetic testing  laboratory.   PLAN: After considering the risks, benefits, and limitations, Ms. Chandran provided informed consent to pursue genetic testing and the blood sample was sent to [facility] for analysis of the STAT+Common Hereditary Cancers Panel. Results should be available within approximately 1-2 weeks' time, at which point they will be disclosed by telephone to Ms. Haan, as will any additional recommendations warranted by these results. Ms. Rantz will receive a summary of her genetic counseling visit and a copy of her results once available. This information will also be available in Epic.    Lastly, we encouraged Ms. Distler to remain in contact with cancer genetics annually so that we can continuously update the family history and inform her of any changes in cancer genetics and testing that may be of benefit for this family.   Ms. Vath questions were answered to her satisfaction today. Our contact information was provided should additional questions or concerns arise. Thank you for the referral and allowing [place] to share in the care of your patient.   Sultana Tierney M. Joette Catching, [place], [facility] Genetic Counselor [email or web address] (P) [phone]  The patient was seen for a total of 40 minutes in face-to-face genetic counseling.  Drs. Magrinat, Lindi Adie and/or Burr Medico were available to discuss this case as needed.   _______________________________________________________________________ For Office Staff:  Number of people involved in session: 1 Was an Intern/ student involved with case: no

## 2020-12-01 NOTE — Progress Notes (Signed)
NEUROLOGY CONSULTATION NOTE  Quinley Jesko MRN: [id number] DOB: 11/10/1946  Referring provider: Orpah Melter, MD Primary care provider: Orpah Melter, MD  Reason for consult:  Postconcussion syndrome   Subjective:  [name] is a 74 year old right-handed female with  Recently diagnosed noninvasive breast cancer who presents for postconcussion syndrome.  History supplemented by PCP notes.  Accompanied by her husband.  On 06/30/2020, she tripped and fell in the bathroom at church, hitting her head on the bathroom stall sustaining a scalp laceration.  She was seen in Urgent Care where she and her spouse reported that she did not lose consciousness and denied headache, blurred vision, nausea, vomiting, dizziness or lightheadedness.  When she returned to Urgent Care on 07/10/2020 for suture removal, she still denied headache. However, she has reported lightheadedness and feeling dizzy particularly in changing positions.  She is therefore scared to drive.  She reports problems with memory, having to write things down.  She reports word-finding issues and speech is slurred.  She also has trouble writing.  Handwriting is smaller.  It takes prolonged period of time to write things down.  She is still able to pay the bills but takes a while to complete task.  She also cries easily.  She has been using a cane as she has been unsteady. There has been no improvement in symptoms but they have not gotten worse either.    MRI of brain with and without contrast on 09/06/2020 personally reviewed showed mild generalized cerebral atrophy and chronic small vessel ischemic changes but no acute intracranial abnormality.  She hit her head after tripping on the driveway in [id number] but no concussion or LOC with that event.     PAST MEDICAL HISTORY: Past Medical History:  Diagnosis Date  . Allergic rhinitis   . Breast cancer ([place]) 10/17/2020  . CAD (coronary artery disease)    , s/p CAGB 01/2011-  LIMA to LAD patent, all other vein grafts occluded. Circumflex DES 3/13 placed following lateral ischemia on nuclear stress test  . Cervical spondylosis    , bulging  discs  . Disc herniation    L4-L5, L5-S1  . Family history of breast cancer 11/18/2020  . Hypertension   . Mixed hyperlipidemia   . Obesity   . Postmenopausal   . Prediabetes     PAST SURGICAL HISTORY: Past Surgical History:  Procedure Laterality Date  . Bone Spur Left    Foot  . BREAST BIOPSY    . BREAST ENHANCEMENT SURGERY    . BREAST IMPLANT EXCHANGE     replaced rupture implant  . COLONOSCOPY    . CORONARY ARTERY BYPASS GRAFT    . Left Bunionectomy    . LEFT HEART CATHETERIZATION WITH CORONARY/GRAFT ANGIOGRAM  02/05/2012   Procedure: LEFT HEART CATHETERIZATION WITH Beatrix Fetters;  Surgeon: Candee Furbish, MD;  Location: [facility] CATH LAB;  Service: Cardiovascular;;  . PARTIAL HYSTERECTOMY    . REPLACEMENT TOTAL KNEE Right   . TONSILLECTOMY    . TUBAL LIGATION Bilateral     MEDICATIONS: Current Outpatient Medications on File Prior to Visit  Medication Sig Dispense Refill  . acetaminophen (TYLENOL) 650 MG CR tablet 1 tablet as needed    . aspirin 81 MG tablet Take 81 mg by mouth daily.    Marland Kitchen atenolol (TENORMIN) 50 MG tablet TAKE 1 TABLET BY MOUTH EVERY DAY 90 tablet 3  . B Complex Vitamins (VITAMIN B COMPLEX PO) See admin instructions.    Marland Kitchen  furosemide (LASIX) 20 MG tablet Take 1 tablet (20 mg total) by mouth daily as needed. 90 tablet 0  . Potassium Gluconate 550 (90 K) MG TABS Take 550 mg by mouth daily.    . Prenatal Vit-Fe Fumarate-FA (M-VIT PO) 1 tab    . PRESCRIPTION MEDICATION as directed. Pain medication prescribed by orthopedic doctor - unsure of med name / strength    . rosuvastatin (CRESTOR) 20 MG tablet TAKE 1 TABLET BY MOUTH EVERY DAY 90 tablet 3   No current facility-administered medications on file prior to visit.    ALLERGIES: Allergies  Allergen Reactions  . Norvasc [Amlodipine  Besylate] Other (See Comments)    Edema   . Latex Itching and Rash  . Lipitor [Atorvastatin] Other (See Comments)    Muscle aches  . Lisinopril Swelling and Cough  . Penicillins Rash  . Sulfa Antibiotics Rash  . Tape Itching and Rash  . Tetanus Toxoids Other (See Comments)    Patient said "Didn't do well".    FAMILY HISTORY: Family History  Problem Relation Age of Onset  . Heart disease Mother   . Hypertension Mother   . Heart attack Father   . Heart disease Brother   . Heart disease Brother   . Breast cancer Paternal Grandmother        dx unknown age  . Breast cancer Paternal Aunt        dx unknown age    SOCIAL HISTORY: Social History   Socioeconomic History  . Marital status: Married    Spouse name: Not on file  . Number of children: Not on file  . Years of education: Not on file  . Highest education level: Not on file  Occupational History  . Not on file  Tobacco Use  . Smoking status: Never Smoker  . Smokeless tobacco: Never Used  Vaping Use  . Vaping Use: Never used  Substance and Sexual Activity  . Alcohol use: Yes    Comment: Occasionally  . Drug use: No  . Sexual activity: Not on file  Other Topics Concern  . Not on file  Social History Narrative  . Not on file   Social Determinants of Health   Financial Resource Strain: Not on file  Food Insecurity: Not on file  Transportation Needs: Not on file  Physical Activity: Not on file  Stress: Not on file  Social Connections: Not on file  Intimate Partner Violence: Not At Risk  . Fear of Current or Ex-Partner: No  . Emotionally Abused: No  . Physically Abused: No  . Sexually Abused: No    Objective:  Blood pressure (!) 143/86, pulse 75, resp. rate 18, height 5\' 6"  (1.676 m), weight 180 lb (81.6 kg), SpO2 96 %. General: No acute distress.  Patient appears well-groomed.   Head:  Normocephalic/atraumatic Eyes:  fundi examined but not visualized Neck: supple, no paraspinal tenderness, full range  of motion Back: No paraspinal tenderness Heart: regular rate and rhythm Lungs: Clear to auscultation bilaterally. Vascular: No carotid bruits. Neurological Exam: Mental status:  [employer] Mental Exam 12/02/2020  Weekday Correct 1  Current year 1  What state are we in? 1  Amount spent 1  Amount left 0  # of Animals 2  5 objects recall 3  Number series 2  Hour markers 2  Time correct 0  Placed X in triangle correctly 1  Largest Figure 1  Name of female 0  Date back to work 0  Type of work  2  State she lived in 0  Total score 17   Cranial nerves: CN I: not tested CN II: pupils equal, round and reactive to light, visual fields intact CN III, IV, VI:  full range of motion, no nystagmus, no ptosis CN V: facial sensation intact. CN VII: upper and lower face symmetric CN VIII: hearing intact CN IX, X: gag intact, uvula midline CN XI: sternocleidomastoid and trapezius muscles intact CN XII: tongue midline Bulk & Tone: normal, no fasciculations. Motor:  muscle strength 5/5 throughout Sensation:  Pinprick sensation reduced in left lower extremity below knee, reduced vibratory sensation in feet (left greater than right) Deep Tendon Reflexes:  3+ throughout,  bilateral Hoffman, left Babinski, right toes downgoing.   Finger to nose testing:  Without dysmetria.   Heel to shin:  Without dysmetria.   Gait:  Cautious wide-based.  Romberg with sway.    Assessment/Plan:   1. Postconcussion syndrome 2.  Hyperreflexia, Hoffman, Babinski - concern for possible myelopathy - unclear if she had a disc protrusion or arthritis in the cervical spine that caused spinal cord trauma when she fell.  She will need neuro-rehab:  Physical therapy, vestibular rehab, speech therapy.  Defers antidepressant for now.  She is starting lumpectomy and radiation therapy next week.  We will hold off on physical therapy until after treatment is completed.  But I do want to check MRI now.    1.  MRI of  cervical spine with and without contrast.  If unremarkable then would repeat MRI of brain with and without contrast 2.  Once she finishes radiation therapy, she will contact me for referral to PT/OT/Speech/vestibular rehab 3.  Follow up afterwards.  Thank you for allowing me to take part in the care of this patient.  Metta Clines, DO  CC: Orpah Melter, MD

## 2020-12-02 ENCOUNTER — Encounter (HOSPITAL_BASED_OUTPATIENT_CLINIC_OR_DEPARTMENT_OTHER): Payer: Self-pay | Admitting: General Surgery

## 2020-12-02 ENCOUNTER — Encounter: Payer: Self-pay | Admitting: Neurology

## 2020-12-02 ENCOUNTER — Ambulatory Visit: Payer: Medicare HMO | Admitting: Neurology

## 2020-12-02 ENCOUNTER — Other Ambulatory Visit: Payer: Self-pay

## 2020-12-02 ENCOUNTER — Telehealth: Payer: Self-pay | Admitting: Licensed Clinical Social Worker

## 2020-12-02 VITALS — BP 143/86 | HR 75 | Resp 18 | Ht 66.0 in | Wt 180.0 lb

## 2020-12-02 DIAGNOSIS — R69 Illness, unspecified: Secondary | ICD-10-CM | POA: Diagnosis not present

## 2020-12-02 DIAGNOSIS — R292 Abnormal reflex: Secondary | ICD-10-CM

## 2020-12-02 DIAGNOSIS — R2681 Unsteadiness on feet: Secondary | ICD-10-CM | POA: Diagnosis not present

## 2020-12-02 DIAGNOSIS — F0781 Postconcussional syndrome: Secondary | ICD-10-CM | POA: Diagnosis not present

## 2020-12-02 NOTE — Patient Instructions (Signed)
1.  Will order MRI of cervical spine with and without contrast 2.  Will likely need physical therapy:  Vestibular rehabilitation, neurorehab, speech therapy.  Will hold off until after radiation therapy 3.  Follow up 4 months but contact me when ready for physical therapy.

## 2020-12-03 ENCOUNTER — Encounter (HOSPITAL_BASED_OUTPATIENT_CLINIC_OR_DEPARTMENT_OTHER)
Admission: RE | Admit: 2020-12-03 | Discharge: 2020-12-03 | Disposition: A | Discharge status: Home / Self Care | Payer: Medicare HMO | Source: Ambulatory Visit | Attending: General Surgery | Admitting: General Surgery

## 2020-12-03 DIAGNOSIS — D0511 Intraductal carcinoma in situ of right breast: Secondary | ICD-10-CM | POA: Diagnosis not present

## 2020-12-03 LAB — BASIC METABOLIC PANEL
Anion gap: 10 (ref 5–15)
BUN: 14 mg/dL (ref 8–23)
CO2: 23 mmol/L (ref 22–32)
Calcium: 9.1 mg/dL (ref 8.9–10.3)
Chloride: 105 mmol/L (ref 98–111)
Creatinine, Ser: 0.63 mg/dL (ref 0.44–1.00)
GFR, Estimated: >60 mL/min (ref >60)
Glucose, Bld: 93 mg/dL (ref 70–99)
Potassium: 4.6 mmol/L (ref 3.5–5.1)
Sodium: 138 mmol/L (ref 135–145)

## 2020-12-03 NOTE — Progress Notes (Signed)

## 2020-12-04 ENCOUNTER — Encounter: Payer: Self-pay | Admitting: Genetic Counselor

## 2020-12-04 ENCOUNTER — Telehealth: Payer: Self-pay | Admitting: Genetic Counselor

## 2020-12-04 ENCOUNTER — Ambulatory Visit: Payer: Self-pay | Admitting: Genetic Counselor

## 2020-12-04 DIAGNOSIS — Z1379 Encounter for other screening for genetic and chromosomal anomalies: Secondary | ICD-10-CM

## 2020-12-04 DIAGNOSIS — Z803 Family history of malignant neoplasm of breast: Secondary | ICD-10-CM

## 2020-12-04 DIAGNOSIS — D0511 Intraductal carcinoma in situ of right breast: Secondary | ICD-10-CM

## 2020-12-04 HISTORY — DX: Encounter for other screening for genetic and chromosomal anomalies: Z13.79

## 2020-12-04 NOTE — Telephone Encounter (Signed)
Revealed negative genetic testing.  Discussed that we do not know why she has DCIS or why there is cancer in the family. It could be sporadic, due to a different gene that we are not testing, or maybe our current technology may not be able to pick something up.  It will be important for her to keep in contact with genetics to keep up with whether additional testing may be needed.   

## 2020-12-04 NOTE — Progress Notes (Signed)
HPI:  Ms. Adelson was previously seen in the [place] clinic due to a personal and family history of breast cancer and concerns regarding a hereditary predisposition to cancer. Please refer to our prior cancer genetics clinic note for more information regarding our discussion, assessment and recommendations, at the time. Ms. Mell recent genetic test results were disclosed to her, as were recommendations warranted by these results. These results and recommendations are discussed in more detail below.  CANCER HISTORY:  In 2021, at the age of 74, Ms. 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 The preliminary treatment plan includes lumpectomy, adjuvant radiation, and consideration for anti-estrogens.   Oncology History  Ductal carcinoma in situ (DCIS) of right breast  11/05/2020 Initial Diagnosis   Ductal carcinoma in situ (DCIS) of right breast   11/30/2020 Genetic Testing   Negative genetic testing: no pathogenic variants detected in [facility] Common Hereditary Cancers Panel.  The report date is November 30, 2020.   The Common Hereditary Cancers Panel offered by [facility] includes sequencing and/or deletion duplication testing of the following 48 genes: APC, ATM, AXIN2, BARD1, BMPR1A, BRCA1, BRCA2, BRIP1, CDH1, CDK4, CDKN2A (p14ARF), CDKN2A (p16INK4a), CHEK2, CTNNA1, DICER1, EPCAM (Deletion/duplication testing only), GREM1 (promoter region deletion/duplication testing only), KIT, MEN1, MLH1, MSH2, MSH3, MSH6, MUTYH, NBN, NF1, NHTL1, PALB2, PDGFRA, PMS2, POLD1, POLE, PTEN, RAD50, RAD51C, RAD51D, RNF43, SDHB, SDHC, SDHD, SMAD4, SMARCA4. STK11, TP53, TSC1, TSC2, and VHL.  The following genes were evaluated for sequence changes only: SDHA and HOXB13 c.251G>A variant only.     FAMILY HISTORY:  We obtained a detailed, 4-generation family history.  Significant diagnoses are listed below: Family History  Problem Relation Age of Onset  . Breast cancer  Paternal Grandmother        dx unknown age  . Breast cancer Paternal Aunt        dx unknown age    Ms. Lenis has two living sons in their 51s without a cancer history.  Her first born passed away at birth.  Ms. Junious Silk has one full brother, age 73, and one maternal half brother, age 82, both without a cancer history. Ms. Totton mother passed away at age 58.  No maternal family history of cancer was reported.  Ms. Potier father passed away at age 68 and did not have cancer.  Ms. Betten paternal aunt and paternal grandmother had breast cancer at an unknown age.   Ms. Bert is unaware of previous family history of genetic testing for hereditary cancer risks. Patient's maternal ancestors are of [place] descent, and paternal ancestors are of [place] descent. There is no reported Ashkenazi Jewish ancestry. There is no known consanguinity.  GENETIC TEST RESULTS: Genetic testing reported out on November 30, 2020.  The [facility] Common Hereditary Cancers Panel found no pathogenic mutations. The Common Hereditary Cancers Panel offered by [facility] includes sequencing and/or deletion duplication testing of the following 48 genes: APC, ATM, AXIN2, BARD1, BMPR1A, BRCA1, BRCA2, BRIP1, CDH1, CDK4, CDKN2A (p14ARF), CDKN2A (p16INK4a), CHEK2, CTNNA1, DICER1, EPCAM (Deletion/duplication testing only), GREM1 (promoter region deletion/duplication testing only), KIT, MEN1, MLH1, MSH2, MSH3, MSH6, MUTYH, NBN, NF1, NHTL1, PALB2, PDGFRA, PMS2, POLD1, POLE, PTEN, RAD50, RAD51C, RAD51D, RNF43, SDHB, SDHC, SDHD, SMAD4, SMARCA4. STK11, TP53, TSC1, TSC2, and VHL.  The following genes were evaluated for sequence changes only: SDHA and HOXB13 c.251G>A variant only.  The test report has been scanned into EPIC and is located under the Molecular Pathology section of the Results Review tab.  A portion of the result report is included below for reference.     We discussed with Ms. Sobczyk that because current genetic testing is not  perfect, it is possible there may be a gene mutation in one of these genes that current testing cannot detect, but that chance is small.  We also discussed, that there could be another gene that has not yet been discovered, or that we have not yet tested, that is responsible for the cancer diagnoses in the family. It is also possible there is a hereditary cause for the cancer in the family that Ms. Elliott did not inherit and therefore was not identified in her testing.  Therefore, it is important to remain in touch with cancer genetics in the future so that we can continue to offer Ms. Sackmann the most up to date genetic testing.   ADDITIONAL GENETIC TESTING: We discussed with Ms. Strome that there are other genes that are associated with increased cancer risk that can be analyzed. Should Ms. Ledet wish to pursue additional genetic testing, we are happy to discuss and coordinate this testing, at any time.     CANCER SCREENING RECOMMENDATIONS: Ms. Obyrne test result is considered negative (normal).  This means that we have not identified a hereditary cause for her personal and family history of cancer at this time. Most cancers happen by chance and this negative test suggests that her cancer may fall into this category.    While reassuring, this does not definitively rule out a hereditary predisposition to cancer. It is still possible that there could be genetic mutations that are undetectable by current technology. There could be genetic mutations in genes that have not been tested or identified to increase cancer risk.  Therefore, it is recommended she continue to follow the cancer management and screening guidelines provided by her oncology and primary healthcare provider.   An individual's cancer risk and medical management are not determined by genetic test results alone. Overall cancer risk assessment incorporates additional factors, including personal medical history, family history, and any  available genetic information that may result in a personalized plan for cancer prevention and surveillance  RECOMMENDATIONS FOR FAMILY MEMBERS:  Individuals in this family might be at some increased risk of developing cancer, over the general population risk, simply due to the family history of cancer.  We recommended women in this family have a yearly mammogram beginning at age 72, or 16 years younger than the earliest onset of cancer, an annual clinical breast exam, and perform monthly breast self-exams. Women in this family should also have a gynecological exam as recommended by their primary provider. All family members should be referred for colonoscopy starting at age 49.  FOLLOW-UP: Lastly, we discussed with Ms. Torpey that cancer genetics is a rapidly advancing field and it is possible that new genetic tests will be appropriate for her and/or her family members in the future. We encouraged her to remain in contact with cancer genetics on an annual basis so we can update her personal and family histories and let her know of advances in cancer genetics that may benefit this family.   Our contact number was provided. Ms. Dudas questions were answered to her satisfaction, and she knows she is welcome to call us at anytime with additional questions or concerns.   Yuritzy Zehring M. Joette Catching, [place], [facility] Genetic Counselor [email or web address] (P) [phone]

## 2020-12-09 ENCOUNTER — Other Ambulatory Visit (HOSPITAL_COMMUNITY)
Admission: RE | Admit: 2020-12-09 | Discharge: 2020-12-09 | Disposition: A | Discharge status: Home / Self Care | Payer: Medicare HMO | Source: Ambulatory Visit | Attending: General Surgery | Admitting: General Surgery

## 2020-12-09 DIAGNOSIS — Z01812 Encounter for preprocedural laboratory examination: Secondary | ICD-10-CM | POA: Diagnosis not present

## 2020-12-09 DIAGNOSIS — Z20822 Contact with and (suspected) exposure to covid-19: Secondary | ICD-10-CM | POA: Diagnosis not present

## 2020-12-09 LAB — SARS CORONAVIRUS 2 (TAT 6-24 HRS): SARS Coronavirus 2: NEGATIVE

## 2020-12-09 NOTE — Telephone Encounter (Signed)
Left message that genetic test results are back. Twana First was able to reach Ms. Helming with results.

## 2020-12-10 ENCOUNTER — Ambulatory Visit
Admission: RE | Admit: 2020-12-10 | Discharge: 2020-12-10 | Disposition: A | Discharge status: Home / Self Care | Payer: Medicare HMO | Source: Ambulatory Visit | Attending: General Surgery | Admitting: General Surgery

## 2020-12-10 ENCOUNTER — Other Ambulatory Visit: Payer: Self-pay

## 2020-12-10 DIAGNOSIS — D0511 Intraductal carcinoma in situ of right breast: Secondary | ICD-10-CM

## 2020-12-10 DIAGNOSIS — R928 Other abnormal and inconclusive findings on diagnostic imaging of breast: Secondary | ICD-10-CM | POA: Diagnosis not present

## 2020-12-11 ENCOUNTER — Ambulatory Visit (HOSPITAL_BASED_OUTPATIENT_CLINIC_OR_DEPARTMENT_OTHER): Payer: Medicare HMO | Admitting: Certified Registered"

## 2020-12-11 ENCOUNTER — Encounter (HOSPITAL_BASED_OUTPATIENT_CLINIC_OR_DEPARTMENT_OTHER): Payer: Self-pay | Admitting: General Surgery

## 2020-12-11 ENCOUNTER — Encounter (HOSPITAL_BASED_OUTPATIENT_CLINIC_OR_DEPARTMENT_OTHER)
Admission: RE | Disposition: A | Discharge status: Home / Self Care | Payer: Self-pay | Source: Home / Self Care | Attending: General Surgery

## 2020-12-11 ENCOUNTER — Ambulatory Visit
Admission: RE | Admit: 2020-12-11 | Discharge: 2020-12-11 | Disposition: A | Discharge status: Home / Self Care | Payer: Medicare HMO | Source: Ambulatory Visit | Attending: General Surgery | Admitting: General Surgery

## 2020-12-11 ENCOUNTER — Ambulatory Visit (HOSPITAL_BASED_OUTPATIENT_CLINIC_OR_DEPARTMENT_OTHER)
Admission: RE | Admit: 2020-12-11 | Discharge: 2020-12-11 | Disposition: A | Discharge status: Home / Self Care | Payer: Medicare HMO | Attending: General Surgery | Admitting: General Surgery

## 2020-12-11 ENCOUNTER — Other Ambulatory Visit: Payer: Self-pay

## 2020-12-11 DIAGNOSIS — I10 Essential (primary) hypertension: Secondary | ICD-10-CM | POA: Diagnosis not present

## 2020-12-11 DIAGNOSIS — E782 Mixed hyperlipidemia: Secondary | ICD-10-CM | POA: Diagnosis not present

## 2020-12-11 DIAGNOSIS — D0511 Intraductal carcinoma in situ of right breast: Secondary | ICD-10-CM | POA: Insufficient documentation

## 2020-12-11 DIAGNOSIS — I2510 Atherosclerotic heart disease of native coronary artery without angina pectoris: Secondary | ICD-10-CM | POA: Diagnosis not present

## 2020-12-11 DIAGNOSIS — Z90711 Acquired absence of uterus with remaining cervical stump: Secondary | ICD-10-CM | POA: Insufficient documentation

## 2020-12-11 DIAGNOSIS — Z888 Allergy status to other drugs, medicaments and biological substances status: Secondary | ICD-10-CM | POA: Diagnosis not present

## 2020-12-11 DIAGNOSIS — R928 Other abnormal and inconclusive findings on diagnostic imaging of breast: Secondary | ICD-10-CM | POA: Diagnosis not present

## 2020-12-11 DIAGNOSIS — Z951 Presence of aortocoronary bypass graft: Secondary | ICD-10-CM | POA: Diagnosis not present

## 2020-12-11 DIAGNOSIS — Z880 Allergy status to penicillin: Secondary | ICD-10-CM | POA: Insufficient documentation

## 2020-12-11 DIAGNOSIS — Z803 Family history of malignant neoplasm of breast: Secondary | ICD-10-CM | POA: Insufficient documentation

## 2020-12-11 DIAGNOSIS — Z882 Allergy status to sulfonamides status: Secondary | ICD-10-CM | POA: Diagnosis not present

## 2020-12-11 HISTORY — PX: BREAST LUMPECTOMY WITH RADIOACTIVE SEED LOCALIZATION: SHX6424

## 2020-12-11 HISTORY — DX: Postconcussional syndrome: F07.81

## 2020-12-11 SURGERY — BREAST LUMPECTOMY WITH RADIOACTIVE SEED LOCALIZATION
Anesthesia: General | Site: Breast | Laterality: Right

## 2020-12-11 MED ORDER — OXYCODONE HCL 5 MG PO TABS: 5.0000 mg | ORAL_TABLET | Freq: Once | ORAL | Status: DC | PRN

## 2020-12-11 MED ORDER — CEFAZOLIN SODIUM-DEXTROSE 2-4 GM/100ML-% IV SOLN
INTRAVENOUS | Status: AC
  Filled 2020-12-11: qty 100

## 2020-12-11 MED ORDER — PROPOFOL 10 MG/ML IV BOLUS
INTRAVENOUS | Status: AC
  Filled 2020-12-11: qty 40

## 2020-12-11 MED ORDER — FENTANYL CITRATE (PF) 100 MCG/2ML IJ SOLN
INTRAMUSCULAR | Status: DC | PRN
  Administered 2020-12-11: 25 ug via INTRAVENOUS

## 2020-12-11 MED ORDER — ACETAMINOPHEN 500 MG PO TABS
1000.0000 mg | ORAL_TABLET | ORAL | Status: AC
  Administered 2020-12-11: 1000 mg via ORAL

## 2020-12-11 MED ORDER — ONDANSETRON HCL 4 MG/2ML IJ SOLN
INTRAMUSCULAR | Status: AC
  Filled 2020-12-11: qty 2

## 2020-12-11 MED ORDER — ONDANSETRON HCL 4 MG/2ML IJ SOLN
INTRAMUSCULAR | Status: DC | PRN
  Administered 2020-12-11: 4 mg via INTRAVENOUS

## 2020-12-11 MED ORDER — EPHEDRINE 5 MG/ML INJ
INTRAVENOUS | Status: AC
  Filled 2020-12-11: qty 10

## 2020-12-11 MED ORDER — LACTATED RINGERS IV SOLN: INTRAVENOUS | Status: DC

## 2020-12-11 MED ORDER — HYDROCODONE-ACETAMINOPHEN 5-325 MG PO TABS: 1.0000 | ORAL_TABLET | Freq: Four times a day (QID) | ORAL | 0 refills | Status: DC | PRN

## 2020-12-11 MED ORDER — CEFAZOLIN SODIUM-DEXTROSE 2-4 GM/100ML-% IV SOLN
2.0000 g | INTRAVENOUS | Status: AC
  Administered 2020-12-11: 2 g via INTRAVENOUS

## 2020-12-11 MED ORDER — ACETAMINOPHEN 500 MG PO TABS
ORAL_TABLET | ORAL | Status: AC
  Filled 2020-12-11: qty 2

## 2020-12-11 MED ORDER — OXYCODONE HCL 5 MG/5ML PO SOLN: 5.0000 mg | Freq: Once | ORAL | Status: DC | PRN

## 2020-12-11 MED ORDER — BUPIVACAINE HCL (PF) 0.25 % IJ SOLN
INTRAMUSCULAR | Status: DC | PRN
  Administered 2020-12-11: 10 mL

## 2020-12-11 MED ORDER — LIDOCAINE HCL (CARDIAC) PF 100 MG/5ML IV SOSY
PREFILLED_SYRINGE | INTRAVENOUS | Status: DC | PRN
  Administered 2020-12-11: 60 mg via INTRAVENOUS

## 2020-12-11 MED ORDER — CHLORHEXIDINE GLUCONATE CLOTH 2 % EX PADS: 6.0000 | MEDICATED_PAD | Freq: Once | CUTANEOUS | Status: DC

## 2020-12-11 MED ORDER — PROPOFOL 10 MG/ML IV BOLUS
INTRAVENOUS | Status: DC | PRN
  Administered 2020-12-11: 120 mg via INTRAVENOUS

## 2020-12-11 MED ORDER — GABAPENTIN 300 MG PO CAPS: 300.0000 mg | ORAL_CAPSULE | ORAL | Status: DC

## 2020-12-11 MED ORDER — LIDOCAINE 2% (20 MG/ML) 5 ML SYRINGE
INTRAMUSCULAR | Status: AC
  Filled 2020-12-11: qty 5

## 2020-12-11 MED ORDER — AMISULPRIDE (ANTIEMETIC) 5 MG/2ML IV SOLN: 10.0000 mg | Freq: Once | INTRAVENOUS | Status: DC | PRN

## 2020-12-11 MED ORDER — BUPIVACAINE HCL (PF) 0.25 % IJ SOLN
INTRAMUSCULAR | Status: AC
  Filled 2020-12-11: qty 30

## 2020-12-11 MED ORDER — ONDANSETRON HCL 4 MG/2ML IJ SOLN: 4.0000 mg | Freq: Once | INTRAMUSCULAR | Status: DC | PRN

## 2020-12-11 MED ORDER — FENTANYL CITRATE (PF) 100 MCG/2ML IJ SOLN
INTRAMUSCULAR | Status: AC
  Filled 2020-12-11: qty 2

## 2020-12-11 MED ORDER — DEXAMETHASONE SODIUM PHOSPHATE 10 MG/ML IJ SOLN
INTRAMUSCULAR | Status: AC
  Filled 2020-12-11: qty 1

## 2020-12-11 MED ORDER — EPHEDRINE SULFATE 50 MG/ML IJ SOLN
INTRAMUSCULAR | Status: DC | PRN
  Administered 2020-12-11 (×3): 10 mg via INTRAVENOUS
  Administered 2020-12-11 (×4): 5 mg via INTRAVENOUS

## 2020-12-11 MED ORDER — DEXAMETHASONE SODIUM PHOSPHATE 10 MG/ML IJ SOLN
INTRAMUSCULAR | Status: DC | PRN
  Administered 2020-12-11: 5 mg via INTRAVENOUS

## 2020-12-11 MED ORDER — FENTANYL CITRATE (PF) 100 MCG/2ML IJ SOLN: 25.0000 ug | INTRAMUSCULAR | Status: DC | PRN

## 2020-12-11 SURGICAL SUPPLY — 45 items
APPLIER CLIP 9.375 MED OPEN (MISCELLANEOUS)
BLADE SURG 15 STRL LF DISP TIS (BLADE) ×1 IMPLANT
BLADE SURG 15 STRL SS (BLADE) ×1
CANISTER SUC SOCK COL 7IN (MISCELLANEOUS) IMPLANT
CANISTER SUCT 1200ML W/VALVE (MISCELLANEOUS) ×2 IMPLANT
CHLORAPREP W/TINT 26 (MISCELLANEOUS) ×2 IMPLANT
CLIP APPLIE 9.375 MED OPEN (MISCELLANEOUS) IMPLANT
COVER BACK TABLE 60X90IN (DRAPES) ×2 IMPLANT
COVER MAYO STAND STRL (DRAPES) ×2 IMPLANT
COVER PROBE W GEL 5X96 (DRAPES) ×2 IMPLANT
COVER WAND RF STERILE (DRAPES) IMPLANT
DECANTER SPIKE VIAL GLASS SM (MISCELLANEOUS) IMPLANT
DERMABOND ADVANCED (GAUZE/BANDAGES/DRESSINGS) ×1
DERMABOND ADVANCED .7 DNX12 (GAUZE/BANDAGES/DRESSINGS) ×1 IMPLANT
DRAPE LAPAROSCOPIC ABDOMINAL (DRAPES) ×2 IMPLANT
DRAPE UTILITY XL STRL (DRAPES) ×2 IMPLANT
ELECT COATED BLADE 2.86 ST (ELECTRODE) ×2 IMPLANT
ELECT REM PT RETURN 9FT ADLT (ELECTROSURGICAL) ×2
ELECTRODE REM PT RTRN 9FT ADLT (ELECTROSURGICAL) ×1 IMPLANT
GLOVE BIO SURGEON STRL SZ7.5 (GLOVE) IMPLANT
GLOVE BIOGEL PI IND STRL 7.0 (GLOVE) ×2 IMPLANT
GLOVE BIOGEL PI INDICATOR 7.0 (GLOVE) ×2
GLOVE SURG SS PI 7.5 STRL IVOR (GLOVE) ×2 IMPLANT
GLOVE SURG SYN 7.0 (GLOVE) ×2 IMPLANT
GOWN STRL REUS W/ TWL LRG LVL3 (GOWN DISPOSABLE) ×1 IMPLANT
GOWN STRL REUS W/ TWL XL LVL3 (GOWN DISPOSABLE) ×1 IMPLANT
GOWN STRL REUS W/TWL LRG LVL3 (GOWN DISPOSABLE) ×2
GOWN STRL REUS W/TWL XL LVL3 (GOWN DISPOSABLE) ×1
ILLUMINATOR WAVEGUIDE N/F (MISCELLANEOUS) IMPLANT
KIT MARKER MARGIN INK (KITS) ×2 IMPLANT
LIGHT WAVEGUIDE WIDE FLAT (MISCELLANEOUS) IMPLANT
NEEDLE HYPO 25X1 1.5 SAFETY (NEEDLE) ×2 IMPLANT
NS IRRIG 1000ML POUR BTL (IV SOLUTION) ×2 IMPLANT
PACK BASIN DAY SURGERY FS (CUSTOM PROCEDURE TRAY) ×2 IMPLANT
PENCIL SMOKE EVACUATOR (MISCELLANEOUS) ×2 IMPLANT
SLEEVE SCD COMPRESS KNEE MED (MISCELLANEOUS) ×2 IMPLANT
SPONGE LAP 18X18 RF (DISPOSABLE) ×2 IMPLANT
SUT MON AB 4-0 PC3 18 (SUTURE) ×2 IMPLANT
SUT SILK 2 0 SH (SUTURE) IMPLANT
SUT VICRYL 3-0 CR8 SH (SUTURE) ×2 IMPLANT
SYR CONTROL 10ML LL (SYRINGE) ×2 IMPLANT
TOWEL GREEN STERILE FF (TOWEL DISPOSABLE) ×2 IMPLANT
TRAY FAXITRON CT DISP (TRAY / TRAY PROCEDURE) ×2 IMPLANT
TUBE CONNECTING 20X1/4 (TUBING) ×2 IMPLANT
YANKAUER SUCT BULB TIP NO VENT (SUCTIONS) IMPLANT

## 2020-12-11 NOTE — Anesthesia Procedure Notes (Signed)
Procedure Name: LMA Insertion Date/Time: 12/11/2020 8:39 AM Performed by: Lauralyn Primes, CRNA Pre-anesthesia Checklist: Patient identified, Emergency Drugs available, Suction available and Patient being monitored Patient Re-evaluated:Patient Re-evaluated prior to induction Oxygen Delivery Method: Circle system utilized Preoxygenation: Pre-oxygenation with 100% oxygen Induction Type: IV induction Ventilation: Mask ventilation without difficulty LMA: LMA inserted LMA Size: 4.0 Number of attempts: 1 Airway Equipment and Method: Bite block Placement Confirmation: positive ETCO2 Tube secured with: Tape Dental Injury: Teeth and Oropharynx as per pre-operative assessment

## 2020-12-11 NOTE — Interval H&P Note (Signed)
History and Physical Interval Note:  12/11/2020 8:08 AM  [name]  has presented today for surgery, with the diagnosis of RIGHT BREAST DUCTAL CARCINOMA IN SITU.  The various methods of treatment have been discussed with the patient and family. After consideration of risks, benefits and other options for treatment, the patient has consented to  Procedure(s): RIGHT BREAST LUMPECTOMY WITH RADIOACTIVE SEED LOCALIZATION (Right) as a surgical intervention.  The patient's history has been reviewed, patient examined, no change in status, stable for surgery.  I have reviewed the patient's chart and labs.  Questions were answered to the patient's satisfaction.     [name]

## 2020-12-11 NOTE — H&P (Signed)
[name]  Location: [facility] Patient #: [id number] DOB: 1946/01/15 Married / Language: English / Race: White Female   History of Present Illness The patient is a 75 year old female who presents with breast cancer. We are asked to see the patient in consultation by Dr. Joycelyn Rua to evaluate her for a new right breast cancer. The patient is a 75 year old white female who recently went for a routine screening mammogram. At that time she was found to have a 1.9 cm area of calcification in the upper outer quadrant of the right breast very near her prepectoral implants. These were biopsied and came back as ductal carcinoma in situ. The tumor markers have not been reported yet. She does have a history of coronary artery bypass grafting about 8 years ago with subsequent stent placement. She is followed by Dr. [name] in cardiology. She does have a history of breast cancer in a paternal grandmother and a paternal great aunt. She does not smoke.   Past Surgical History  Breast Augmentation  Bilateral. Breast Biopsy  Right. Hysterectomy (not due to cancer) - Partial  Oral Surgery   Diagnostic Studies History Mammogram  within last year  Allergies  Penicillin G Sodium *PENICILLINS*  Sulfa 10 *OPHTHALMIC AGENTS*  Norvasc *CALCIUM CHANNEL BLOCKERS*  Lisinopril *CHEMICALS*  Lipitor *ANTIHYPERLIPIDEMICS*  Latex  Adhesive Tape 1/2"x10yd *MEDICAL DEVICES AND SUPPLIES*   Medication History  Aspirin (81MG  Tablet, Oral) Active. Atenolol (50MG  Tablet, Oral) Active. Furosemide (20MG  Tablet, Oral) Active. Rosuvastatin Calcium (20MG  Tablet, Oral) Active. Potassium (Oral) Specific strength unknown - Active. Multi-Vitamin (Oral) Active. Tylenol (500MG  Capsule, Oral) Active. Advil (200MG  Capsule, Oral) Active. Stress B Complex (Oral) Active. Medications Reconciled  Social History  Alcohol use  Occasional alcohol use. Caffeine use   Coffee. No drug use  Tobacco use  Never smoker.  Family History  Alcohol Abuse  Father. Heart Disease  Brother, Father, Mother, Son. Heart disease in female family member before age 33  Heart disease in female family member before age 57   Pregnancy / Birth History Age of menopause  75-51 Length (months) of breastfeeding  3-6 Maternal age  33-25 Para  2  Other Problems  Breast Cancer  Heart murmur  High blood pressure     Review of Systems  General Not Present- Appetite Loss, Chills, Fatigue, Fever, Night Sweats, Weight Gain and Weight Loss. Skin Present- Dryness. Not Present- Change in Wart/Mole, Hives, Jaundice, New Lesions, Non-Healing Wounds, Rash and Ulcer. HEENT Present- Seasonal Allergies and Wears glasses/contact lenses. Not Present- Earache, Hearing Loss, Hoarseness, Nose Bleed, Oral Ulcers, Ringing in the Ears, Sinus Pain, Sore Throat, Visual Disturbances and Yellow Eyes. Breast Not Present- Breast Mass, Breast Pain, Nipple Discharge and Skin Changes. Cardiovascular Not Present- Chest Pain, Difficulty Breathing Lying Down, Leg Cramps, Palpitations, Rapid Heart Rate, Shortness of Breath and Swelling of Extremities. Female Genitourinary Not Present- Frequency, Nocturia, Painful Urination, Pelvic Pain and Urgency. Neurological Present- Decreased Memory. Not Present- Fainting, Headaches, Numbness, Seizures, Tingling, Tremor, Trouble walking and Weakness. Psychiatric Not Present- Anxiety, Bipolar, Change in Sleep Pattern, Depression, Fearful and Frequent crying. Endocrine Not Present- Cold Intolerance, Excessive Hunger, Hair Changes, Heat Intolerance, Hot flashes and New Diabetes. Hematology Present- Blood Thinners. Not Present- Easy Bruising, Excessive bleeding, Gland problems, HIV and Persistent Infections.  Vitals  Weight: 177.25 lb Height: 66in Body Surface Area: 1.9 m Body Mass Index: 28.61 kg/m  Pulse: 71 (Regular)  BP: 122/74(Sitting, Left Arm,  Standard)  Physical Exam  General Mental Status-Alert. General Appearance-Consistent with stated age. Hydration-Well hydrated. Voice-Normal.  Head and Neck Head-normocephalic, atraumatic with no lesions or palpable masses. Trachea-midline. Thyroid Gland Characteristics - normal size and consistency.  Eye Eyeball - Bilateral-Extraocular movements intact. Sclera/Conjunctiva - Bilateral-No scleral icterus.  Chest and Lung Exam Chest and lung exam reveals -quiet, even and easy respiratory effort with no use of accessory muscles and on auscultation, normal breath sounds, no adventitious sounds and normal vocal resonance. Inspection Chest Wall - Normal. Back - normal.  Breast Note: There is no palpable mass in either breast. There is no palpable axillary, supraclavicular, or cervical lymphadenopathy.   Cardiovascular Cardiovascular examination reveals -normal heart sounds, regular rate and rhythm with no murmurs and normal pedal pulses bilaterally.  Abdomen Inspection Inspection of the abdomen reveals - No Hernias. Skin - Scar - no surgical scars. Palpation/Percussion Palpation and Percussion of the abdomen reveal - Soft, Non Tender, No Rebound tenderness, No Rigidity (guarding) and No hepatosplenomegaly. Auscultation Auscultation of the abdomen reveals - Bowel sounds normal.  Neurologic Neurologic evaluation reveals -alert and oriented x 3 with no impairment of recent or remote memory. Mental Status-Normal.  Musculoskeletal Normal Exam - Left-Upper Extremity Strength Normal and Lower Extremity Strength Normal. Normal Exam - Right-Upper Extremity Strength Normal and Lower Extremity Strength Normal.  Lymphatic Head & Neck  General Head & Neck Lymphatics: Bilateral - Description - Normal. Axillary  General Axillary Region: Bilateral - Description - Normal. Tenderness - Non Tender. Femoral & Inguinal  Generalized Femoral & Inguinal  Lymphatics: Bilateral - Description - Normal. Tenderness - Non Tender.    Assessment & Plan  DUCTAL CARCINOMA IN SITU (DCIS) OF RIGHT BREAST (D05.11) Impression: The patient appears to have a 1.9 cm area of ductal carcinoma in situ in the upper outer quadrant of the right breast. I have discussed with her in detail the different options for treatment and at this point she favors breast conservation which I feel is very reasonable. Given the diagnosis of ductal carcinoma in situ and its proximity to her and plan I would like to evaluate her with MRI to get a more accurate sense of the area involved. I will also refer her to medical and radiation oncology to discuss adjuvant therapy. We will contact her cardiologist for clearance given her history of coronary bypass. I have discussed with her in detail the risks and benefits of the operation as well as some of the technical aspects including the use of a radioactive seed for localization and she understands and wishes to proceed. Since this is not invasive cancer she will not likely need the capsule of the implant removed. This patient encounter took 60 minutes today to perform the following: take history, perform exam, review outside records, interpret imaging, counsel the patient on their diagnosis and document encounter, findings & plan in the EHR Current Plans Referred to Oncology, for evaluation and follow up (Oncology). Routine. MRI, breast, w/o contrast material; bilateral(77047)(ACR 0 )(DSN [id number])(G-Code G1004(MG)) (Clinical Scenarios: No content available for this procedure)

## 2020-12-11 NOTE — Anesthesia Postprocedure Evaluation (Signed)
Anesthesia Post Note  Patient: [name]  Procedure(s) Performed: RIGHT BREAST LUMPECTOMY WITH RADIOACTIVE SEED LOCALIZATION (Right Breast)     Patient location during evaluation: PACU Anesthesia Type: General Level of consciousness: awake and alert Pain management: pain level controlled Vital Signs Assessment: post-procedure vital signs reviewed and stable Respiratory status: spontaneous breathing, nonlabored ventilation and respiratory function stable Cardiovascular status: blood pressure returned to baseline and stable Postop Assessment: no apparent nausea or vomiting Anesthetic complications: no   No complications documented.  Last Vitals:  Vitals:   12/11/20 0955 12/11/20 1024  BP: (!) 149/65 (!) 153/69  Pulse: 63 62  Resp: 12 13  Temp:  (!) 36.3 C  SpO2: 97% 99%    Last Pain:  Vitals:   12/11/20 1004  TempSrc:   PainSc: 0-No pain                 Lucretia Kern

## 2020-12-11 NOTE — Discharge Instructions (Signed)
  Post Anesthesia Home Care Instructions  Activity: Get plenty of rest for the remainder of the day. A responsible individual must stay with you for 24 hours following the procedure.  For the next 24 hours, DO NOT: -Drive a car -Advertising copywriter -Drink alcoholic beverages -Take any medication unless instructed by your physician -Make any legal decisions or sign important papers.  Meals: Start with liquid foods such as gelatin or soup. Progress to regular foods as tolerated. Avoid greasy, spicy, heavy foods. If nausea and/or vomiting occur, drink only clear liquids until the nausea and/or vomiting subsides. Call your physician if vomiting continues.  Special Instructions/Symptoms: Your throat may feel dry or sore from the anesthesia or the breathing tube placed in your throat during surgery. If this causes discomfort, gargle with warm salt water. The discomfort should disappear within 24 hours.  If you had a scopolamine patch placed behind your ear for the management of post- operative nausea and/or vomiting:  1. The medication in the patch is effective for 72 hours, after which it should be removed.  Wrap patch in a tissue and discard in the trash. Wash hands thoroughly with soap and water. 2. You may remove the patch earlier than 72 hours if you experience unpleasant side effects which may include dry mouth, dizziness or visual disturbances. 3. Avoid touching the patch. Wash your hands with soap and water after contact with the patch.     May take Tylenol after 1:30pm, if needed.

## 2020-12-11 NOTE — Transfer of Care (Signed)
Immediate Anesthesia Transfer of Care Note  Patient: [name]  Procedure(s) Performed: RIGHT BREAST LUMPECTOMY WITH RADIOACTIVE SEED LOCALIZATION (Right Breast)  Patient Location: PACU  Anesthesia Type:General  Level of Consciousness: awake, alert  and oriented  Airway & Oxygen Therapy: Patient Spontanous Breathing and Patient connected to face mask oxygen  Post-op Assessment: Report given to RN and Post -op Vital signs reviewed and stable  Post vital signs: Reviewed and stable  Last Vitals:  Vitals Value Taken Time  BP 147/67 12/11/20 0923  Temp    Pulse 68 12/11/20 0926  Resp 16 12/11/20 0926  SpO2 100 % 12/11/20 0926  Vitals shown include unvalidated device data.  Last Pain:  Vitals:   12/11/20 0718  TempSrc: Oral  PainSc: 2       Patients Stated Pain Goal: 8 (12/11/20 6629)  Complications: No complications documented.

## 2020-12-11 NOTE — Anesthesia Preprocedure Evaluation (Signed)
Anesthesia Evaluation  Patient identified by MRN, date of birth, ID band Patient awake    Reviewed: Allergy & Precautions, NPO status , Patient's Chart, lab work & pertinent test results  History of Anesthesia Complications Negative for: history of anesthetic complications  Airway Mallampati: II  TM Distance: >3 FB Neck ROM: Full    Dental  (+) Teeth Intact   Pulmonary neg pulmonary ROS,    Pulmonary exam normal        Cardiovascular Exercise Tolerance: Good hypertension, + CAD, + Cardiac Stents (2013) and + CABG (2012)  Normal cardiovascular exam     Neuro/Psych negative neurological ROS     GI/Hepatic negative GI ROS, Neg liver ROS,   Endo/Other  negative endocrine ROS  Renal/GU negative Renal ROS  negative genitourinary   Musculoskeletal  (+) Arthritis ,   Abdominal   Peds  Hematology negative hematology ROS (+)   Anesthesia Other Findings  Breast cancer  Dr. Skains 10/2020: "She may proceed with mild cardiovascular risk based mainly upon her prior CAD.  She is able to complete greater than 4 METS of activity.  No further cardiac testing warranted.  If absolutely necessary can hold aspirin for 5 days prior."  Reproductive/Obstetrics                             Anesthesia Physical  Anesthesia Plan  ASA: III  Anesthesia Plan: General   Post-op Pain Management:    Induction: Intravenous  PONV Risk Score and Plan: 3 and Ondansetron, Dexamethasone, Midazolam and Treatment may vary due to age or medical condition  Airway Management Planned: LMA  Additional Equipment: None  Intra-op Plan:   Post-operative Plan: Extubation in OR  Informed Consent: I have reviewed the patients History and Physical, chart, labs and discussed the procedure including the risks, benefits and alternatives for the proposed anesthesia with the patient or authorized representative who has indicated  his/her understanding and acceptance.     Dental advisory given  Plan Discussed with:   Anesthesia Plan Comments:         Anesthesia Quick Evaluation  

## 2020-12-11 NOTE — Op Note (Signed)
12/11/2020  9:19 AM  PATIENT:  [name]  75 y.o. female  PRE-OPERATIVE DIAGNOSIS:  RIGHT BREAST DUCTAL CARCINOMA IN SITU  POST-OPERATIVE DIAGNOSIS:  RIGHT BREAST DUCTAL CARCINOMA IN SITU  PROCEDURE:  Procedure(s): RIGHT BREAST LUMPECTOMY WITH RADIOACTIVE SEED LOCALIZATION (Right)  SURGEON:  Surgeon(s) and Role:    * Griselda Miner, MD - Primary  PHYSICIAN ASSISTANT:   ASSISTANTS: none   ANESTHESIA:   local and general  EBL:  10 mL   BLOOD ADMINISTERED:none  DRAINS: none   LOCAL MEDICATIONS USED:  MARCAINE     SPECIMEN:  Source of Specimen:  right breast tissue with additional medial margin  DISPOSITION OF SPECIMEN:  PATHOLOGY  COUNTS:  YES  TOURNIQUET:  * No tourniquets in log *  DICTATION: .Dragon Dictation   After informed consent was obtained the patient was brought to the operating room and placed in the supine position on the operating table.  After adequate induction of general anesthesia the patient's right breast was prepped with ChloraPrep, allowed to dry, and draped in usual sterile manner.  An appropriate timeout was performed.  Previously an I-125 seed was placed in the upper outer quadrant of the right breast to mark an area of ductal carcinoma in situ.  The neoprobe was set to I-125 in the area of radioactivity was readily identified.  Because of the superficial location of the seed I elected to make an elliptical incision in the skin overlying the area of radioactivity with a 15 blade knife.  The incision was carried through the skin and subcutaneous tissue sharply with the electrocautery.  Dissection was carried widely around the seed while checking the area of radioactivity frequently.  This dissection was carried all the way to the capsule of the implant.  Once the specimen was removed it was oriented with the appropriate paint colors.  A specimen radiograph was obtained that showed the clip and seed to be within the specimen.  I did elect to remove  an additional medial margin which was marked appropriately and sent separately to pathology.  Hemostasis was achieved using the Bovie electrocautery.  The wound was irrigated with saline and infiltrated with more quarter percent Marcaine.  The deep layer of the wound was then closed with interrupted 3-0 Vicryl stitches.  The skin was closed with a running 4-0 Monocryl subcuticular stitch.  Dermabond dressings were applied.  The patient tolerated the procedure well.  At the end of the case all needle sponge and instrument counts were correct.  The patient was then awakened and taken to recovery in stable condition.  PLAN OF CARE: Discharge to home after PACU  PATIENT DISPOSITION:  PACU - hemodynamically stable.   Delay start of Pharmacological VTE agent (>24hrs) due to surgical blood loss or risk of bleeding: not applicable

## 2020-12-12 ENCOUNTER — Encounter (HOSPITAL_BASED_OUTPATIENT_CLINIC_OR_DEPARTMENT_OTHER): Payer: Self-pay | Admitting: General Surgery

## 2020-12-13 LAB — SURGICAL PATHOLOGY

## 2020-12-16 ENCOUNTER — Encounter: Payer: Self-pay | Admitting: *Deleted

## 2020-12-19 ENCOUNTER — Ambulatory Visit: Payer: Self-pay | Admitting: General Surgery

## 2020-12-20 ENCOUNTER — Encounter: Payer: Self-pay | Admitting: Radiation Oncology

## 2020-12-24 ENCOUNTER — Other Ambulatory Visit: Payer: Self-pay | Admitting: Cardiology

## 2020-12-25 ENCOUNTER — Other Ambulatory Visit: Payer: Medicare HMO

## 2020-12-26 ENCOUNTER — Encounter: Payer: Self-pay | Admitting: *Deleted

## 2020-12-27 ENCOUNTER — Ambulatory Visit
Admission: RE | Admit: 2020-12-27 | Discharge: 2020-12-27 | Disposition: A | Discharge status: Home / Self Care | Payer: Medicare HMO | Source: Ambulatory Visit | Attending: Neurology | Admitting: Neurology

## 2020-12-27 ENCOUNTER — Other Ambulatory Visit: Payer: Self-pay

## 2020-12-27 DIAGNOSIS — R2681 Unsteadiness on feet: Secondary | ICD-10-CM

## 2020-12-27 DIAGNOSIS — R292 Abnormal reflex: Secondary | ICD-10-CM | POA: Diagnosis not present

## 2020-12-27 DIAGNOSIS — Z87828 Personal history of other (healed) physical injury and trauma: Secondary | ICD-10-CM | POA: Diagnosis not present

## 2020-12-27 DIAGNOSIS — R42 Dizziness and giddiness: Secondary | ICD-10-CM | POA: Diagnosis not present

## 2020-12-27 DIAGNOSIS — M47812 Spondylosis without myelopathy or radiculopathy, cervical region: Secondary | ICD-10-CM | POA: Diagnosis not present

## 2020-12-27 MED ORDER — GADOBENATE DIMEGLUMINE 529 MG/ML IV SOLN
17.0000 mL | Freq: Once | INTRAVENOUS | Status: AC | PRN
  Administered 2020-12-27: 17 mL via INTRAVENOUS

## 2020-12-31 ENCOUNTER — Other Ambulatory Visit: Payer: Self-pay

## 2020-12-31 ENCOUNTER — Inpatient Hospital Stay: Payer: Medicare HMO | Attending: Hematology

## 2020-12-31 DIAGNOSIS — Z170 Estrogen receptor positive status [ER+]: Secondary | ICD-10-CM | POA: Insufficient documentation

## 2020-12-31 DIAGNOSIS — D0511 Intraductal carcinoma in situ of right breast: Secondary | ICD-10-CM | POA: Diagnosis not present

## 2020-12-31 DIAGNOSIS — D751 Secondary polycythemia: Secondary | ICD-10-CM

## 2020-12-31 LAB — CBC WITH DIFFERENTIAL/PLATELET
Abs Immature Granulocytes: 0.01 10*3/uL (ref 0.00–0.07)
Basophils Absolute: 0.1 10*3/uL (ref 0.0–0.1)
Basophils Relative: 1 %
Eosinophils Absolute: 0.1 10*3/uL (ref 0.0–0.5)
Eosinophils Relative: 2 %
HCT: 45.5 % (ref 36.0–46.0)
Hemoglobin: 14.9 g/dL (ref 12.0–15.0)
Immature Granulocytes: 0 %
Lymphocytes Relative: 29 %
Lymphs Abs: 2.1 10*3/uL (ref 0.7–4.0)
MCH: 31.4 pg (ref 26.0–34.0)
MCHC: 32.7 g/dL (ref 30.0–36.0)
MCV: 96 fL (ref 80.0–100.0)
Monocytes Absolute: 0.6 10*3/uL (ref 0.1–1.0)
Monocytes Relative: 8 %
Neutro Abs: 4.3 10*3/uL (ref 1.7–7.7)
Neutrophils Relative %: 60 %
Platelets: 196 10*3/uL (ref 150–400)
RBC: 4.74 MIL/uL (ref 3.87–5.11)
RDW: 12 % (ref 11.5–15.5)
WBC: 7.2 10*3/uL (ref 4.0–10.5)
nRBC: 0 % (ref 0.0–0.2)

## 2020-12-31 LAB — COMPREHENSIVE METABOLIC PANEL
ALT: 23 U/L (ref 0–44)
AST: 22 U/L (ref 15–41)
Albumin: 4 g/dL (ref 3.5–5.0)
Alkaline Phosphatase: 45 U/L (ref 38–126)
Anion gap: 9 (ref 5–15)
BUN: 12 mg/dL (ref 8–23)
CO2: 30 mmol/L (ref 22–32)
Calcium: 9.7 mg/dL (ref 8.9–10.3)
Chloride: 103 mmol/L (ref 98–111)
Creatinine, Ser: 0.88 mg/dL (ref 0.44–1.00)
GFR, Estimated: >60 mL/min (ref >60)
Glucose, Bld: 91 mg/dL (ref 70–99)
Potassium: 3.9 mmol/L (ref 3.5–5.1)
Sodium: 142 mmol/L (ref 135–145)
Total Bilirubin: 0.7 mg/dL (ref 0.3–1.2)
Total Protein: 7.6 g/dL (ref 6.5–8.1)

## 2020-12-31 NOTE — Progress Notes (Signed)

## 2020-12-31 NOTE — Progress Notes (Signed)
Tried calling pt in regards to her lab results. No answer. Unable to LVM

## 2021-01-03 ENCOUNTER — Telehealth: Payer: Self-pay

## 2021-01-03 DIAGNOSIS — R2681 Unsteadiness on feet: Secondary | ICD-10-CM

## 2021-01-03 DIAGNOSIS — R292 Abnormal reflex: Secondary | ICD-10-CM

## 2021-01-03 NOTE — Telephone Encounter (Signed)
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  This may explain her hyper reflexes.  I would like for her to be evaluated by neurosurgery.

## 2021-01-03 NOTE — Telephone Encounter (Signed)
Telephone call to pt, Advised pt if Mri results and the recommendation for referral to Neurosurgery.   Pt verbalized understanding.   Referral added

## 2021-01-06 ENCOUNTER — Encounter: Payer: Self-pay | Admitting: *Deleted

## 2021-01-07 ENCOUNTER — Ambulatory Visit: Payer: Medicare HMO

## 2021-01-07 ENCOUNTER — Ambulatory Visit: Payer: Medicare HMO | Admitting: Radiation Oncology

## 2021-01-08 ENCOUNTER — Other Ambulatory Visit: Payer: Self-pay

## 2021-01-08 ENCOUNTER — Encounter (HOSPITAL_BASED_OUTPATIENT_CLINIC_OR_DEPARTMENT_OTHER): Payer: Self-pay | Admitting: General Surgery

## 2021-01-09 ENCOUNTER — Ambulatory Visit: Payer: Medicare HMO | Admitting: Radiation Oncology

## 2021-01-11 ENCOUNTER — Other Ambulatory Visit (HOSPITAL_COMMUNITY)
Admission: RE | Admit: 2021-01-11 | Discharge: 2021-01-11 | Disposition: A | Discharge status: Home / Self Care | Payer: Medicare HMO | Source: Ambulatory Visit | Attending: General Surgery | Admitting: General Surgery

## 2021-01-11 DIAGNOSIS — Z20822 Contact with and (suspected) exposure to covid-19: Secondary | ICD-10-CM | POA: Insufficient documentation

## 2021-01-11 DIAGNOSIS — Z01812 Encounter for preprocedural laboratory examination: Secondary | ICD-10-CM | POA: Insufficient documentation

## 2021-01-12 LAB — SARS CORONAVIRUS 2 (TAT 6-24 HRS): SARS Coronavirus 2: NEGATIVE

## 2021-01-15 ENCOUNTER — Ambulatory Visit (HOSPITAL_BASED_OUTPATIENT_CLINIC_OR_DEPARTMENT_OTHER)
Admission: RE | Admit: 2021-01-15 | Discharge: 2021-01-15 | Disposition: A | Discharge status: Home / Self Care | Payer: Medicare HMO | Attending: General Surgery | Admitting: General Surgery

## 2021-01-15 ENCOUNTER — Ambulatory Visit (HOSPITAL_BASED_OUTPATIENT_CLINIC_OR_DEPARTMENT_OTHER): Payer: Medicare HMO | Admitting: Anesthesiology

## 2021-01-15 ENCOUNTER — Encounter (HOSPITAL_BASED_OUTPATIENT_CLINIC_OR_DEPARTMENT_OTHER)
Admission: RE | Disposition: A | Discharge status: Home / Self Care | Payer: Self-pay | Source: Home / Self Care | Attending: General Surgery

## 2021-01-15 ENCOUNTER — Other Ambulatory Visit: Payer: Self-pay

## 2021-01-15 ENCOUNTER — Encounter (HOSPITAL_BASED_OUTPATIENT_CLINIC_OR_DEPARTMENT_OTHER): Payer: Self-pay | Admitting: General Surgery

## 2021-01-15 DIAGNOSIS — Z888 Allergy status to other drugs, medicaments and biological substances status: Secondary | ICD-10-CM | POA: Insufficient documentation

## 2021-01-15 DIAGNOSIS — Z882 Allergy status to sulfonamides status: Secondary | ICD-10-CM | POA: Insufficient documentation

## 2021-01-15 DIAGNOSIS — Z91040 Latex allergy status: Secondary | ICD-10-CM | POA: Diagnosis not present

## 2021-01-15 DIAGNOSIS — I10 Essential (primary) hypertension: Secondary | ICD-10-CM | POA: Diagnosis not present

## 2021-01-15 DIAGNOSIS — D0511 Intraductal carcinoma in situ of right breast: Secondary | ICD-10-CM | POA: Insufficient documentation

## 2021-01-15 DIAGNOSIS — R7303 Prediabetes: Secondary | ICD-10-CM | POA: Diagnosis not present

## 2021-01-15 DIAGNOSIS — E782 Mixed hyperlipidemia: Secondary | ICD-10-CM | POA: Diagnosis not present

## 2021-01-15 DIAGNOSIS — Z880 Allergy status to penicillin: Secondary | ICD-10-CM | POA: Insufficient documentation

## 2021-01-15 DIAGNOSIS — N61 Mastitis without abscess: Secondary | ICD-10-CM | POA: Diagnosis not present

## 2021-01-15 HISTORY — PX: RE-EXCISION OF BREAST CANCER,SUPERIOR MARGINS: SHX6047

## 2021-01-15 SURGERY — RE-EXCISION OF BREAST CANCER,SUPERIOR MARGINS
Anesthesia: General | Site: Breast | Laterality: Right

## 2021-01-15 MED ORDER — DEXAMETHASONE SODIUM PHOSPHATE 10 MG/ML IJ SOLN
INTRAMUSCULAR | Status: DC | PRN
  Administered 2021-01-15: 10 mg via INTRAVENOUS

## 2021-01-15 MED ORDER — ACETAMINOPHEN 500 MG PO TABS
ORAL_TABLET | ORAL | Status: AC
  Filled 2021-01-15: qty 2

## 2021-01-15 MED ORDER — PROPOFOL 10 MG/ML IV BOLUS
INTRAVENOUS | Status: AC
  Filled 2021-01-15: qty 20

## 2021-01-15 MED ORDER — FENTANYL CITRATE (PF) 100 MCG/2ML IJ SOLN
INTRAMUSCULAR | Status: DC | PRN
  Administered 2021-01-15: 25 ug via INTRAVENOUS

## 2021-01-15 MED ORDER — LIDOCAINE 2% (20 MG/ML) 5 ML SYRINGE
INTRAMUSCULAR | Status: AC
  Filled 2021-01-15: qty 5

## 2021-01-15 MED ORDER — CEFAZOLIN SODIUM-DEXTROSE 2-4 GM/100ML-% IV SOLN
2.0000 g | INTRAVENOUS | Status: AC
  Administered 2021-01-15: 2 g via INTRAVENOUS

## 2021-01-15 MED ORDER — EPHEDRINE SULFATE-NACL 50-0.9 MG/10ML-% IV SOSY
PREFILLED_SYRINGE | INTRAVENOUS | Status: DC | PRN
  Administered 2021-01-15 (×3): 10 mg via INTRAVENOUS

## 2021-01-15 MED ORDER — ACETAMINOPHEN 325 MG PO TABS: 325.0000 mg | ORAL_TABLET | ORAL | Status: DC | PRN

## 2021-01-15 MED ORDER — ONDANSETRON HCL 4 MG/2ML IJ SOLN
INTRAMUSCULAR | Status: AC
  Filled 2021-01-15: qty 2

## 2021-01-15 MED ORDER — CHLORHEXIDINE GLUCONATE CLOTH 2 % EX PADS: 6.0000 | MEDICATED_PAD | Freq: Once | CUTANEOUS | Status: DC

## 2021-01-15 MED ORDER — EPHEDRINE 5 MG/ML INJ
INTRAVENOUS | Status: AC
  Filled 2021-01-15: qty 10

## 2021-01-15 MED ORDER — ONDANSETRON HCL 4 MG/2ML IJ SOLN
INTRAMUSCULAR | Status: DC | PRN
  Administered 2021-01-15: 4 mg via INTRAVENOUS

## 2021-01-15 MED ORDER — ACETAMINOPHEN 500 MG PO TABS
1000.0000 mg | ORAL_TABLET | ORAL | Status: AC
  Administered 2021-01-15: 1000 mg via ORAL

## 2021-01-15 MED ORDER — FENTANYL CITRATE (PF) 100 MCG/2ML IJ SOLN: 25.0000 ug | INTRAMUSCULAR | Status: DC | PRN

## 2021-01-15 MED ORDER — ONDANSETRON HCL 4 MG/2ML IJ SOLN: 4.0000 mg | Freq: Once | INTRAMUSCULAR | Status: DC | PRN

## 2021-01-15 MED ORDER — BUPIVACAINE-EPINEPHRINE 0.25% -1:200000 IJ SOLN
INTRAMUSCULAR | Status: DC | PRN
  Administered 2021-01-15: 14 mL

## 2021-01-15 MED ORDER — FENTANYL CITRATE (PF) 100 MCG/2ML IJ SOLN
INTRAMUSCULAR | Status: AC
  Filled 2021-01-15: qty 2

## 2021-01-15 MED ORDER — OXYCODONE HCL 5 MG/5ML PO SOLN: 5.0000 mg | Freq: Once | ORAL | Status: DC | PRN

## 2021-01-15 MED ORDER — OXYCODONE HCL 5 MG PO TABS: 5.0000 mg | ORAL_TABLET | Freq: Once | ORAL | Status: DC | PRN

## 2021-01-15 MED ORDER — PROPOFOL 10 MG/ML IV BOLUS
INTRAVENOUS | Status: DC | PRN
  Administered 2021-01-15: 140 mg via INTRAVENOUS

## 2021-01-15 MED ORDER — LACTATED RINGERS IV SOLN: INTRAVENOUS | Status: DC

## 2021-01-15 MED ORDER — HYDROCODONE-ACETAMINOPHEN 5-325 MG PO TABS: 1.0000 | ORAL_TABLET | Freq: Four times a day (QID) | ORAL | 0 refills | Status: DC | PRN

## 2021-01-15 MED ORDER — GABAPENTIN 300 MG PO CAPS
ORAL_CAPSULE | ORAL | Status: AC
  Filled 2021-01-15: qty 1

## 2021-01-15 MED ORDER — MEPERIDINE HCL 25 MG/ML IJ SOLN: 6.2500 mg | INTRAMUSCULAR | Status: DC | PRN

## 2021-01-15 MED ORDER — GABAPENTIN 300 MG PO CAPS
300.0000 mg | ORAL_CAPSULE | ORAL | Status: AC
  Administered 2021-01-15: 300 mg via ORAL

## 2021-01-15 MED ORDER — LIDOCAINE 2% (20 MG/ML) 5 ML SYRINGE
INTRAMUSCULAR | Status: DC | PRN
  Administered 2021-01-15: 80 mg via INTRAVENOUS

## 2021-01-15 MED ORDER — CEFAZOLIN SODIUM-DEXTROSE 2-4 GM/100ML-% IV SOLN
INTRAVENOUS | Status: AC
  Filled 2021-01-15: qty 100

## 2021-01-15 MED ORDER — ACETAMINOPHEN 160 MG/5ML PO SOLN: 325.0000 mg | ORAL | Status: DC | PRN

## 2021-01-15 SURGICAL SUPPLY — 42 items
ADH SKN CLS APL DERMABOND .7 (GAUZE/BANDAGES/DRESSINGS) ×1
APL PRP STRL LF DISP 70% ISPRP (MISCELLANEOUS) ×1
APPLIER CLIP 9.375 MED OPEN (MISCELLANEOUS) ×2
APR CLP MED 9.3 20 MLT OPN (MISCELLANEOUS) ×1
BLADE SURG 15 STRL LF DISP TIS (BLADE) ×1 IMPLANT
BLADE SURG 15 STRL SS (BLADE) ×2
CANISTER SUCT 1200ML W/VALVE (MISCELLANEOUS) IMPLANT
CHLORAPREP W/TINT 26 (MISCELLANEOUS) ×2 IMPLANT
CLIP APPLIE 9.375 MED OPEN (MISCELLANEOUS) ×1 IMPLANT
COVER BACK TABLE 60X90IN (DRAPES) ×2 IMPLANT
COVER MAYO STAND STRL (DRAPES) ×2 IMPLANT
COVER WAND RF STERILE (DRAPES) IMPLANT
DECANTER SPIKE VIAL GLASS SM (MISCELLANEOUS) IMPLANT
DERMABOND ADVANCED (GAUZE/BANDAGES/DRESSINGS) ×1
DERMABOND ADVANCED .7 DNX12 (GAUZE/BANDAGES/DRESSINGS) ×1 IMPLANT
DRAPE LAPAROSCOPIC ABDOMINAL (DRAPES) ×2 IMPLANT
DRAPE UTILITY XL STRL (DRAPES) ×2 IMPLANT
ELECT COATED BLADE 2.86 ST (ELECTRODE) ×2 IMPLANT
ELECT REM PT RETURN 9FT ADLT (ELECTROSURGICAL) ×2
ELECTRODE REM PT RTRN 9FT ADLT (ELECTROSURGICAL) ×1 IMPLANT
GLOVE SURG ENC MOIS LTX SZ7.5 (GLOVE) IMPLANT
GLOVE SURG SS PI 7.5 STRL IVOR (GLOVE) ×4 IMPLANT
GOWN STRL REUS W/ TWL LRG LVL3 (GOWN DISPOSABLE) ×2 IMPLANT
GOWN STRL REUS W/TWL LRG LVL3 (GOWN DISPOSABLE) ×4
ILLUMINATOR WAVEGUIDE N/F (MISCELLANEOUS) IMPLANT
KIT MARKER MARGIN INK (KITS) IMPLANT
LIGHT WAVEGUIDE WIDE FLAT (MISCELLANEOUS) IMPLANT
NEEDLE HYPO 25X1 1.5 SAFETY (NEEDLE) ×2 IMPLANT
NS IRRIG 1000ML POUR BTL (IV SOLUTION) ×2 IMPLANT
PACK BASIN DAY SURGERY FS (CUSTOM PROCEDURE TRAY) ×2 IMPLANT
PENCIL SMOKE EVACUATOR (MISCELLANEOUS) ×2 IMPLANT
SLEEVE SCD COMPRESS KNEE MED (MISCELLANEOUS) ×2 IMPLANT
SPONGE LAP 18X18 RF (DISPOSABLE) ×2 IMPLANT
STAPLER VISISTAT 35W (STAPLE) IMPLANT
SUT MON AB 4-0 PC3 18 (SUTURE) ×2 IMPLANT
SUT SILK 2 0 SH (SUTURE) ×2 IMPLANT
SUT VICRYL 3-0 CR8 SH (SUTURE) ×2 IMPLANT
SYR CONTROL 10ML LL (SYRINGE) ×2 IMPLANT
TOWEL GREEN STERILE FF (TOWEL DISPOSABLE) ×2 IMPLANT
TRAY FAXITRON CT DISP (TRAY / TRAY PROCEDURE) IMPLANT
TUBE CONNECTING 20X1/4 (TUBING) IMPLANT
YANKAUER SUCT BULB TIP NO VENT (SUCTIONS) IMPLANT

## 2021-01-15 NOTE — Anesthesia Postprocedure Evaluation (Signed)
Anesthesia Post Note  Patient: [name]  Procedure(s) Performed: RE-EXCISION RIGHT BREAST MEDIAL MARGIN (Right Breast)     Patient location during evaluation: PACU Anesthesia Type: General Level of consciousness: awake and alert Pain management: pain level controlled Vital Signs Assessment: post-procedure vital signs reviewed and stable Respiratory status: spontaneous breathing, nonlabored ventilation, respiratory function stable and patient connected to nasal cannula oxygen Cardiovascular status: blood pressure returned to baseline and stable Postop Assessment: no apparent nausea or vomiting Anesthetic complications: no   No complications documented.  Last Vitals:  Vitals:   01/15/21 1630 01/15/21 1653  BP: 130/64 136/60  Pulse: 67 65  Resp: 13 16  Temp:  (!) 36.4 C  SpO2: 100% 100%    Last Pain:  Vitals:   01/15/21 1653  TempSrc:   PainSc: 0-No pain                 Sakai Heinle

## 2021-01-15 NOTE — Discharge Instructions (Signed)
No Tylenol until 9:00pm if needed.  Post Anesthesia Home Care Instructions  Activity: Get plenty of rest for the remainder of the day. A responsible individual must stay with you for 24 hours following the procedure.  For the next 24 hours, DO NOT: -Drive a car -Operate machinery -Drink alcoholic beverages -Take any medication unless instructed by your physician -Make any legal decisions or sign important papers.  Meals: Start with liquid foods such as gelatin or soup. Progress to regular foods as tolerated. Avoid greasy, spicy, heavy foods. If nausea and/or vomiting occur, drink only clear liquids until the nausea and/or vomiting subsides. Call your physician if vomiting continues.  Special Instructions/Symptoms: Your throat may feel dry or sore from the anesthesia or the breathing tube placed in your throat during surgery. If this causes discomfort, gargle with warm salt water. The discomfort should disappear within 24 hours.  If you had a scopolamine patch placed behind your ear for the management of post- operative nausea and/or vomiting:  1. The medication in the patch is effective for 72 hours, after which it should be removed.  Wrap patch in a tissue and discard in the trash. Wash hands thoroughly with soap and water. 2. You may remove the patch earlier than 72 hours if you experience unpleasant side effects which may include dry mouth, dizziness or visual disturbances. 3. Avoid touching the patch. Wash your hands with soap and water after contact with the patch.     

## 2021-01-15 NOTE — H&P (Signed)
  [name]  Location: [facility] Patient #: [id number] DOB: 1946/09/11 Married / Language: English / Race: White Female   History of Present Illness The patient is a 75 year old female who presents for a follow-up for Breast cancer. The patient is a 75 year old white female who is about 2 weeks status post right breast lumpectomy for ductal carcinoma in situ that was essentially ER and PR negative. She tolerated the surgery well. She denies any significant breast pain. Her pathology showed a close posterior margin but this was on the capsule of the implant. She also had a close medial margin on an additional excised margin but this seems focal.   Allergies  Penicillin G Sodium *PENICILLINS*  Sulfa 10 *OPHTHALMIC AGENTS*  Norvasc *CALCIUM CHANNEL BLOCKERS*  Lisinopril *CHEMICALS*  Lipitor *ANTIHYPERLIPIDEMICS*  Latex  Adhesive Tape 1/2"x10yd *MEDICAL DEVICES AND SUPPLIES*  Allergies Reconciled   Medication History  Aspirin (81MG  Tablet, Oral) Active. Atenolol (50MG  Tablet, Oral) Active. Furosemide (20MG  Tablet, Oral) Active. Rosuvastatin Calcium (20MG  Tablet, Oral) Active. Potassium (Oral) Specific strength unknown - Active. Multi-Vitamin (Oral) Active. Tylenol (500MG  Capsule, Oral) Active. Advil (200MG  Capsule, Oral) Active. Stress B Complex (Oral) Active. Medications Reconciled    Physical Exam  Breast Note: The right breast upper outer quadrant incision is healing nicely with no sign of infection or seroma.     Assessment & Plan  DUCTAL CARCINOMA IN SITU (DCIS) OF RIGHT BREAST (D05.11) Impression: The patient is about 2 weeks status post right breast lumpectomy for ductal carcinoma in situ. It was close at the deep margin but the deep margin was the capsule of the implant. It was also focally close at the medial margin but I do not feel like this needs to be reexcised at this point. She will meet with medical and radiation  oncology to discuss adjuvant therapy. If they are in agreement and I will plan to see her back in about 6 months. Current Plans Follow up with [place] in the office in 6 MONTHS.   Call us sooner as needed.

## 2021-01-15 NOTE — Anesthesia Procedure Notes (Signed)
Procedure Name: LMA Insertion Date/Time: 01/15/2021 3:36 PM Performed by: Genelle Bal, CRNA Pre-anesthesia Checklist: Patient identified, Emergency Drugs available, Suction available and Patient being monitored Patient Re-evaluated:Patient Re-evaluated prior to induction Oxygen Delivery Method: Circle system utilized Preoxygenation: Pre-oxygenation with 100% oxygen Induction Type: IV induction Ventilation: Mask ventilation without difficulty LMA: LMA inserted LMA Size: 4.0 Number of attempts: 1 Airway Equipment and Method: Bite block Placement Confirmation: positive ETCO2 Tube secured with: Tape Dental Injury: Teeth and Oropharynx as per pre-operative assessment

## 2021-01-15 NOTE — Anesthesia Preprocedure Evaluation (Signed)
Anesthesia Evaluation  Patient identified by MRN, date of birth, ID band Patient awake    Reviewed: Allergy & Precautions, NPO status , Patient's Chart, lab work & pertinent test results  History of Anesthesia Complications Negative for: history of anesthetic complications  Airway Mallampati: II  TM Distance: >3 FB Neck ROM: Full    Dental  (+) Teeth Intact   Pulmonary neg pulmonary ROS,    Pulmonary exam normal        Cardiovascular Exercise Tolerance: Good hypertension, + CAD, + Cardiac Stents (2013) and + CABG (2012)  Normal cardiovascular exam     Neuro/Psych negative neurological ROS     GI/Hepatic negative GI ROS, Neg liver ROS,   Endo/Other  negative endocrine ROS  Renal/GU negative Renal ROS  negative genitourinary   Musculoskeletal  (+) Arthritis ,   Abdominal   Peds  Hematology negative hematology ROS (+)   Anesthesia Other Findings  Breast cancer  Dr. Marlou Porch 10/2020: "She may proceed with mild cardiovascular risk based mainly upon her prior CAD.  She is able to complete greater than 4 METS of activity.  No further cardiac testing warranted.  If absolutely necessary can hold aspirin for 5 days prior."  Reproductive/Obstetrics                             Anesthesia Physical  Anesthesia Plan  ASA: III  Anesthesia Plan: General   Post-op Pain Management:    Induction: Intravenous  PONV Risk Score and Plan: 3 and Ondansetron, Dexamethasone, Midazolam and Treatment may vary due to age or medical condition  Airway Management Planned: LMA  Additional Equipment: None  Intra-op Plan:   Post-operative Plan: Extubation in OR  Informed Consent: I have reviewed the patients History and Physical, chart, labs and discussed the procedure including the risks, benefits and alternatives for the proposed anesthesia with the patient or authorized representative who has indicated  his/her understanding and acceptance.     Dental advisory given  Plan Discussed with:   Anesthesia Plan Comments:         Anesthesia Quick Evaluation

## 2021-01-15 NOTE — Op Note (Signed)
01/15/2021  4:07 PM  PATIENT:  [name]  75 y.o. female  PRE-OPERATIVE DIAGNOSIS:  RIGHT BREAST DCIS  POST-OPERATIVE DIAGNOSIS:  RIGHT BREAST DCIS  PROCEDURE:  Procedure(s): RE-EXCISION RIGHT BREAST MEDIAL MARGIN AND POSTERIOR MARGIN  SURGEON:  Surgeon(s) and Role:    * Jovita Kussmaul, MD - Primary  PHYSICIAN ASSISTANT:   ASSISTANTS: none   ANESTHESIA:   local and general  EBL:  5 mL   BLOOD ADMINISTERED:none  DRAINS: none   LOCAL MEDICATIONS USED:  MARCAINE     SPECIMEN:  Source of Specimen:  right breast medial and posterior margin  DISPOSITION OF SPECIMEN:  PATHOLOGY  COUNTS:  YES  TOURNIQUET:  * No tourniquets in log *  DICTATION: .Dragon Dictation   After informed consent was obtained the patient was brought to the operating room and placed in the supine position on the operating table.  After adequate induction of general anesthesia the patient's right breast was prepped with ChloraPrep, allowed to dry, and draped in usual sterile manner.  Her previous incision in the upper outer quadrant was infiltrated around it with quarter percent Marcaine.  The incision was then reopened sharply with a 15 blade knife.  Blunt hemostat dissection was carried out until we reentered the lumpectomy cavity.  The medial margin was then reexcised sharply with the electrocautery and marked with a stitch on the new true surgical margin.  This was sent to pathology for further evaluation.  I also shaved some of the very thin tissue on the capsule of the implant posteriorly and this was also marked with a stitch on the new true surgical margin and sent to pathology for further evaluation.  Hemostasis was achieved using the Bovie electrocautery.  The wound was irrigated with saline and infiltrated with more quarter percent Marcaine.  The deep layer of the wound was closed with interrupted 3-0 Vicryl stitches.  The skin was then closed with a running 4-0 Monocryl subcuticular stitch.   Dermabond dressings were applied.  The patient tolerated the procedure well.  At the end of the case all needle sponge and instrument counts were correct.  The patient was then awakened and taken to recovery in stable condition.  PLAN OF CARE: Discharge to home after PACU  PATIENT DISPOSITION:  PACU - hemodynamically stable.   Delay start of Pharmacological VTE agent (>24hrs) due to surgical blood loss or risk of bleeding: not applicable

## 2021-01-15 NOTE — Interval H&P Note (Signed)
History and Physical Interval Note:  01/15/2021 3:08 PM  [name]  has presented today for surgery, with the diagnosis of RIGHT BREAST DCIS.  The various methods of treatment have been discussed with the patient and family. After consideration of risks, benefits and other options for treatment, the patient has consented to  Procedure(s): RE-EXCISION RIGHT BREAST MEDIAL MARGIN (Right) as a surgical intervention.  The patient's history has been reviewed, patient examined, no change in status, stable for surgery.  I have reviewed the patient's chart and labs.  Questions were answered to the patient's satisfaction.     [name]

## 2021-01-15 NOTE — Transfer of Care (Signed)
Immediate Anesthesia Transfer of Care Note  Patient: [name]  Procedure(s) Performed: RE-EXCISION RIGHT BREAST MEDIAL MARGIN (Right Breast)  Patient Location: PACU  Anesthesia Type:General  Level of Consciousness: drowsy and patient cooperative  Airway & Oxygen Therapy: Patient Spontanous Breathing and Patient connected to face mask oxygen  Post-op Assessment: Report given to RN and Post -op Vital signs reviewed and stable  Post vital signs: Reviewed and stable  Last Vitals:  Vitals Value Taken Time  BP 121/52 01/15/21 1612  Temp    Pulse 51 01/15/21 1615  Resp 14 01/15/21 1615  SpO2 100 % 01/15/21 1615  Vitals shown include unvalidated device data.  Last Pain:  Vitals:   01/15/21 1421  TempSrc: Oral  PainSc: 0-No pain         Complications: No complications documented.

## 2021-01-16 ENCOUNTER — Encounter (HOSPITAL_BASED_OUTPATIENT_CLINIC_OR_DEPARTMENT_OTHER): Payer: Self-pay | Admitting: General Surgery

## 2021-01-20 ENCOUNTER — Encounter: Payer: Self-pay | Admitting: *Deleted

## 2021-01-20 LAB — SURGICAL PATHOLOGY

## 2021-01-22 DIAGNOSIS — H00022 Hordeolum internum right lower eyelid: Secondary | ICD-10-CM | POA: Diagnosis not present

## 2021-01-31 ENCOUNTER — Encounter: Payer: Self-pay | Admitting: *Deleted

## 2021-02-02 NOTE — Progress Notes (Signed)
[facility]  [phone] [phone]     ID: [name] DOB: 1946-01-02  MR#: [id number]  RCV#:[id number]  Patient Care Team: Orpah Melter, MD as PCP - General (Family Medicine) Jerline Pain, MD as PCP - Cardiology (Cardiology) Kyung Rudd, MD as Consulting Physician (Radiation Oncology) Jovita Kussmaul, MD as Consulting Physician (General Surgery) Osha Errico, Virgie Dad, MD as Consulting Physician (Oncology) Mauro Kaufmann, RN as Oncology Nurse Navigator Rockwell Germany, RN as Oncology Nurse Navigator Chauncey Cruel, MD OTHER MD:  CHIEF COMPLAINT: Ductal carcinoma in situ  CURRENT TREATMENT: Considering additional surgery   INTERVAL HISTORY: [name] returns today for follow up of her noninvasive breast cancer. She was evaluated in the multidisciplinary breast cancer clinic on 11/14/2020.   Since consultation, she underwent genetic counseling on 11/18/2020. Results were negative.  She underwent right lumpectomy on 12/11/2020 under Dr. Marlou Starks. Pathology from the procedure ([id number]) showed: high grade ductal carcinoma in situ; carcinoma <1 mm from posterior margin.  She underwent re-excision of the posterior margin on 01/15/2021. Pathology from the procedure ([id number]) showed residual DCIS.   REVIEW OF SYSTEMS: [name] did well with her reexcision.  She never had significant pain even with the original surgery or the reexcision.  She is much more worried about the whole neurology issue.  She had an MRI of the neck which was not revealing.  She has been referred to neurosurgery.  A detailed review of systems was otherwise stable   COVID 19 VACCINATION STATUS: Pfizer x2 plus booster November 2021   HISTORY OF CURRENT ILLNESS: From the original intake note:  [name] had been under follow up for probable benign left breast asymmetry and corresponding mass. She underwent two year follow up bilateral diagnostic  mammography with tomography and left breast ultrasonography at [facility] on 10/11/2020 showing: breast density category C; 1.9 cm indeterminate calcifications in far posterior aspect of upper-outer right breast; stable hypoechoic mass in left breast at 9 o'clock.  Accordingly on 10/17/2020 she proceeded to biopsy of the right breast area in question. The pathology from this procedure ([id number]) showed: ductal carcinoma in situ, high grade, with calcifications. Prognostic indicators significant for: estrogen receptor, 5% positive with moderate-weak staining intensity and progesterone receptor, 0% negative.   Breast MRI performed on 11/04/2020 showed: breast composition B; 5 cm post-biopsy hematoma within upper-outer right breast at site of biopsy-proven DCIS; minima;l adjacent streaky enhancement along this hematoma is noted; no other suspicious abnormalities noted within either breast or lymph nodes; bilateral retroglandular implants.  The patient's subsequent history is as detailed below.   PAST MEDICAL HISTORY: Past Medical History:  Diagnosis Date  . Allergic rhinitis   . Breast cancer ([place]) 10/17/2020  . CAD (coronary artery disease)    , s/p CAGB 01/2011- LIMA to LAD patent, all other vein grafts occluded. Circumflex DES 3/13 placed following lateral ischemia on nuclear stress test  . Cervical spondylosis    , bulging  discs  . Disc herniation    L4-L5, L5-S1  . Family history of breast cancer 11/18/2020  . Hypertension   . Mixed hyperlipidemia   . Obesity   . Post concussion syndrome    processes information slowly,   . Postmenopausal   . Prediabetes     PAST SURGICAL HISTORY: Past Surgical History:  Procedure Laterality Date  . Bone Spur Left    Foot  . BREAST BIOPSY    . BREAST ENHANCEMENT SURGERY    .  BREAST IMPLANT EXCHANGE     replaced rupture implant  . BREAST LUMPECTOMY WITH RADIOACTIVE SEED LOCALIZATION Right 12/11/2020   Procedure: RIGHT BREAST LUMPECTOMY  WITH RADIOACTIVE SEED LOCALIZATION;  Surgeon: Jovita Kussmaul, MD;  Location: [facility];  Service: General;  Laterality: Right;  . COLONOSCOPY    . CORONARY ARTERY BYPASS GRAFT    . Left Bunionectomy    . LEFT HEART CATHETERIZATION WITH CORONARY/GRAFT ANGIOGRAM  02/05/2012   Procedure: LEFT HEART CATHETERIZATION WITH Beatrix Fetters;  Surgeon: Candee Furbish, MD;  Location: [facility] CATH LAB;  Service: Cardiovascular;;  . PARTIAL HYSTERECTOMY    . RE-EXCISION OF BREAST CANCER,SUPERIOR MARGINS Right 01/15/2021   Procedure: RE-EXCISION RIGHT BREAST MEDIAL MARGIN;  Surgeon: Jovita Kussmaul, MD;  Location: [facility];  Service: General;  Laterality: Right;  . REPLACEMENT TOTAL KNEE Right   . TONSILLECTOMY    . TUBAL LIGATION Bilateral     FAMILY HISTORY: Family History  Problem Relation Age of Onset  . Heart disease Mother   . Hypertension Mother   . Heart attack Father   . Heart disease Brother   . Heart disease Brother   . Breast cancer Paternal Grandmother        dx unknown age  . Breast cancer Paternal Aunt        dx unknown age  The patient's father died at age 17 from heart disease.  The patient's mother died from old age at 87.  Patient had 2 brothers, no sisters.  The paternal grandmother and a paternal aunt both had breast cancer.  There is no history of prostate or ovarian cancer in the family   GYNECOLOGIC HISTORY:  No LMP recorded. Patient has had a hysterectomy. Menarche: 75 years old Age at first live birth: 75 years old [place] 3 (the first 1 stillborn) LMP hysterectomy 1999 HRT no Hysterectomy? 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  At home is just the 2 of them plus there poodle not.  Son Nicki Reaper lives in [place] where he is an Chief Financial Officer.  Son Randall Hiss lives in [place] where he also is an Chief Financial Officer.  The patient  has 6 grandchildren.  She attends [employer]    ADVANCED DIRECTIVES: In the absence of any documents to the contrary the patient's husband is her healthcare power of attorney   HEALTH MAINTENANCE: Social History   Tobacco Use  . Smoking status: Never Smoker  . Smokeless tobacco: Never Used  Vaping Use  . Vaping Use: Never used  Substance Use Topics  . Alcohol use: Yes    Comment: Occasionally  . Drug use: No     Colonoscopy:   PAP: Status post  Bone density: Remote   Allergies  Allergen Reactions  . Norvasc [Amlodipine Besylate] Other (See Comments)    Edema   . Latex Itching and Rash  . Lipitor [Atorvastatin] Other (See Comments)    Muscle aches  . Lisinopril Swelling and Cough  . Penicillins Rash  . Sulfa Antibiotics Rash  . Tape Itching and Rash  . Tetanus Toxoids Other (See Comments)    Patient said "Didn't do well".    Current Outpatient Medications  Medication Sig Dispense Refill  . acetaminophen (TYLENOL) 650 MG CR tablet 1 tablet as needed    . aspirin 81 MG tablet Take 81 mg by mouth daily.    Marland Kitchen  atenolol (TENORMIN) 50 MG tablet TAKE 1 TABLET BY MOUTH EVERY DAY 90 tablet 3  . B Complex Vitamins (VITAMIN B COMPLEX PO) See admin instructions.    . furosemide (LASIX) 20 MG tablet TAKE 1 TABLET BY MOUTH EVERY DAY AS NEEDED 90 tablet 3  . HYDROcodone-acetaminophen (NORCO/VICODIN) 5-325 MG tablet Take 1-2 tablets by mouth every 6 (six) hours as needed for moderate pain or severe pain. 10 tablet 0  . HYDROcodone-acetaminophen (NORCO/VICODIN) 5-325 MG tablet Take 1-2 tablets by mouth every 6 (six) hours as needed for moderate pain or severe pain. 10 tablet 0  . ibuprofen (ADVIL) 200 MG tablet 1 tablet with food or milk as needed    . Potassium Gluconate 550 (90 K) MG TABS Take 550 mg by mouth daily.    . rosuvastatin (CRESTOR) 20 MG tablet TAKE 1 TABLET BY MOUTH EVERY DAY 90 tablet 3   No current facility-administered medications for this visit.     OBJECTIVE: White woman who walks with a cane  Vitals:   02/03/21 1259  BP: 137/83  Pulse: 61  Resp: 18  Temp: 98.1 F (36.7 C)  SpO2: 100%     Body mass index is 28.05 kg/m.   Wt Readings from Last 3 Encounters:  02/03/21 173 lb 12.8 oz (78.8 kg)  01/15/21 173 lb 11.6 oz (78.8 kg)  12/11/20 180 lb 8.9 oz (81.9 kg)      ECOG FS:1 - Symptomatic but completely ambulatory  Sclerae unicteric, EOMs intact Wearing a mask No cervical or supraclavicular adenopathy Lungs no rales or rhonchi Heart regular rate and rhythm Abd soft, nontender, positive bowel sounds MSK no focal spinal tenderness, no upper extremity lymphedema Neuro: nonfocal, well oriented, appropriate affect, gait imbalance and slow speech Breasts: The right breast is status post repeat lumpectomy.  The cosmetic result is excellent.  The left breast is benign as are both axillae.    LAB RESULTS:  CMP     Component Value Date/Time   NA 141 02/03/2021 1221   K 3.6 02/03/2021 1221   CL 104 02/03/2021 1221   CO2 29 02/03/2021 1221   GLUCOSE 107 (H) 02/03/2021 1221   BUN 12 02/03/2021 1221   CREATININE 0.83 02/03/2021 1221   CREATININE 0.90 11/14/2020 1526   CREATININE 0.77 02/25/2016 1507   CALCIUM 9.7 02/03/2021 1221   PROT 8.0 02/03/2021 1221   ALBUMIN 4.4 02/03/2021 1221   AST 26 02/03/2021 1221   AST 26 11/14/2020 1526   ALT 27 02/03/2021 1221   ALT 28 11/14/2020 1526   ALKPHOS 45 02/03/2021 1221   BILITOT 0.7 02/03/2021 1221   BILITOT 0.7 11/14/2020 1526   GFRNONAA >60 02/03/2021 1221   GFRNONAA >60 11/14/2020 1526   GFRAA >90 02/06/2012 0500    No results found for: TOTALPROTELP, ALBUMINELP, A1GS, A2GS, BETS, BETA2SER, GAMS, MSPIKE, SPEI  Lab Results  Component Value Date   WBC 7.3 02/03/2021   NEUTROABS 4.6 02/03/2021   HGB 15.7 (H) 02/03/2021   HCT 47.4 (H) 02/03/2021   MCV 96.0 02/03/2021   PLT 195 02/03/2021    No results found for: LABCA2  No components found for:  VQMGQQ761  No results for input(s): INR in the last 168 hours.  No results found for: LABCA2  No results found for: PJK932  No results found for: IZT245  No results found for: YKD983  No results found for: CA2729  No components found for: HGQUANT  No results found for: CEA1 / No results found  for: CEA1   No results found for: AFPTUMOR  No results found for: CHROMOGRNA  No results found for: KPAFRELGTCHN, LAMBDASER, KAPLAMBRATIO (kappa/lambda light chains)  No results found for: HGBA, HGBA2QUANT, HGBFQUANT, HGBSQUAN (Hemoglobinopathy evaluation)   No results found for: LDH  No results found for: IRON, TIBC, IRONPCTSAT (Iron and TIBC)  No results found for: FERRITIN  Urinalysis    Component Value Date/Time   COLORURINE YELLOW 01/23/2011 1427   APPEARANCEUR CLEAR 01/23/2011 1427   LABSPEC 1.022 01/23/2011 1427   PHURINE 5.5 01/23/2011 1427   GLUCOSEU NEGATIVE 02/15/2008 1318   HGBUR NEGATIVE 01/23/2011 1427   BILIRUBINUR NEGATIVE 01/23/2011 1427   KETONESUR NEGATIVE 01/23/2011 1427   PROTEINUR NEGATIVE 01/23/2011 1427   UROBILINOGEN 0.2 01/23/2011 1427   NITRITE NEGATIVE 01/23/2011 1427   LEUKOCYTESUR  01/23/2011 1427    NEGATIVE MICROSCOPIC NOT DONE ON URINES WITH NEGATIVE PROTEIN, BLOOD, LEUKOCYTES, NITRITE, OR GLUCOSE <1000 mg/dL.    STUDIES: No results found.   ELIGIBLE FOR AVAILABLE RESEARCH PROTOCOL: no  ASSESSMENT: 75 y.o. [name] woman status post right breast biopsy 10/17/2020 for ductal carcinoma in situ grade 3, weakly estrogen receptor positive at 5%, progesterone receptor negative.  (1) status post right lumpectomy 12/11/2020 showed ductal carcinoma in situ, high-grade, with a close posterior margin  (a) additional surgery 01/15/2021 continues to show involvement of the medial margin  (2) adjuvant radiation to follow as appropriate  (3) can consider antiestrogens for prophylaxis, not treatment  (4) erythrocytosis: Patient donates blood  regularly  (5) genetics testing 11/30/2020 through the [facility] Common Hereditary Cancers Panel found no deleterious mutations in APC, ATM, AXIN2, BARD1, BMPR1A, BRCA1, BRCA2, BRIP1, CDH1, CDK4, CDKN2A (p14ARF), CDKN2A (p16INK4a), CHEK2, CTNNA1, DICER1, EPCAM (Deletion/duplication testing only), GREM1 (promoter region deletion/duplication testing only), KIT, MEN1, MLH1, MSH2, MSH3, MSH6, MUTYH, NBN, NF1, NHTL1, PALB2, PDGFRA, PMS2, POLD1, POLE, PTEN, RAD50, RAD51C, RAD51D, RNF43, SDHB, SDHC, SDHD, SMAD4, SMARCA4. STK11, TP53, TSC1, TSC2, and VHL.  The following genes were evaluated for sequence changes only: SDHA and HOXB13 c.251G>A variant only.   PLAN: [name] understands that despite 2 resections she still has some disease left in the breast.  She has extensive ductal carcinoma in situ.  I reassured her that we have not found any invasive disease.  She has 3 options at this point.  She can have a third lumpectomy and try to obtain margins.  That might succeed.  She can have a mastectomy and that generally would take care of the issue.  Mastectomy QRs noninvasive breast cancer nearly 100% of the time.  If she had a mastectomy she would not need radiation.  The 2 options just described are standard.  The third option would not be standard namely proceed to radiation despite the positive margin.  She will be discussing these options with Dr. Marlou Starks.  She tells me she is leaning towards mastectomy and that if so she would not want reconstruction but simply use a prosthesis  Tentatively I have made her a virtual return appointment with me in about 3 months.  I suspect if she does go through a mastectomy that will be all she needs as far as this breast cancer is concerned and if so she would not need additional follow-up.  Total encounter time 25 minutes.Sarajane Jews C. Kenna Kirn, MD 02/03/2021 1:25 PM Medical Oncology and Hematology [facility] [place], [place] [address] Tel.  [phone]    Fax. [phone]   This document serves as a record  of services personally performed by Lurline Del, MD. It was created on his behalf by Wilburn Mylar, a trained medical scribe. The creation of this record is based on the scribe's personal observations and the provider's statements to them.   I, Lurline Del MD, have reviewed the above documentation for accuracy and completeness, and I agree with the above.   *Total Encounter Time as defined by the Centers for Medicare and Medicaid Services includes, in addition to the face-to-face time of a patient visit (documented in the note above) non-face-to-face time: obtaining and reviewing outside history, ordering and reviewing medications, tests or procedures, care coordination (communications with other health care professionals or caregivers) and documentation in the medical record.

## 2021-02-03 ENCOUNTER — Inpatient Hospital Stay (HOSPITAL_BASED_OUTPATIENT_CLINIC_OR_DEPARTMENT_OTHER): Payer: Medicare HMO | Admitting: Oncology

## 2021-02-03 ENCOUNTER — Inpatient Hospital Stay: Payer: Medicare HMO | Attending: Hematology

## 2021-02-03 ENCOUNTER — Other Ambulatory Visit: Payer: Self-pay

## 2021-02-03 ENCOUNTER — Other Ambulatory Visit: Payer: Self-pay | Admitting: *Deleted

## 2021-02-03 VITALS — BP 137/83 | HR 61 | Temp 98.1°F | Resp 18 | Ht 66.0 in | Wt 173.8 lb

## 2021-02-03 DIAGNOSIS — Z7982 Long term (current) use of aspirin: Secondary | ICD-10-CM | POA: Diagnosis not present

## 2021-02-03 DIAGNOSIS — D0511 Intraductal carcinoma in situ of right breast: Secondary | ICD-10-CM | POA: Insufficient documentation

## 2021-02-03 DIAGNOSIS — Z79899 Other long term (current) drug therapy: Secondary | ICD-10-CM | POA: Diagnosis not present

## 2021-02-03 DIAGNOSIS — D751 Secondary polycythemia: Secondary | ICD-10-CM | POA: Diagnosis not present

## 2021-02-03 LAB — CBC WITH DIFFERENTIAL/PLATELET
Abs Immature Granulocytes: 0.01 10*3/uL (ref 0.00–0.07)
Basophils Absolute: 0.1 10*3/uL (ref 0.0–0.1)
Basophils Relative: 1 %
Eosinophils Absolute: 0.2 10*3/uL (ref 0.0–0.5)
Eosinophils Relative: 3 %
HCT: 47.4 % — ABNORMAL HIGH (ref 36.0–46.0)
Hemoglobin: 15.7 g/dL — ABNORMAL HIGH (ref 12.0–15.0)
Immature Granulocytes: 0 %
Lymphocytes Relative: 25 %
Lymphs Abs: 1.8 10*3/uL (ref 0.7–4.0)
MCH: 31.8 pg (ref 26.0–34.0)
MCHC: 33.1 g/dL (ref 30.0–36.0)
MCV: 96 fL (ref 80.0–100.0)
Monocytes Absolute: 0.6 10*3/uL (ref 0.1–1.0)
Monocytes Relative: 9 %
Neutro Abs: 4.6 10*3/uL (ref 1.7–7.7)
Neutrophils Relative %: 62 %
Platelets: 195 10*3/uL (ref 150–400)
RBC: 4.94 MIL/uL (ref 3.87–5.11)
RDW: 12.1 % (ref 11.5–15.5)
WBC: 7.3 10*3/uL (ref 4.0–10.5)
nRBC: 0 % (ref 0.0–0.2)

## 2021-02-03 LAB — COMPREHENSIVE METABOLIC PANEL
ALT: 27 U/L (ref 0–44)
AST: 26 U/L (ref 15–41)
Albumin: 4.4 g/dL (ref 3.5–5.0)
Alkaline Phosphatase: 45 U/L (ref 38–126)
Anion gap: 8 (ref 5–15)
BUN: 12 mg/dL (ref 8–23)
CO2: 29 mmol/L (ref 22–32)
Calcium: 9.7 mg/dL (ref 8.9–10.3)
Chloride: 104 mmol/L (ref 98–111)
Creatinine, Ser: 0.83 mg/dL (ref 0.44–1.00)
GFR, Estimated: >60 mL/min (ref >60)
Glucose, Bld: 107 mg/dL — ABNORMAL HIGH (ref 70–99)
Potassium: 3.6 mmol/L (ref 3.5–5.1)
Sodium: 141 mmol/L (ref 135–145)
Total Bilirubin: 0.7 mg/dL (ref 0.3–1.2)
Total Protein: 8 g/dL (ref 6.5–8.1)

## 2021-02-04 ENCOUNTER — Ambulatory Visit: Payer: Self-pay | Admitting: General Surgery

## 2021-02-04 DIAGNOSIS — D0511 Intraductal carcinoma in situ of right breast: Secondary | ICD-10-CM

## 2021-02-05 ENCOUNTER — Other Ambulatory Visit: Payer: Self-pay | Admitting: Oncology

## 2021-02-05 ENCOUNTER — Encounter: Payer: Self-pay | Admitting: Oncology

## 2021-02-05 MED ORDER — KETOCONAZOLE 2 % EX CREA: 1.0000 "application " | TOPICAL_CREAM | Freq: Every day | CUTANEOUS | 4 refills | Status: DC

## 2021-02-06 ENCOUNTER — Encounter: Payer: Self-pay | Admitting: *Deleted

## 2021-02-11 ENCOUNTER — Ambulatory Visit: Payer: Medicare HMO | Admitting: Radiation Oncology

## 2021-02-11 DIAGNOSIS — C50911 Malignant neoplasm of unspecified site of right female breast: Secondary | ICD-10-CM | POA: Diagnosis not present

## 2021-02-12 ENCOUNTER — Ambulatory Visit: Payer: Medicare HMO | Admitting: Radiation Oncology

## 2021-02-14 NOTE — Progress Notes (Signed)
Your procedure is scheduled on Wednesday February 19, 2021.  Report to [facility] Main Entrance "A" at 06:30 A.M., and check in at the Admitting office.  Call this number if you have problems the morning of surgery: [phone]  Call [phone] if you have any questions prior to your surgery date Monday-Friday 8am-4pm   Remember: Do not eat midnight the night before your surgery  You may drink clear liquids until 06:30 the morning of your surgery.   Clear liquids allowed are: Water, Non-Citrus Juices (without pulp), Carbonated Beverages, Clear Tea, Black Coffee Only, and Gatorade   Take these medicines the morning of surgery with A SIP OF WATER: atenolol (TENORMIN)   If needed: acetaminophen (TYLENOL) HYDROcodone-acetaminophen (NORCO/VICODIN)  Follow your surgeon's instructions on when to stop Aspirin.  If no instructions were given by your surgeon then you will need to call the office to get those instructions.    As of today, STOP taking any Aleve, Naproxen, Ibuprofen, Motrin, Advil, Goody's, BC's, all herbal medications, fish oil, and all vitamins.    The Morning of Surgery  Do not wear jewelry, make-up or nail polish.  Do not wear lotions, powders, or perfumes, or deodorant  Do not shave 48 hours prior to surgery.  Do not bring valuables to the hospital.  [facility] is not responsible for any belongings or valuables.  If you are a smoker, DO NOT Smoke 24 hours prior to surgery  If you wear a CPAP at night please bring your mask the morning of surgery   Remember that you must have someone to transport you home after your surgery, and remain with you for 24 hours if you are discharged the same day.   Please bring cases for contacts, glasses, hearing aids, dentures or bridgework because it cannot be worn into surgery.    Leave your suitcase in the car.  After surgery it may be brought to your room.  For patients admitted to the hospital, discharge time will be  determined by your treatment team.  Patients discharged the day of surgery will not be allowed to drive home.    Special instructions:   [place]- Preparing For Surgery  Before surgery, you can play an important role. Because skin is not sterile, your skin needs to be as free of germs as possible. You can reduce the number of germs on your skin by washing with CHG (chlorahexidine gluconate) Soap before surgery.  CHG is an antiseptic cleaner which kills germs and bonds with the skin to continue killing germs even after washing.    Oral Hygiene is also important to reduce your risk of infection.  Remember - BRUSH YOUR TEETH THE MORNING OF SURGERY WITH YOUR REGULAR TOOTHPASTE  Please do not use if you have an allergy to CHG or antibacterial soaps. If your skin becomes reddened/irritated stop using the CHG.  Do not shave (including legs and underarms) for at least 48 hours prior to first CHG shower. It is OK to shave your face.  Please follow these instructions carefully.   1. Shower the NIGHT BEFORE SURGERY and the MORNING OF SURGERY with CHG Soap.   2. If you chose to wash your hair and body, wash as usual with your normal shampoo and body-wash/soap.  3. Rinse your hair and body thoroughly to remove the shampoo and soap.  4. Apply CHG directly to the skin (ONLY FROM THE NECK DOWN) and wash gently with a scrungie or a clean washcloth.   5.  Do not use on open wounds or open sores. Avoid contact with your eyes, ears, mouth and genitals (private parts). Wash Face and genitals (private parts)  with your normal soap.   6. Wash thoroughly, paying special attention to the area where your surgery will be performed.  7. Thoroughly rinse your body with warm water from the neck down.  8. DO NOT shower/wash with your normal soap after using and rinsing off the CHG Soap.  9. Pat yourself dry with a CLEAN TOWEL.  10. Wear CLEAN PAJAMAS to bed the night before surgery  11. Place CLEAN SHEETS on  your bed the night of your first shower and DO NOT SLEEP WITH PETS.  12. Wear comfortable clothes the morning of surgery.     Day of Surgery:  Please shower the morning of surgery with the CHG soap Do not apply any deodorants/lotions. Please wear clean clothes to the hospital/surgery center.   Remember to brush your teeth WITH YOUR REGULAR TOOTHPASTE.   Please read over the following fact sheets that you were given.

## 2021-02-15 ENCOUNTER — Other Ambulatory Visit (HOSPITAL_COMMUNITY)
Admission: RE | Admit: 2021-02-15 | Discharge: 2021-02-15 | Disposition: A | Discharge status: Home / Self Care | Payer: Medicare HMO | Source: Ambulatory Visit | Attending: General Surgery | Admitting: General Surgery

## 2021-02-15 DIAGNOSIS — Z20822 Contact with and (suspected) exposure to covid-19: Secondary | ICD-10-CM | POA: Insufficient documentation

## 2021-02-15 DIAGNOSIS — Z01812 Encounter for preprocedural laboratory examination: Secondary | ICD-10-CM | POA: Diagnosis not present

## 2021-02-15 LAB — SARS CORONAVIRUS 2 (TAT 6-24 HRS): SARS Coronavirus 2: NEGATIVE

## 2021-02-17 ENCOUNTER — Other Ambulatory Visit: Payer: Self-pay

## 2021-02-17 ENCOUNTER — Encounter (HOSPITAL_COMMUNITY): Payer: Self-pay

## 2021-02-17 ENCOUNTER — Encounter (HOSPITAL_COMMUNITY)
Admission: RE | Admit: 2021-02-17 | Discharge: 2021-02-17 | Disposition: A | Discharge status: Home / Self Care | Payer: Medicare HMO | Source: Ambulatory Visit | Attending: General Surgery | Admitting: General Surgery

## 2021-02-17 DIAGNOSIS — Z01812 Encounter for preprocedural laboratory examination: Secondary | ICD-10-CM | POA: Insufficient documentation

## 2021-02-17 LAB — CBC
HCT: 49.5 % — ABNORMAL HIGH (ref 36.0–46.0)
Hemoglobin: 16.3 g/dL — ABNORMAL HIGH (ref 12.0–15.0)
MCH: 31.8 pg (ref 26.0–34.0)
MCHC: 32.9 g/dL (ref 30.0–36.0)
MCV: 96.7 fL (ref 80.0–100.0)
Platelets: 177 10*3/uL (ref 150–400)
RBC: 5.12 MIL/uL — ABNORMAL HIGH (ref 3.87–5.11)
RDW: 12.1 % (ref 11.5–15.5)
WBC: 7 10*3/uL (ref 4.0–10.5)
nRBC: 0 % (ref 0.0–0.2)

## 2021-02-17 LAB — BASIC METABOLIC PANEL
Anion gap: 10 (ref 5–15)
BUN: 13 mg/dL (ref 8–23)
CO2: 26 mmol/L (ref 22–32)
Calcium: 9.6 mg/dL (ref 8.9–10.3)
Chloride: 102 mmol/L (ref 98–111)
Creatinine, Ser: 0.72 mg/dL (ref 0.44–1.00)
GFR, Estimated: >60 mL/min (ref >60)
Glucose, Bld: 104 mg/dL — ABNORMAL HIGH (ref 70–99)
Potassium: 3.8 mmol/L (ref 3.5–5.1)
Sodium: 138 mmol/L (ref 135–145)

## 2021-02-17 NOTE — Progress Notes (Signed)
PCP - Orpah Melter, MD Cardiologist - Marlou Porch, MD  Chest x-ray -  EKG -  Stress Test -  ECHO - 12/19/15 Cardiac Cath - 2013  Blood Thinner Instructions:  Aspirin Instructions: ASA last dose per pt 02/13/21  ERAS Protcol - yes-no drink ordered   COVID TEST- 02/15/21  Coronavirus Screening  Have you experienced the following symptoms:  Cough yes/no: No Fever (>100.64F)  yes/no: No Runny nose yes/no: No Sore throat yes/no: No Difficulty breathing/shortness of breath  yes/no: No  Have you or a family member traveled in the last 14 days and where? yes/no: No   If the patient indicates "YES" to the above questions, their PAT will be rescheduled to limit the exposure to others and, the surgeon will be notified. THE PATIENT WILL NEED TO BE ASYMPTOMATIC FOR 14 DAYS.   If the patient is not experiencing any of these symptoms, the PAT nurse will instruct them to NOT bring anyone with them to their appointment since they may have these symptoms or traveled as well.   Please remind your patients and families that hospital visitation restrictions are in effect and the importance of the restrictions.     Anesthesia review: n/a  Patient denies shortness of breath, fever, cough and chest pain at PAT appointment   All instructions explained to the patient, with a verbal understanding of the material. Patient agrees to go over the instructions while at home for a better understanding. Patient also instructed to self quarantine after being tested for COVID-19. The opportunity to ask questions was provided.

## 2021-02-19 ENCOUNTER — Encounter (HOSPITAL_COMMUNITY)
Admission: RE | Disposition: A | Discharge status: Home / Self Care | Payer: Self-pay | Source: Home / Self Care | Attending: General Surgery

## 2021-02-19 ENCOUNTER — Ambulatory Visit (HOSPITAL_COMMUNITY)
Admission: RE | Admit: 2021-02-19 | Discharge: 2021-02-19 | Disposition: A | Discharge status: Home / Self Care | Payer: Medicare HMO | Source: Ambulatory Visit | Attending: General Surgery | Admitting: General Surgery

## 2021-02-19 ENCOUNTER — Ambulatory Visit (HOSPITAL_COMMUNITY): Payer: Medicare HMO | Admitting: Certified Registered"

## 2021-02-19 ENCOUNTER — Ambulatory Visit (HOSPITAL_COMMUNITY)
Admission: RE | Admit: 2021-02-19 | Discharge: 2021-02-20 | Disposition: A | Discharge status: Home / Self Care | Payer: Medicare HMO | Attending: General Surgery | Admitting: General Surgery

## 2021-02-19 ENCOUNTER — Encounter (HOSPITAL_COMMUNITY): Payer: Self-pay | Admitting: General Surgery

## 2021-02-19 ENCOUNTER — Other Ambulatory Visit: Payer: Self-pay

## 2021-02-19 DIAGNOSIS — Z791 Long term (current) use of non-steroidal anti-inflammatories (NSAID): Secondary | ICD-10-CM | POA: Insufficient documentation

## 2021-02-19 DIAGNOSIS — Z880 Allergy status to penicillin: Secondary | ICD-10-CM | POA: Insufficient documentation

## 2021-02-19 DIAGNOSIS — Z7982 Long term (current) use of aspirin: Secondary | ICD-10-CM | POA: Diagnosis not present

## 2021-02-19 DIAGNOSIS — D0512 Intraductal carcinoma in situ of left breast: Secondary | ICD-10-CM | POA: Diagnosis not present

## 2021-02-19 DIAGNOSIS — Z91040 Latex allergy status: Secondary | ICD-10-CM | POA: Diagnosis not present

## 2021-02-19 DIAGNOSIS — Z955 Presence of coronary angioplasty implant and graft: Secondary | ICD-10-CM | POA: Diagnosis not present

## 2021-02-19 DIAGNOSIS — D0511 Intraductal carcinoma in situ of right breast: Secondary | ICD-10-CM | POA: Diagnosis present

## 2021-02-19 DIAGNOSIS — Z79899 Other long term (current) drug therapy: Secondary | ICD-10-CM | POA: Insufficient documentation

## 2021-02-19 DIAGNOSIS — N641 Fat necrosis of breast: Secondary | ICD-10-CM | POA: Diagnosis not present

## 2021-02-19 DIAGNOSIS — I10 Essential (primary) hypertension: Secondary | ICD-10-CM | POA: Diagnosis not present

## 2021-02-19 DIAGNOSIS — Z951 Presence of aortocoronary bypass graft: Secondary | ICD-10-CM | POA: Diagnosis not present

## 2021-02-19 DIAGNOSIS — E782 Mixed hyperlipidemia: Secondary | ICD-10-CM | POA: Diagnosis not present

## 2021-02-19 DIAGNOSIS — Z9882 Breast implant status: Secondary | ICD-10-CM | POA: Diagnosis not present

## 2021-02-19 DIAGNOSIS — Z888 Allergy status to other drugs, medicaments and biological substances status: Secondary | ICD-10-CM | POA: Diagnosis not present

## 2021-02-19 DIAGNOSIS — Z171 Estrogen receptor negative status [ER-]: Secondary | ICD-10-CM | POA: Insufficient documentation

## 2021-02-19 DIAGNOSIS — G8918 Other acute postprocedural pain: Secondary | ICD-10-CM | POA: Diagnosis not present

## 2021-02-19 DIAGNOSIS — N6011 Diffuse cystic mastopathy of right breast: Secondary | ICD-10-CM | POA: Diagnosis not present

## 2021-02-19 DIAGNOSIS — Z882 Allergy status to sulfonamides status: Secondary | ICD-10-CM | POA: Insufficient documentation

## 2021-02-19 HISTORY — PX: SENTINEL NODE BIOPSY: SHX6608

## 2021-02-19 HISTORY — PX: MASTECTOMY W/ SENTINEL NODE BIOPSY: SHX2001

## 2021-02-19 SURGERY — MASTECTOMY WITH SENTINEL LYMPH NODE BIOPSY
Anesthesia: Regional | Site: Breast | Laterality: Right

## 2021-02-19 MED ORDER — CHLORHEXIDINE GLUCONATE CLOTH 2 % EX PADS: 6.0000 | MEDICATED_PAD | Freq: Once | CUTANEOUS | Status: DC

## 2021-02-19 MED ORDER — METHOCARBAMOL 500 MG PO TABS: 500.0000 mg | ORAL_TABLET | Freq: Four times a day (QID) | ORAL | Status: DC | PRN

## 2021-02-19 MED ORDER — PROPOFOL 500 MG/50ML IV EMUL
INTRAVENOUS | Status: DC | PRN
  Administered 2021-02-19: 150 ug/kg/min via INTRAVENOUS

## 2021-02-19 MED ORDER — ORAL CARE MOUTH RINSE: 15.0000 mL | Freq: Once | OROMUCOSAL | Status: AC

## 2021-02-19 MED ORDER — FENTANYL CITRATE (PF) 250 MCG/5ML IJ SOLN
INTRAMUSCULAR | Status: DC | PRN
  Administered 2021-02-19 (×2): 50 ug via INTRAVENOUS

## 2021-02-19 MED ORDER — PROPOFOL 10 MG/ML IV BOLUS
INTRAVENOUS | Status: DC | PRN
  Administered 2021-02-19: 100 mg via INTRAVENOUS

## 2021-02-19 MED ORDER — ACETAMINOPHEN 10 MG/ML IV SOLN: 1000.0000 mg | Freq: Once | INTRAVENOUS | Status: DC | PRN

## 2021-02-19 MED ORDER — OXYCODONE HCL 5 MG/5ML PO SOLN: 5.0000 mg | Freq: Once | ORAL | Status: DC | PRN

## 2021-02-19 MED ORDER — ONDANSETRON HCL 4 MG/2ML IJ SOLN: 4.0000 mg | Freq: Four times a day (QID) | INTRAMUSCULAR | Status: DC | PRN

## 2021-02-19 MED ORDER — LIDOCAINE 2% (20 MG/ML) 5 ML SYRINGE
INTRAMUSCULAR | Status: DC | PRN
  Administered 2021-02-19: 60 mg via INTRAVENOUS

## 2021-02-19 MED ORDER — FUROSEMIDE 20 MG PO TABS
20.0000 mg | ORAL_TABLET | Freq: Every day | ORAL | Status: DC
Start: 2021-02-19 — End: 2021-02-20
  Administered 2021-02-19: 20 mg via ORAL
  Filled 2021-02-19 (×2): qty 1

## 2021-02-19 MED ORDER — ATENOLOL 25 MG PO TABS
50.0000 mg | ORAL_TABLET | Freq: Every day | ORAL | Status: DC
  Filled 2021-02-19: qty 2

## 2021-02-19 MED ORDER — SODIUM CHLORIDE 0.9 % IV SOLN
INTRAVENOUS | Status: DC
  Administered 2021-02-19: 990 mL via INTRAVENOUS

## 2021-02-19 MED ORDER — OXYCODONE HCL 5 MG PO TABS: 5.0000 mg | ORAL_TABLET | Freq: Once | ORAL | Status: DC | PRN

## 2021-02-19 MED ORDER — MIDAZOLAM HCL 2 MG/2ML IJ SOLN
INTRAMUSCULAR | Status: AC
  Filled 2021-02-19: qty 2

## 2021-02-19 MED ORDER — ACETAMINOPHEN 160 MG/5ML PO SOLN: 1000.0000 mg | Freq: Once | ORAL | Status: DC | PRN

## 2021-02-19 MED ORDER — LACTATED RINGERS IV SOLN: INTRAVENOUS | Status: DC

## 2021-02-19 MED ORDER — ACETAMINOPHEN 500 MG PO TABS: 1000.0000 mg | ORAL_TABLET | Freq: Once | ORAL | Status: DC | PRN

## 2021-02-19 MED ORDER — CEFAZOLIN SODIUM-DEXTROSE 2-4 GM/100ML-% IV SOLN
2.0000 g | INTRAVENOUS | Status: AC
  Administered 2021-02-19: 2 g via INTRAVENOUS
  Filled 2021-02-19: qty 100

## 2021-02-19 MED ORDER — GABAPENTIN 300 MG PO CAPS
300.0000 mg | ORAL_CAPSULE | ORAL | Status: AC
  Administered 2021-02-19: 300 mg via ORAL
  Filled 2021-02-19: qty 1

## 2021-02-19 MED ORDER — FENTANYL CITRATE (PF) 100 MCG/2ML IJ SOLN
INTRAMUSCULAR | Status: AC
  Administered 2021-02-19: 25 ug via INTRAVENOUS
  Filled 2021-02-19: qty 2

## 2021-02-19 MED ORDER — ONDANSETRON HCL 4 MG/2ML IJ SOLN
INTRAMUSCULAR | Status: DC | PRN
  Administered 2021-02-19: 4 mg via INTRAVENOUS

## 2021-02-19 MED ORDER — HYDROCODONE-ACETAMINOPHEN 5-325 MG PO TABS
1.0000 | ORAL_TABLET | Freq: Four times a day (QID) | ORAL | Status: DC | PRN
  Administered 2021-02-19: 1 via ORAL
  Filled 2021-02-19: qty 1

## 2021-02-19 MED ORDER — ACETAMINOPHEN 500 MG PO TABS
1000.0000 mg | ORAL_TABLET | ORAL | Status: AC
  Administered 2021-02-19: 1000 mg via ORAL
  Filled 2021-02-19: qty 2

## 2021-02-19 MED ORDER — DEXAMETHASONE SODIUM PHOSPHATE 10 MG/ML IJ SOLN
INTRAMUSCULAR | Status: DC | PRN
  Administered 2021-02-19: 5 mg via INTRAVENOUS

## 2021-02-19 MED ORDER — ROSUVASTATIN CALCIUM 20 MG PO TABS
20.0000 mg | ORAL_TABLET | Freq: Every evening | ORAL | Status: DC
  Administered 2021-02-19: 20 mg via ORAL
  Filled 2021-02-19: qty 1

## 2021-02-19 MED ORDER — MORPHINE SULFATE (PF) 2 MG/ML IV SOLN: 1.0000 mg | INTRAVENOUS | Status: DC | PRN

## 2021-02-19 MED ORDER — CHLORHEXIDINE GLUCONATE 0.12 % MT SOLN
15.0000 mL | Freq: Once | OROMUCOSAL | Status: AC
  Administered 2021-02-19: 15 mL via OROMUCOSAL
  Filled 2021-02-19: qty 15

## 2021-02-19 MED ORDER — HEPARIN SODIUM (PORCINE) 5000 UNIT/ML IJ SOLN
5000.0000 [IU] | Freq: Three times a day (TID) | INTRAMUSCULAR | Status: DC
  Administered 2021-02-20: 5000 [IU] via SUBCUTANEOUS
  Filled 2021-02-19: qty 1

## 2021-02-19 MED ORDER — PANTOPRAZOLE SODIUM 40 MG IV SOLR
40.0000 mg | Freq: Every day | INTRAVENOUS | Status: DC
  Administered 2021-02-19: 40 mg via INTRAVENOUS
  Filled 2021-02-19: qty 40

## 2021-02-19 MED ORDER — SODIUM CHLORIDE (PF) 0.9 % IJ SOLN
INTRAMUSCULAR | Status: AC
  Filled 2021-02-19: qty 10

## 2021-02-19 MED ORDER — METHYLENE BLUE 0.5 % INJ SOLN
INTRAVENOUS | Status: AC
  Filled 2021-02-19: qty 10

## 2021-02-19 MED ORDER — ONDANSETRON 4 MG PO TBDP: 4.0000 mg | ORAL_TABLET | Freq: Four times a day (QID) | ORAL | Status: DC | PRN

## 2021-02-19 MED ORDER — 0.9 % SODIUM CHLORIDE (POUR BTL) OPTIME
TOPICAL | Status: DC | PRN
  Administered 2021-02-19: 1000 mL

## 2021-02-19 MED ORDER — FENTANYL CITRATE (PF) 100 MCG/2ML IJ SOLN
25.0000 ug | INTRAMUSCULAR | Status: DC | PRN
  Administered 2021-02-19: 25 ug via INTRAVENOUS

## 2021-02-19 MED ORDER — FENTANYL CITRATE (PF) 250 MCG/5ML IJ SOLN
INTRAMUSCULAR | Status: AC
  Filled 2021-02-19: qty 5

## 2021-02-19 MED ORDER — PROPOFOL 10 MG/ML IV BOLUS
INTRAVENOUS | Status: AC
  Filled 2021-02-19: qty 20

## 2021-02-19 SURGICAL SUPPLY — 44 items
APPLIER CLIP 9.375 MED OPEN (MISCELLANEOUS) ×2
BINDER BREAST LRG (GAUZE/BANDAGES/DRESSINGS) IMPLANT
BINDER BREAST XLRG (GAUZE/BANDAGES/DRESSINGS) ×2 IMPLANT
BIOPATCH RED 1 DISK 7.0 (GAUZE/BANDAGES/DRESSINGS) ×2 IMPLANT
CANISTER SUCT 3000ML PPV (MISCELLANEOUS) ×2 IMPLANT
CHLORAPREP W/TINT 26 (MISCELLANEOUS) ×2 IMPLANT
CLIP APPLIE 9.375 MED OPEN (MISCELLANEOUS) ×1 IMPLANT
CNTNR URN SCR LID CUP LEK RST (MISCELLANEOUS) ×1 IMPLANT
CONT SPEC 4OZ STRL OR WHT (MISCELLANEOUS) ×2
COVER PROBE W GEL 5X96 (DRAPES) ×2 IMPLANT
COVER SURGICAL LIGHT HANDLE (MISCELLANEOUS) ×2 IMPLANT
DERMABOND ADVANCED (GAUZE/BANDAGES/DRESSINGS) ×1
DERMABOND ADVANCED .7 DNX12 (GAUZE/BANDAGES/DRESSINGS) ×1 IMPLANT
DEVICE DSSCT PLSMBLD 3.0S LGHT (MISCELLANEOUS) ×1 IMPLANT
DRAIN CHANNEL 19F RND (DRAIN) ×2 IMPLANT
DRAPE CHEST BREAST 15X10 FENES (DRAPES) ×2 IMPLANT
DRSG PAD ABDOMINAL 8X10 ST (GAUZE/BANDAGES/DRESSINGS) IMPLANT
DRSG TEGADERM 4X4.75 (GAUZE/BANDAGES/DRESSINGS) ×2 IMPLANT
ELECT CAUTERY BLADE 6.4 (BLADE) ×2 IMPLANT
ELECT REM PT RETURN 9FT ADLT (ELECTROSURGICAL) ×2
ELECTRODE REM PT RTRN 9FT ADLT (ELECTROSURGICAL) ×1 IMPLANT
EVACUATOR SILICONE 100CC (DRAIN) ×2 IMPLANT
GAUZE SPONGE 4X4 12PLY STRL (GAUZE/BANDAGES/DRESSINGS) ×2 IMPLANT
GLOVE BIO SURGEON STRL SZ7.5 (GLOVE) ×2 IMPLANT
GOWN STRL REUS W/ TWL LRG LVL3 (GOWN DISPOSABLE) ×3 IMPLANT
GOWN STRL REUS W/TWL LRG LVL3 (GOWN DISPOSABLE) ×6
KIT BASIN OR (CUSTOM PROCEDURE TRAY) ×2 IMPLANT
KIT TURNOVER KIT B (KITS) ×2 IMPLANT
NS IRRIG 1000ML POUR BTL (IV SOLUTION) ×2 IMPLANT
PACK GENERAL/GYN (CUSTOM PROCEDURE TRAY) ×2 IMPLANT
PAD ARMBOARD 7.5X6 YLW CONV (MISCELLANEOUS) ×2 IMPLANT
PENCIL SMOKE EVACUATOR (MISCELLANEOUS) ×2 IMPLANT
PLASMABLADE 3.0S W/LIGHT (MISCELLANEOUS) ×2
SPECIMEN JAR X LARGE (MISCELLANEOUS) ×2 IMPLANT
SUT ETHILON 3 0 FSL (SUTURE) ×2 IMPLANT
SUT MNCRL AB 4-0 PS2 18 (SUTURE) ×4 IMPLANT
SUT VIC AB 0 CT1 27 (SUTURE) ×4
SUT VIC AB 0 CT1 27XBRD ANBCTR (SUTURE) ×2 IMPLANT
SUT VIC AB 3-0 SH 18 (SUTURE) ×4 IMPLANT
SUT VIC AB 3-0 SH 8-18 (SUTURE) ×2 IMPLANT
TOWEL GREEN STERILE (TOWEL DISPOSABLE) ×2 IMPLANT
TOWEL GREEN STERILE FF (TOWEL DISPOSABLE) ×2 IMPLANT
TUBE CONNECTING 12X1/4 (SUCTIONS) ×2 IMPLANT
TUBE CONNECTING 20X1/4 (TUBING) ×2 IMPLANT

## 2021-02-19 NOTE — Op Note (Signed)
02/19/2021  9:56 AM  PATIENT:  [name]  75 y.o. female  PRE-OPERATIVE DIAGNOSIS:  RIGHT BREAST DCIS  POST-OPERATIVE DIAGNOSIS:  RIGHT BREAST DCIS  PROCEDURE:  Procedure(s) with comments: RIGHT MASTECTOMY WITH DEEP RIGHT AXILLARY SENTINEL LYMPH NODE BIOPSY (Right)  SURGEON:  Surgeon(s) and Role:    * Jovita Kussmaul, MD - Primary  PHYSICIAN ASSISTANT:   ASSISTANTS: Judyann Munson, RNFA   ANESTHESIA:   general  EBL:  minimal   BLOOD ADMINISTERED:none  DRAINS: (1) Jackson-Pratt drain(s) with closed bulb suction in the prepectoral space   LOCAL MEDICATIONS USED:  NONE  SPECIMEN:  Source of Specimen:  right mastectomy and sentinel node  DISPOSITION OF SPECIMEN:  PATHOLOGY  COUNTS:  YES  TOURNIQUET:  * No tourniquets in log *  DICTATION: .Dragon Dictation   After informed consent was obtained the patient was brought to the operating room and placed in the supine position on the operating table.  After adequate induction of general anesthesia the patient's right chest, breast, and axillary area were prepped with ChloraPrep, allowed to dry, and draped in usual sterile manner.  An appropriate timeout was performed.  Earlier in the day the patient underwent injection of 1 mCi of technetium sulfur colloid in the subareolar position on the right.  The neoprobe was set to technetium and there was a good signal in the right axilla.  An elliptical incision was then made around the nipple and areola complex in order to minimize the excess skin.  The incision was carried through the skin and subcutaneous tissue sharply with the PlasmaBlade.  Skin hooks were used to elevate the skin flaps anteriorly towards the ceiling.  Thin skin flaps were then created by dissecting between the breast tissue and the subcutaneous fat.  This dissection was carried circumferentially all the way to the chest wall.  Next the breast was removed from the pectoralis muscle with the pectoralis fashion.   Once this dissection was complete then the entire right breast was removed from the patient.  It was marked with a stitch on the lateral skin and sent to pathology for further evaluation.  The neoprobe was then used to direct blunt hemostat dissection into the deep right axillary space.  I was able to identify a hot lymph node.  This lymph node was excised sharply with the PlasmaBlade and the surrounding small vessels and lymphatics were controlled with clips.  Ex vivo counts on this node were approximately 600.  No other hot or palpable nodes were identified in the right axilla.  Hemostasis was achieved using the PlasmaBlade.  A couple small perforating vessels medially were controlled with 3-0 Vicryl figure-of-eight stitches.  The wound was then irrigated with copious amounts of saline.  A small stab incision was made near the anterior axillary line inferior to the operative bed.  A tonsil clamp was placed through this opening and used to bring a 19 [place] round Blake drain into the operative bed.  The drain was curled along the chest wall.  The drain was anchored to the skin with a 3-0 nylon stitch.  Next the superior and inferior skin flaps were grossly reapproximated with interrupted 3-0 Vicryl stitches.  The skin was then closed with running 4-0 Monocryl subcuticular stitches.  Dermabond dressings and sterile drain dressings were applied along with 4 x 4's and a binder.  The patient tolerated the procedure well.  At the end of the case all needle sponge and instrument counts were correct.  The  patient was then awakened and taken recovery in stable condition.  PLAN OF CARE: Admit for overnight observation  PATIENT DISPOSITION:  PACU - hemodynamically stable.   Delay start of Pharmacological VTE agent (>24hrs) due to surgical blood loss or risk of bleeding: no

## 2021-02-19 NOTE — Interval H&P Note (Signed)
History and Physical Interval Note:  02/19/2021 8:09 AM  [name]  has presented today for surgery, with the diagnosis of RIGHT BREAST DCIS.  The various methods of treatment have been discussed with the patient and family. After consideration of risks, benefits and other options for treatment, the patient has consented to  Procedure(s) with comments: RIGHT MASTECTOMY WITH SENTINEL LYMPH NODE BIOPSY (Right) - RNFA, PEC BLOCK as a surgical intervention.  The patient's history has been reviewed, patient examined, no change in status, stable for surgery.  I have reviewed the patient's chart and labs.  Questions were answered to the patient's satisfaction.     [name]

## 2021-02-19 NOTE — Anesthesia Preprocedure Evaluation (Signed)
Anesthesia Evaluation  Patient identified by MRN, date of birth, ID band Patient awake    Reviewed: Allergy & Precautions, NPO status , Patient's Chart, lab work & pertinent test results  History of Anesthesia Complications Negative for: history of anesthetic complications  Airway Mallampati: III  TM Distance: >3 FB Neck ROM: Full    Dental  (+) Dental Advisory Given, Teeth Intact   Pulmonary neg pulmonary ROS,  Covid-19 Nucleic Acid Test Results Lab Results      Component                Value               Date                      SARSCOV2NAA              NEGATIVE            02/15/2021                [place]              NEGATIVE            01/11/2021                [place]              NEGATIVE            12/09/2020              breath sounds clear to auscultation       Cardiovascular hypertension, Pt. on medications and Pt. on home beta blockers + CAD, + Cardiac Stents and + CABG   Rhythm:Regular  Left ventricle: The cavity size was normal. Wall thickness was  normal. Systolic function was normal. The estimated ejection  fraction was in the range of 55% to 60%. Wall motion was normal;  there were no regional wall motion abnormalities. Features are  consistent with a pseudonormal left ventricular filling pattern,  with concomitant abnormal relaxation and increased filling  pressure (grade 2 diastolic dysfunction). Doppler parameters are  consistent with high ventricular filling pressure.  - Left atrium: The atrium was mildly dilated.  - Pulmonary arteries: Systolic pressure was mildly increased.    Neuro/Psych PSYCHIATRIC DISORDERS Dementia negative neurological ROS     GI/Hepatic negative GI ROS,   Endo/Other  negative endocrine ROS  Renal/GU negative Renal ROSLab Results      Component                Value               Date                      CREATININE               0.72                 02/17/2021           Lab Results      Component                Value               Date                      K  3.8                 02/17/2021                Musculoskeletal  (+) Arthritis ,   Abdominal   Peds  Hematology Lab Results      Component                Value               Date                      WBC                      7.0                 02/17/2021                HGB                      16.3 (H)            02/17/2021                HCT                      49.5 (H)            02/17/2021                MCV                      96.7                02/17/2021                PLT                      177                 02/17/2021              Anesthesia Other Findings Coronary artery disease status post CABG in 2012 Cardiac catheterization 02/2012-vein grafts were occluded, LIMA to LAD was patent, circumflex stent was placed DES.  Reproductive/Obstetrics                             Anesthesia Physical Anesthesia Plan  ASA: III  Anesthesia Plan: General and Regional   Post-op Pain Management:  Regional for Post-op pain   Induction: Intravenous  PONV Risk Score and Plan: 3 and Propofol infusion, Ondansetron, Dexamethasone and TIVA  Airway Management Planned: LMA  Additional Equipment: None  Intra-op Plan:   Post-operative Plan: Extubation in OR  Informed Consent: I have reviewed the patients History and Physical, chart, labs and discussed the procedure including the risks, benefits and alternatives for the proposed anesthesia with the patient or authorized representative who has indicated his/her understanding and acceptance.     Dental advisory given  Plan Discussed with: CRNA and Surgeon  Anesthesia Plan Comments:         Anesthesia Quick Evaluation

## 2021-02-19 NOTE — Anesthesia Procedure Notes (Signed)
Procedure Name: LMA Insertion Date/Time: 02/19/2021 8:40 AM Performed by: Imagene Riches, CRNA Pre-anesthesia Checklist: Patient identified, Emergency Drugs available, Suction available and Patient being monitored Patient Re-evaluated:Patient Re-evaluated prior to induction Oxygen Delivery Method: Circle System Utilized Preoxygenation: Pre-oxygenation with 100% oxygen Induction Type: IV induction Ventilation: Mask ventilation without difficulty LMA: LMA inserted LMA Size: 4.0 Number of attempts: 1 Airway Equipment and Method: Bite block Placement Confirmation: positive ETCO2 Tube secured with: Tape Dental Injury: Teeth and Oropharynx as per pre-operative assessment

## 2021-02-19 NOTE — Transfer of Care (Signed)
Immediate Anesthesia Transfer of Care Note  Patient: [name]  Procedure(s) Performed: RIGHT MASTECTOMY (Right Breast) SENTINEL LYMPH NODE BIOPSY (Right Breast)  Patient Location: PACU  Anesthesia Type:GA combined with regional for post-op pain  Level of Consciousness: drowsy  Airway & Oxygen Therapy: Patient Spontanous Breathing and Patient connected to nasal cannula oxygen  Post-op Assessment: Report given to RN and Post -op Vital signs reviewed and stable  Post vital signs: Reviewed and stable  Last Vitals:  Vitals Value Taken Time  BP 134/56 02/19/21 1009  Temp    Pulse 56 02/19/21 1011  Resp 12 02/19/21 1011  SpO2 100 % 02/19/21 1011  Vitals shown include unvalidated device data.  Last Pain:  Vitals:   02/19/21 0654  TempSrc:   PainSc: 0-No pain         Complications: No complications documented.

## 2021-02-19 NOTE — H&P (Signed)
[name]  Location: [facility] Patient #: [id number] DOB: [birth year] Married / Language: English / Race: White Female   History of Present Illness The patient is a 75 year old female who presents for a follow-up for Breast cancer. 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 Unfortunately she still has a positive medial margin. After much consideration she has now elected to have a right mastectomy with removal of her implant. She is very happy with this decision.   Problem List/Past Medical  DUCTAL CARCINOMA IN SITU (DCIS) OF RIGHT BREAST (D05.11)   Past Surgical History  Breast Augmentation  Bilateral. Breast Biopsy  Right. Hysterectomy (not due to cancer) - Partial  Oral Surgery   Diagnostic Studies History Mammogram  within last year  Allergies  Penicillin G Sodium *PENICILLINS*  Sulfa 10 *OPHTHALMIC AGENTS*  Norvasc *CALCIUM CHANNEL BLOCKERS*  Lisinopril *CHEMICALS*  Lipitor *ANTIHYPERLIPIDEMICS*  Latex  Adhesive Tape 1/2"x10yd *MEDICAL DEVICES AND SUPPLIES*   Medication History Aspirin (81MG  Tablet, Oral) Active. Atenolol (50MG  Tablet, Oral) Active. Furosemide (20MG  Tablet, Oral) Active. Rosuvastatin Calcium (20MG  Tablet, Oral) Active. Potassium (Oral) Specific strength unknown - Active. Multi-Vitamin (Oral) Active. Tylenol (500MG  Capsule, Oral) Active. Advil (200MG  Capsule, Oral) Active. Stress B Complex (Oral) Active. Medications Reconciled  Social History  Alcohol use  Occasional alcohol use. Caffeine use  Coffee. No drug use  Tobacco use  Never smoker.  Family History  Alcohol Abuse  Father. Heart Disease  Brother, Father, Mother, Son. Heart disease in female family member before age 74  Heart disease in female family member before age 49   Pregnancy / Birth  History  Age of menopause  39-51 Length (months) of breastfeeding  3-6 Maternal age  13-25 Para  2  Other Problems  Breast Cancer  Heart murmur  High blood pressure     Review of Systems  General Not Present- Appetite Loss, Chills, Fatigue, Fever, Night Sweats, Weight Gain and Weight Loss. Skin Present- Dryness. Not Present- Change in Wart/Mole, Hives, Jaundice, New Lesions, Non-Healing Wounds, Rash and Ulcer. HEENT Present- Seasonal Allergies and Wears glasses/contact lenses. Not Present- Earache, Hearing Loss, Hoarseness, Nose Bleed, Oral Ulcers, Ringing in the Ears, Sinus Pain, Sore Throat, Visual Disturbances and Yellow Eyes. Breast Not Present- Breast Mass, Breast Pain, Nipple Discharge and Skin Changes. Cardiovascular Not Present- Chest Pain, Difficulty Breathing Lying Down, Leg Cramps, Palpitations, Rapid Heart Rate, Shortness of Breath and Swelling of Extremities. Female Genitourinary Not Present- Frequency, Nocturia, Painful Urination, Pelvic Pain and Urgency. Neurological Present- Decreased Memory. Not Present- Fainting, Headaches, Numbness, Seizures, Tingling, Tremor, Trouble walking and Weakness. Psychiatric Not Present- Anxiety, Bipolar, Change in Sleep Pattern, Depression, Fearful and Frequent crying. Endocrine Not Present- Cold Intolerance, Excessive Hunger, Hair Changes, Heat Intolerance, Hot flashes and New Diabetes. Hematology Present- Blood Thinners. Not Present- Easy Bruising, Excessive bleeding, Gland problems, HIV and Persistent Infections.   Physical Exam General Mental Status-Alert. General Appearance-Consistent with stated age. Hydration-Well hydrated. Voice-Normal.  Head and Neck Head-normocephalic, atraumatic with no lesions or palpable masses. Trachea-midline. Thyroid Gland Characteristics - normal size and consistency.  Eye Eyeball - Bilateral-Extraocular movements intact. Sclera/Conjunctiva - Bilateral-No scleral  icterus.  Chest and Lung Exam Chest and lung exam reveals -quiet, even and easy respiratory effort with no use of accessory muscles and on auscultation, normal breath sounds, no adventitious sounds and normal  vocal resonance. Inspection Chest Wall - Normal. Back - normal.  Breast Note: The right breast upper outer quadrant incision is healing nicely with no sign of infection or seroma.   Cardiovascular Cardiovascular examination reveals -normal heart sounds, regular rate and rhythm with no murmurs and normal pedal pulses bilaterally.  Abdomen Inspection Inspection of the abdomen reveals - No Hernias. Skin - Scar - no surgical scars. Palpation/Percussion Palpation and Percussion of the abdomen reveal - Soft, Non Tender, No Rebound tenderness, No Rigidity (guarding) and No hepatosplenomegaly. Auscultation Auscultation of the abdomen reveals - Bowel sounds normal.  Neurologic Neurologic evaluation reveals -alert and oriented x 3 with no impairment of recent or remote memory. Mental Status-Normal.  Musculoskeletal Normal Exam - Left-Upper Extremity Strength Normal and Lower Extremity Strength Normal. Normal Exam - Right-Upper Extremity Strength Normal and Lower Extremity Strength Normal.  Lymphatic Head & Neck  General Head & Neck Lymphatics: Bilateral - Description - Normal. Axillary  General Axillary Region: Bilateral - Description - Normal. Tenderness - Non Tender. Femoral & Inguinal  Generalized Femoral & Inguinal Lymphatics: Bilateral - Description - Normal. Tenderness - Non Tender.    Assessment & Plan  DUCTAL CARCINOMA IN SITU (DCIS) OF RIGHT BREAST (D05.11) Impression: The patient is about 2 weeks status post reexcision of margins from the right breast for ductal carcinoma in situ. Unfortunately she still has a positive medial margin. After much consideration she has now decided that she would like to have a right mastectomy with no reconstruction. She  understands that her implant will be removed. I have discussed with her in detail the risks and benefits of the operation as well as some of the technical aspects and she understands and wishes to proceed. I will plan for right mastectomy with sentinel node biopsy.

## 2021-02-20 ENCOUNTER — Encounter (HOSPITAL_COMMUNITY): Payer: Self-pay | Admitting: General Surgery

## 2021-02-20 DIAGNOSIS — Z791 Long term (current) use of non-steroidal anti-inflammatories (NSAID): Secondary | ICD-10-CM | POA: Diagnosis not present

## 2021-02-20 DIAGNOSIS — Z951 Presence of aortocoronary bypass graft: Secondary | ICD-10-CM | POA: Diagnosis not present

## 2021-02-20 DIAGNOSIS — N641 Fat necrosis of breast: Secondary | ICD-10-CM | POA: Diagnosis not present

## 2021-02-20 DIAGNOSIS — Z880 Allergy status to penicillin: Secondary | ICD-10-CM | POA: Diagnosis not present

## 2021-02-20 DIAGNOSIS — Z79899 Other long term (current) drug therapy: Secondary | ICD-10-CM | POA: Diagnosis not present

## 2021-02-20 DIAGNOSIS — D0511 Intraductal carcinoma in situ of right breast: Secondary | ICD-10-CM | POA: Diagnosis not present

## 2021-02-20 DIAGNOSIS — Z9882 Breast implant status: Secondary | ICD-10-CM | POA: Diagnosis not present

## 2021-02-20 DIAGNOSIS — Z7982 Long term (current) use of aspirin: Secondary | ICD-10-CM | POA: Diagnosis not present

## 2021-02-20 DIAGNOSIS — Z955 Presence of coronary angioplasty implant and graft: Secondary | ICD-10-CM | POA: Diagnosis not present

## 2021-02-20 DIAGNOSIS — Z171 Estrogen receptor negative status [ER-]: Secondary | ICD-10-CM | POA: Diagnosis not present

## 2021-02-20 MED ORDER — HYDROCODONE-ACETAMINOPHEN 5-325 MG PO TABS: 1.0000 | ORAL_TABLET | Freq: Four times a day (QID) | ORAL | 0 refills | Status: DC | PRN

## 2021-02-20 MED ORDER — BUPIVACAINE-EPINEPHRINE (PF) 0.5% -1:200000 IJ SOLN
INTRAMUSCULAR | Status: DC | PRN
  Administered 2021-02-19: 30 mL via PERINEURAL

## 2021-02-20 NOTE — Progress Notes (Signed)
1 Day Post-Op   Subjective/Chief Complaint: No complaints   Objective: Vital signs in last 24 hours: Temp:  [97 F (36.1 C)-98.3 F (36.8 C)] 97.9 F (36.6 C) (03/17 0630) Pulse Rate:  [39-69] 60 (03/17 0630) Resp:  [7-18] 17 (03/17 0630) BP: (113-156)/(56-70) 113/60 (03/17 0630) SpO2:  [92 %-100 %] 99 % (03/17 0630)    Intake/Output from previous day: 03/16 0701 - 03/17 0700 In: 1800 [P.O.:600; I.V.:1200] Out: 1460 [Urine:1350; Drains:85; Blood:25] Intake/Output this shift: Total I/O In: 300 [P.O.:300] Out: -   General appearance: alert and cooperative Resp: clear to auscultation bilaterally Chest wall: skin flaps look good Cardio: regular rate and rhythm GI: soft, non-tender; bowel sounds normal; no masses,  no organomegaly  Lab Results:  Recent Labs    02/17/21 1356  WBC 7.0  HGB 16.3*  HCT 49.5*  PLT 177   BMET Recent Labs    02/17/21 1356  NA 138  K 3.8  CL 102  CO2 26  GLUCOSE 104*  BUN 13  CREATININE 0.72  CALCIUM 9.6   PT/INR No results for input(s): LABPROT, INR in the last 72 hours. ABG No results for input(s): PHART, HCO3 in the last 72 hours.  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    Anti-infectives: Anti-infectives (From admission, onward)   Start     Dose/Rate Route Frequency Ordered Stop   02/19/21 0645  ceFAZolin (ANCEF) IVPB 2g/100 mL premix        2 g 200 mL/hr over 30 Minutes Intravenous On call to O.R. 02/19/21 0636 02/19/21 0845      Assessment/Plan: s/p Procedure(s) with comments: RIGHT MASTECTOMY (Right) - RNFA, PEC BLOCK SENTINEL LYMPH NODE BIOPSY (Right) Advance diet Discharge  LOS: 0 days    [name] 02/20/2021

## 2021-02-20 NOTE — Anesthesia Procedure Notes (Signed)
Anesthesia Regional Block: Pectoralis block   Pre-Anesthetic Checklist: ,, timeout performed, Correct Patient, Correct Site, Correct Laterality, Correct Procedure, Correct Position, site marked, Risks and benefits discussed,  Surgical consent,  Pre-op evaluation,  At surgeon's request and post-op pain management  Laterality: Right  Prep: chloraprep       Needles:  Injection technique: Single-shot     Needle Length: 9cm  Needle Gauge: 22     Additional Needles: Arrow StimuQuik ECHO Echogenic Stimulating PNB Needle  Procedures:,,,, ultrasound used (permanent image in chart),,,,  Narrative:  Start time: 02/19/2021 8:09 AM End time: 02/19/2021 8:15 AM Injection made incrementally with aspirations every 5 mL.  Performed by: Personally  Anesthesiologist: Oleta Mouse, MD

## 2021-02-20 NOTE — Progress Notes (Signed)
[name] to be D/C'd per MD order. Discussed with the patient and all questions fully answered. ? VSS, Skin clean, dry and intact without evidence of skin break down, no evidence of skin tears noted. ? IV catheter discontinued intact. Site without signs and symptoms of complications. Dressing and pressure applied. ? An After Visit Summary was printed and given to the patient. Patient informed where to pickup prescriptions. ? D/C education completed with patient/family including follow up instructions, medication list, d/c activities limitations if indicated, with other d/c instructions as indicated by MD - patient able to verbalize understanding, all questions fully answered.  ? Patient instructed to return to ED, call 911, or call MD for any changes in condition.  ? Patient to be escorted via [place], and D/C home via private auto.

## 2021-02-20 NOTE — Anesthesia Postprocedure Evaluation (Signed)
Anesthesia Post Note  Patient: Reata Chelsy Parrales  Procedure(s) Performed: RIGHT MASTECTOMY (Right Breast) SENTINEL LYMPH NODE BIOPSY (Right Breast)     Patient location during evaluation: PACU Anesthesia Type: Regional and General Level of consciousness: awake and alert Pain management: pain level controlled Vital Signs Assessment: post-procedure vital signs reviewed and stable Respiratory status: spontaneous breathing, nonlabored ventilation, respiratory function stable and patient connected to nasal cannula oxygen Cardiovascular status: blood pressure returned to baseline and stable Postop Assessment: no apparent nausea or vomiting Anesthetic complications: no   No complications documented.  Last Vitals:  Vitals:   02/20/21 0337 02/20/21 0630  BP: (!) 139/56 113/60  Pulse: (!) 56 60  Resp: 18 17  Temp: 36.6 C 36.6 C  SpO2: 96% 99%    Last Pain:  Vitals:   02/20/21 0630  TempSrc: Oral  PainSc:                  Lekia Nier

## 2021-02-21 LAB — SURGICAL PATHOLOGY

## 2021-02-25 ENCOUNTER — Encounter: Payer: Self-pay | Admitting: *Deleted

## 2021-03-20 DIAGNOSIS — C50911 Malignant neoplasm of unspecified site of right female breast: Secondary | ICD-10-CM | POA: Diagnosis not present

## 2021-04-04 ENCOUNTER — Other Ambulatory Visit: Payer: Self-pay

## 2021-04-04 ENCOUNTER — Ambulatory Visit: Payer: Medicare HMO | Attending: General Surgery | Admitting: Rehabilitation

## 2021-04-04 ENCOUNTER — Encounter: Payer: Self-pay | Admitting: Rehabilitation

## 2021-04-04 DIAGNOSIS — M25611 Stiffness of right shoulder, not elsewhere classified: Secondary | ICD-10-CM

## 2021-04-04 DIAGNOSIS — M5442 Lumbago with sciatica, left side: Secondary | ICD-10-CM | POA: Diagnosis not present

## 2021-04-04 DIAGNOSIS — Z9011 Acquired absence of right breast and nipple: Secondary | ICD-10-CM | POA: Diagnosis not present

## 2021-04-04 NOTE — Patient Instructions (Signed)
Access Code: [email or web address] URL: [email or web address]: 04/29/2022Prepared by: Marcene Brawn TevisExercises  Seated Scapular Retraction - 1 x daily - 7 x weekly - 1-3 sets - 10 reps - 20-30 seconds hold  Shoulder Scaption AAROM with Dowel - 1 x daily - 7 x weekly - 1-3 sets - 10 reps - 20-30 seconds hold  Supine Shoulder Flexion AAROM with Hands Clasped - 1 x daily - 7 x weekly - 1-3 sets - 10 reps - 20-30 seconds hold  Supine Chest Stretch with Elbows Bent - 1 x daily - 7 x weekly - 3 reps - 1 sets - 30-60seconds hold

## 2021-04-04 NOTE — Therapy (Signed)
[place], [place], [address] Phone: [phone]   Fax:  [phone]  Physical Therapy Evaluation  Patient Details  Name: [name] MRN: [id number] Date of Birth: [birth year] Referring Provider (PT): Dr. Marlou Starks   Encounter Date: 04/04/2021   PT End of Session - 04/04/21 1149    Visit Number 1    Number of Visits 4    Date for PT Re-Evaluation 05/30/21    Authorization Type MCR    Progress Note Due on Visit 10    PT Start Time 1100    PT Stop Time 1145    PT Time Calculation (min) 45 min    Activity Tolerance Patient tolerated treatment well;Treatment limited secondary to medical complications (Comment)    Behavior During Therapy [facility] [place] for tasks assessed/performed           Past Medical History:  Diagnosis Date  . Allergic rhinitis   . Breast cancer ([place]) 10/17/2020  . CAD (coronary artery disease)    , s/p CAGB 01/2011- LIMA to LAD patent, all other vein grafts occluded. Circumflex DES 3/13 placed following lateral ischemia on nuclear stress test  . Cervical spondylosis    , bulging  discs  . Disc herniation    L4-L5, L5-S1  . Family history of breast cancer 11/18/2020  . Hypertension   . Mixed hyperlipidemia   . Obesity   . Post concussion syndrome    processes information slowly,   . Postmenopausal   . Prediabetes     Past Surgical History:  Procedure Laterality Date  . Bone Spur Left    Foot  . BREAST BIOPSY    . BREAST ENHANCEMENT SURGERY    . BREAST IMPLANT EXCHANGE     replaced rupture implant  . BREAST LUMPECTOMY WITH RADIOACTIVE SEED LOCALIZATION Right 12/11/2020   Procedure: RIGHT BREAST LUMPECTOMY WITH RADIOACTIVE SEED LOCALIZATION;  Surgeon: Jovita Kussmaul, MD;  Location: [facility];  Service: General;  Laterality: Right;  . COLONOSCOPY    . CORONARY ARTERY BYPASS GRAFT    . Left Bunionectomy    . LEFT HEART CATHETERIZATION WITH CORONARY/GRAFT  ANGIOGRAM  02/05/2012   Procedure: LEFT HEART CATHETERIZATION WITH Beatrix Fetters;  Surgeon: Candee Furbish, MD;  Location: [facility] C[facility] LAB;  Service: Cardiovascular;;  . MASTECTOMY W/ SENTINEL NODE BIOPSY Right 02/19/2021   Procedure: RIGHT MASTECTOMY;  Surgeon: Jovita Kussmaul, MD;  [facility];  Service: General;  Laterality: Right;  RNFA, PEC BLOCK  . PARTIAL HYSTERECTOMY    . RE-EXCISION OF BREAST CANCER,SUPERIOR MARGINS Right 01/15/2021   Procedure: RE-EXCISION RIGHT BREAST MEDIAL MARGIN;  Surgeon: Jovita Kussmaul, MD;  Location: [facility];  Service: General;  Laterality: Right;  . REPLACEMENT TOTAL KNEE Right   . SENTINEL NODE BIOPSY Right 02/19/2021   Procedure: SENTINEL LYMPH NODE BIOPSY;  Surgeon: Jovita Kussmaul, MD;  Location: [facility];  Service: General;  Laterality: Right;  . TONSILLECTOMY    . TUBAL LIGATION Bilateral     There were no vitals filed for this visit.    Subjective Assessment - 04/04/21 1107    Subjective I am pretty good except for the back.    Pertinent History recent fall with post concussive syndrome, current severe sciatic pain, Rt mastectomy with 0/2 LN due to large DCIS with ER/PR negative status on 02/19/21 with Dr Marlou Starks. CAD with CABG in 2012.  1 lymph node removed, lumbar and cervical DDD and herniated discs,  HTN    Limitations Walking;Sitting;Lifting   due to back pain   Patient Stated Goals what can I do for the tightness    Currently in Pain? No/denies   some pain soreness in the chest every day             [facility] PT Assessment - 04/04/21 0001      Assessment   Medical Diagnosis Rt mastectomy    Referring Provider (PT) Dr. Marlou Starks    Onset Date/Surgical Date 02/19/21    Hand Dominance Right    Prior Therapy no      Precautions   Precaution Comments lymphedema risk      Restrictions   Weight Bearing Restrictions No      Balance Screen   Has the patient fallen in the past 6 months No    Has the patient had a decrease in  activity level because of a fear of falling?  No    Is the patient reluctant to leave their home because of a fear of falling?  No      Home Ecologist residence    Living Arrangements Spouse/significant other    Available Help at Discharge Family    Additional Comments spouse reports decreased strength      Prior Function   Level of Independence Independent    Vocation Retired    Leisure not active      Cognition   Overall Cognitive Status Difficult to assess      Observation/Other Assessments   Observations well healed incision  - putting lotion on the incision      ROM / Strength   AROM / PROM / Strength AROM      AROM   AROM Assessment Site Shoulder    Right/Left Shoulder Right;Left    Right Shoulder Flexion 115 Degrees    Right Shoulder ABduction 105 Degrees    Left Shoulder Flexion 117 Degrees    Left Shoulder ABduction 132 Degrees             LYMPHEDEMA/ONCOLOGY QUESTIONNAIRE - 04/04/21 0001      Type   Cancer Type Rt breast cancer      Surgeries   Mastectomy Date 02/19/21    Number Lymph Nodes Removed 2      Treatment   Active Chemotherapy Treatment No    Past Chemotherapy Treatment No    Active Radiation Treatment No    Past Radiation Treatment No    Current Hormone Treatment No    Past Hormone Therapy No      What other symptoms do you have   Are you Having Heaviness or Tightness No      Lymphedema Assessments   Lymphedema Assessments Upper extremities      Right Upper Extremity Lymphedema   15 cm Proximal to Olecranon Process 33.4 cm    Olecranon Process 28 cm    10 cm Proximal to Ulnar Styloid Process 22.8 cm    Just Proximal to Ulnar Styloid Process 16.9 cm    Across Hand at [employer] 19.3 cm    At [place] of 2nd Digit 6.6 cm      Left Upper Extremity Lymphedema   15 cm Proximal to Olecranon Process 33.5 cm    Olecranon Process 28.5 cm    10 cm Proximal to Ulnar Styloid Process 22.3 cm    Just  Proximal to Ulnar Styloid Process 16.3 cm    Across Hand at [employer]  18.8 cm    At [facility] of 2nd Digit 6.4 cm                   Objective measurements completed on examination: See above findings.               PT Education - 04/04/21 1148    Education Details POC, HEP, lymphedema risk reduction    Person(s) Educated Patient;Spouse    Methods Explanation;Demonstration;Handout;Tactile cues;Verbal cues    Comprehension Verbalized understanding;Returned demonstration;Verbal cues required;Need further instruction               PT Long Term Goals - 04/04/21 1159      PT LONG TERM GOAL #1   Title Pt will improve Rt shoulder abduction to within 5 deg of the well arm (135)    Baseline 105    Time 8    Period Weeks    Status New      PT LONG TERM GOAL #2   Title Pt will be ind with HEP for continued strength and mobility    Time 8    Period Weeks    Status New      PT LONG TERM GOAL #3   Title Pt will report ability to carry things given to her from her husband without having to think about it    Baseline has to put things next to her and figure out how to carry them due to decreased confidence in the UE    Time 8    Period Weeks    Status New                  Plan - 04/04/21 1150    Clinical Impression Statement Pt presents close to 4 weeks post Rt lumpectomy and SLNB with most trouble currently coming from moderate to severe Lt LE sciatica and LBP which started 3-4 days ago.  Ptwas educated to call PCP regarding this new pain for possible treatment options / medication. Pt has a well healed incision with some more puckering medially and  no signs of seroma or lymphedema.  Pt has decreased AROM bilaterally but equal into flexion but limited mostly into abduction.  Educated pt on HEP to start as well as started lymphedema education with risk reduction briefly.  Pt will benefit from monitoring every few weeks to check ROM and change HEP as  needed.Pt also currently has cognitive / memory deficits from fall last summer with post concussive syndrome.    Personal Factors and Comorbidities Age;Comorbidity 3+    Comorbidities SLNB, post mastectomy, recent LBP    Examination-Activity Limitations Lift;Stand;Locomotion Level;Carry;Dressing    Examination-Participation Restrictions Cleaning;Meal Prep;Community Activity;Shop    Stability/Clinical Decision Making Stable/Uncomplicated    Clinical Decision Making Low    Rehab Potential Good    PT Frequency 1x / week    PT Duration 8 weeks    PT Treatment/Interventions ADLs/Self Care Home Management;Therapeutic exercise;Patient/family education;Manual lymph drainage;Manual techniques;Passive range of motion;Gait training;Therapeutic activities    PT Next Visit Plan how is back? back status will limit treatment options - recheck AROM, give risk reduction, PROM or AAROM activities as tolerated    PT Home Exercise Plan Access Code: [email or web address]    Consulted and Agree with Plan of Care Patient           Patient will benefit from skilled therapeutic intervention in order to improve the following deficits and impairments:  Decreased range of motion,Decreased strength,Decreased knowledge of precautions,Pain,Postural dysfunction  Visit Diagnosis: Status post mastectomy, right  Stiffness of right shoulder, not elsewhere classified     Problem List Patient Active Problem List   Diagnosis Date Noted  . Genetic testing 12/04/2020  . Family history of breast cancer 11/18/2020  . Erythrocytosis 11/14/2020  . 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 Essential hypertension, benign 10/09/2013  . CAD (coronary artery disease)   . Hypertension   . Mixed hyperlipidemia   . Prediabetes   . Coronary atherosclerosis of native coronary artery 02/05/2012    Stark Bray 04/04/2021, 12:02 PM  [place], [place], [address] Phone: [phone]   Fax:  [phone]  Name: [name] MRN: [id number] Date of Birth: 10-Jul-1946

## 2021-04-08 DIAGNOSIS — G959 Disease of spinal cord, unspecified: Secondary | ICD-10-CM | POA: Diagnosis not present

## 2021-04-09 ENCOUNTER — Other Ambulatory Visit: Payer: Self-pay | Admitting: *Deleted

## 2021-04-09 ENCOUNTER — Other Ambulatory Visit: Payer: Self-pay | Admitting: Family Medicine

## 2021-04-09 DIAGNOSIS — M5442 Lumbago with sciatica, left side: Secondary | ICD-10-CM

## 2021-04-10 ENCOUNTER — Other Ambulatory Visit: Payer: Self-pay | Admitting: Neurosurgery

## 2021-04-10 ENCOUNTER — Telehealth: Payer: Self-pay | Admitting: *Deleted

## 2021-04-10 NOTE — Telephone Encounter (Signed)
   [facility] Pre-operative Risk Assessment    Patient Name: [name]  DOB: [birth year]  MRN: [id number]   HEARTCARE STAFF: - Please ensure there is not already an duplicate clearance open for this procedure. - Under Visit Info/Reason for Call, type in Other and utilize the format Clearance MM/DD/YY or Clearance TBD. Do not use dashes or single digits. - If request is for dental extraction, please clarify the # of teeth to be extracted.  Request for surgical clearance:  1. What type of surgery is being performed? C4-5, C5-6 ANTERIOR CERVICAL FUSION   2. When is this surgery scheduled? 04/23/21   3. What type of clearance is required (medical clearance vs. Pharmacy clearance to hold med vs. Both)? MEDICAL  4. Are there any medications that need to be held prior to surgery and how long? ASA    5. Practice name and name of physician performing surgery? [facility]; DR. GARY CRAM   6. What is the office phone number? [phone]   7.   What is the office fax number? [phone] ATTN: VANESSA x [phone]  8.   Anesthesia type (None, local, MAC, general) ? GENERAL   Julaine Hua 04/10/2021, 2:50 PM  _________________________________________________________________   (provider comments below)

## 2021-04-10 NOTE — Telephone Encounter (Signed)
   Name: [name]  DOB: [birth year]  MRN: [id number]   Primary Cardiologist: Candee Furbish, MD  Chart reviewed as part of pre-operative protocol coverage. Patient was contacted 04/10/2021 in reference to pre-operative risk assessment for pending surgery as outlined below.  [name] was last seen on 10/23/21 by Dr. Marlou Porch.  Since that day, [name] has done fine from a cardiac standpoint. She is limited in activity by weakness though can complete 4 METs without anginal complaints.   Therefore, based on ACC/AHA guidelines, the patient would be at acceptable risk for the planned procedure without further cardiovascular testing.   The patient was advised that if she develops new symptoms prior to surgery to contact our office to arrange for a follow-up visit, and she verbalized understanding.  Per previous recommendation by Dr. Marlou Porch and givne lack of interval change in cardiac history, patient can hold aspirin 7 days prior to his upcoming procedure with plans to restart when cleared to do so by his surgeon.  I will route this recommendation to the requesting party via Epic fax function and remove from pre-op pool. Please call with questions.  Abigail Butts, PA-C 04/10/2021, 4:55 PM

## 2021-04-11 ENCOUNTER — Telehealth: Payer: Self-pay | Admitting: Oncology

## 2021-04-11 ENCOUNTER — Telehealth: Payer: Self-pay | Admitting: Rehabilitation

## 2021-04-11 NOTE — Telephone Encounter (Signed)
Cancelled appt per 5/6 sch msg. Pt declined to r/s.

## 2021-04-11 NOTE — Telephone Encounter (Signed)
Pt will be having a cervical fusion and will have to hold on therapy at this time.

## 2021-04-17 ENCOUNTER — Encounter: Payer: Self-pay | Admitting: Rehabilitation

## 2021-04-17 DIAGNOSIS — M50222 Other cervical disc displacement at C5-C6 level: Secondary | ICD-10-CM | POA: Diagnosis not present

## 2021-04-18 NOTE — Progress Notes (Signed)
Surgical Instructions    Your procedure is scheduled on Wednesday, May 18th, 2022  Report to [facility] Main Entrance "A" at 10:20 A.M., then check in with the Admitting office.  Call this number if you have problems the morning of surgery:  [phone]   If you have any questions prior to your surgery date call [phone]: Open Monday-Friday 8am-4pm    Remember:  Do not eat or drink after midnight the night before your surgery   Take these medicines the morning of surgery with A SIP OF WATER  atenolol (TENORMIN)   If needed:  acetaminophen (TYLENOL)  HYDROcodone-acetaminophen (NORCO/VICODIN)   Kroeger, Daleen Snook, PA-C instructions: Per previous recommendation by Dr. Marlou Porch and givne lack of interval change in cardiac history, patient can hold aspirin 7 days prior to his upcoming procedure with plans to restart when cleared to do so by his surgeon.  As of today, STOP taking Aleve, Naproxen, Ibuprofen, Motrin, Advil, Goody's, BC's, all herbal medications, fish oil, and all vitamins.                     Do not wear jewelry, make up, or nail polish            Do not wear lotions, powders, perfumes, or deodorant.            Do not shave 48 hours prior to surgery.              Do not bring valuables to the hospital.            [facility] is not responsible for any belongings or valuables.  Do NOT Smoke (Tobacco/Vaping) or drink Alcohol 24 hours prior to your procedure If you use a CPAP at night, you may bring all equipment for your overnight stay.   Contacts, glasses, dentures or bridgework may not be worn into surgery, please bring cases for these belongings   For patients admitted to the hospital, discharge time will be determined by your treatment team.   Patients discharged the day of surgery will not be allowed to drive home, and someone needs to stay with them for 24 hours.    Special instructions:   [facility]- Preparing For Surgery  Before surgery, you can play an  important role. Because skin is not sterile, your skin needs to be as free of germs as possible. You can reduce the number of germs on your skin by washing with CHG (chlorahexidine gluconate) Soap before surgery.  CHG is an antiseptic cleaner which kills germs and bonds with the skin to continue killing germs even after washing.    Oral Hygiene is also important to reduce your risk of infection.  Remember - BRUSH YOUR TEETH THE MORNING OF SURGERY WITH YOUR REGULAR TOOTHPASTE  Please do not use if you have an allergy to CHG or antibacterial soaps. If your skin becomes reddened/irritated stop using the CHG.  Do not shave (including legs and underarms) for at least 48 hours prior to first CHG shower. It is OK to shave your face.  Please follow these instructions carefully.   1. Shower the NIGHT BEFORE SURGERY and the MORNING OF SURGERY  2. If you chose to wash your hair, wash your hair first as usual with your normal shampoo.  3. After you shampoo, rinse your hair and body thoroughly to remove the shampoo.  4. Use CHG Soap as you would any other liquid soap. You can apply CHG directly to the skin and  wash gently with a scrungie or a clean washcloth.   5. Apply the CHG Soap to your body ONLY FROM THE NECK DOWN.  Do not use on open wounds or open sores. Avoid contact with your eyes, ears, mouth and genitals (private parts). Wash Face and genitals (private parts)  with your normal soap.   6. Wash thoroughly, paying special attention to the area where your surgery will be performed.  7. Thoroughly rinse your body with warm water from the neck down.  8. DO NOT shower/wash with your normal soap after using and rinsing off the CHG Soap.  9. Pat yourself dry with a CLEAN TOWEL.  10. Wear CLEAN PAJAMAS to bed the night before surgery  11. Place CLEAN SHEETS on your bed the night before your surgery  12. DO NOT SLEEP WITH PETS.   Day of Surgery: Take a shower with CHG soap. Wear  Clean/Comfortable clothing the morning of surgery Do not apply any deodorants/lotions.   Remember to brush your teeth WITH YOUR REGULAR TOOTHPASTE.   Please read over the following fact sheets that you were given.

## 2021-04-21 ENCOUNTER — Encounter (HOSPITAL_COMMUNITY)
Admission: RE | Admit: 2021-04-21 | Discharge: 2021-04-21 | Disposition: A | Discharge status: Home / Self Care | Payer: Medicare HMO | Source: Ambulatory Visit | Attending: Neurosurgery | Admitting: Neurosurgery

## 2021-04-21 ENCOUNTER — Encounter (HOSPITAL_COMMUNITY): Payer: Self-pay

## 2021-04-21 ENCOUNTER — Other Ambulatory Visit: Payer: Self-pay

## 2021-04-21 DIAGNOSIS — Z951 Presence of aortocoronary bypass graft: Secondary | ICD-10-CM | POA: Diagnosis not present

## 2021-04-21 DIAGNOSIS — I2510 Atherosclerotic heart disease of native coronary artery without angina pectoris: Secondary | ICD-10-CM | POA: Insufficient documentation

## 2021-04-21 DIAGNOSIS — Z01812 Encounter for preprocedural laboratory examination: Secondary | ICD-10-CM | POA: Insufficient documentation

## 2021-04-21 DIAGNOSIS — Z20822 Contact with and (suspected) exposure to covid-19: Secondary | ICD-10-CM | POA: Diagnosis not present

## 2021-04-21 DIAGNOSIS — Z9011 Acquired absence of right breast and nipple: Secondary | ICD-10-CM | POA: Diagnosis not present

## 2021-04-21 LAB — BASIC METABOLIC PANEL
Anion gap: 10 (ref 5–15)
BUN: 19 mg/dL (ref 8–23)
CO2: 26 mmol/L (ref 22–32)
Calcium: 9.6 mg/dL (ref 8.9–10.3)
Chloride: 103 mmol/L (ref 98–111)
Creatinine, Ser: 0.61 mg/dL (ref 0.44–1.00)
GFR, Estimated: >60 mL/min (ref >60)
Glucose, Bld: 106 mg/dL — ABNORMAL HIGH (ref 70–99)
Potassium: 3.5 mmol/L (ref 3.5–5.1)
Sodium: 139 mmol/L (ref 135–145)

## 2021-04-21 LAB — TYPE AND SCREEN
ABO/RH(D): A POS
Antibody Screen: NEGATIVE

## 2021-04-21 LAB — CBC
HCT: 46.2 % — ABNORMAL HIGH (ref 36.0–46.0)
Hemoglobin: 14.9 g/dL (ref 12.0–15.0)
MCH: 31.4 pg (ref 26.0–34.0)
MCHC: 32.3 g/dL (ref 30.0–36.0)
MCV: 97.3 fL (ref 80.0–100.0)
Platelets: 210 10*3/uL (ref 150–400)
RBC: 4.75 MIL/uL (ref 3.87–5.11)
RDW: 12.5 % (ref 11.5–15.5)
WBC: 9 10*3/uL (ref 4.0–10.5)
nRBC: 0 % (ref 0.0–0.2)

## 2021-04-21 LAB — SURGICAL PCR SCREEN
MRSA, PCR: NEGATIVE
Staphylococcus aureus: NEGATIVE

## 2021-04-21 NOTE — Progress Notes (Signed)
PCP - Orpah Melter, MD Cardiologist -   PPM/ICD - denies Device Orders - N/A Rep Notified - N/A  Chest x-ray - N/A EKG - 10/23/2020 Stress Test - 2013 ECHO - 12/19/2015 Cardiac Cath - 2013  Sleep Study - denies CPAP - N/A  Fasting Blood Sugar - N/A  Blood Thinner Instructions: N/A Aspirin Instructions: Per patient, last dose of Aspirin was on 04/16/2021  Patient was instructed: As of today, STOP taking Aleve, Naproxen, Ibuprofen, Motrin, Advil, Goody's, BC's, all herbal medications, fish oil, and all vitamins.  ERAS Protcol - No  COVID TEST- 04/21/2021   Anesthesia review: Yes; cardiac history  Patient denies shortness of breath, fever, cough and chest pain at PAT appointment   All instructions explained to the patient, with a verbal understanding of the material. Patient agrees to go over the instructions while at home for a better understanding. Patient also instructed to self quarantine after being tested for COVID-19. The opportunity to ask questions was provided.

## 2021-04-22 LAB — SARS CORONAVIRUS 2 (TAT 6-24 HRS): SARS Coronavirus 2: NEGATIVE

## 2021-04-22 NOTE — Anesthesia Preprocedure Evaluation (Addendum)
Anesthesia Evaluation  Patient identified by MRN, date of birth, ID band Patient awake    Reviewed: Allergy & Precautions, NPO status , Patient's Chart, lab work & pertinent test results  History of Anesthesia Complications Negative for: history of anesthetic complications  Airway Mallampati: III  TM Distance: >3 FB Neck ROM: Full    Dental  (+) Teeth Intact, Dental Advisory Given   Pulmonary neg shortness of breath, neg sleep apnea, neg COPD, neg recent URI,  Covid-19 Nucleic Acid Test Results Lab Results      Component                Value               Date                      [place]              NEGATIVE            04/21/2021                [place]              NEGATIVE            02/15/2021                [place]              NEGATIVE            01/11/2021                [place]              NEGATIVE            12/09/2020              breath sounds clear to auscultation       Cardiovascular hypertension, Pt. on medications and Pt. on home beta blockers + CAD and + CABG   Rhythm:Regular  Left ventricle: The cavity size was normal. Wall thickness was  normal. Systolic function was normal. The estimated ejection  fraction was in the range of 55% to 60%. Wall motion was normal;  there were no regional wall motion abnormalities. Features are  consistent with a pseudonormal left ventricular filling pattern,  with concomitant abnormal relaxation and increased filling  pressure (grade 2 diastolic dysfunction). Doppler parameters are  consistent with high ventricular filling pressure.  - Left atrium: The atrium was mildly dilated.  - Pulmonary arteries: Systolic pressure was mildly increased.    Neuro/Psych PSYCHIATRIC DISORDERS Dementia  Neuromuscular disease    GI/Hepatic negative GI ROS, Neg liver ROS,   Endo/Other  negative endocrine ROS  Renal/GU Lab Results      Component                 Value               Date                      CREATININE               0.61                04/21/2021           Lab Results      Component                Value  Date                      K                        3.5                 04/21/2021                Musculoskeletal  (+) Arthritis ,   Abdominal   Peds  Hematology negative hematology ROS (+) Lab Results      Component                Value               Date                      WBC                      9.0                 04/21/2021                HGB                      14.9                04/21/2021                HCT                      46.2 (H)            04/21/2021                MCV                      97.3                04/21/2021                PLT                      210                 04/21/2021              Anesthesia Other Findings Follows with cardiology for history of CAD s/p CABG in 2012.  Cardiac cath 2013 showed vein grafts were occluded, LIMA to LAD was patent, DES was placed in circumflex.  Last seen by Dr. Marlou Porch 10/23/2020 at that time noted to be doing well from a cardiac standpoint, able to complete greater than 4 METS of activity, and was cleared for her upcoming right-sided breast cancer surgery.  She did subsequently undergo right breast lumpectomy on 12/11/2020 followed by reexcision of right breast medial margin on 01/15/2021, and then ultimately a right mastectomy on 02/20/2021.  She did obtain cardiac clearance for this procedure as well.  Per telephone encounter 04/10/2021, "Chart reviewed as part of pre-operative protocol coverage. Patient was contacted5/5/2022in reference to pre-operative risk assessment for pending surgery as outlined below. Malaysha Nicole Kindred last seen on 11/17/22by Dr. Marlou Porch. Since that day, Kazaria Janashia Parco done fine from a cardiac standpoint.She is limited in activity by weakness though can complete 4 METs without anginal complaints. Therefore,  based on ACC/AHA  guidelines, the patient would be at acceptable risk for the planned procedure without further cardiovascular testing. The patient was advised that ifshedevelops new symptoms prior to surgery to contact our office to arrange for a follow-up visit, and sheverbalized understanding. Per previous recommendation by Dr. Marlou Porch and given lack of interval change in cardiac history, patient can hold aspirin 7 days prior to his upcoming procedure with plans to restart when cleared to do so by hissurgeon."  Preop labs reviewed, unremarkable.  EKG 09/22/20: Sinus rhythm. Rate 68.  TTE 12/09/2015: - Left ventricle: The cavity size was normal. Wall thickness was  normal. Systolic function was normal. The estimated ejection  fraction was in the range of 55% to 60%. Wall motion was normal;  there were no regional wall motion abnormalities. Features are  consistent with a pseudonormal left ventricular filling pattern,  with concomitant abnormal relaxation and increased filling  pressure (grade 2 diastolic dysfunction). Doppler parameters are  consistent with high ventricular filling pressure.  - Left atrium: The atrium was mildly dilated.  - Pulmonary arteries: Systolic pressure was mildly increased.   Impressions:   - Normal LV systolic function; grade 2 diastolic dysfunction with  elevated LV filling pressure; mild LAE; mild TR with mildly  elevated pulmonary pressure.    Reproductive/Obstetrics                           Anesthesia Physical Anesthesia Plan  ASA: III  Anesthesia Plan: General   Post-op Pain Management:    Induction: Intravenous  PONV Risk Score and Plan: 3 and Ondansetron, Dexamethasone, Propofol infusion and TIVA  Airway Management Planned: Oral ETT  Additional Equipment: None  Intra-op Plan:   Post-operative Plan: Extubation in OR  Informed Consent: I have reviewed the patients History and Physical, chart,  labs and discussed the procedure including the risks, benefits and alternatives for the proposed anesthesia with the patient or authorized representative who has indicated his/her understanding and acceptance.     Dental advisory given  Plan Discussed with: CRNA and Surgeon  Anesthesia Plan Comments: (PAT note by Karoline Caldwell, PA-C: Follows with cardiology for history of CAD s/p CABG in 2012.  Cardiac cath 2013 showed vein grafts were occluded, LIMA to LAD was patent, DES was placed in circumflex.  Last seen by Dr. Marlou Porch 10/23/2020 at that time noted to be doing well from a cardiac standpoint, able to complete greater than 4 METS of activity, and was cleared for her upcoming right-sided breast cancer surgery.  She did subsequently undergo right breast lumpectomy on 12/11/2020 followed by reexcision of right breast medial margin on 01/15/2021, and then ultimately a right mastectomy on 02/20/2021.  She did obtain cardiac clearance for this procedure as well.  Per telephone encounter 04/10/2021, "Chart reviewed as part of pre-operative protocol coverage. Patient was contacted 04/10/2021 in reference to pre-operative risk assessment for pending surgery as outlined below.  Gara Natilie Krabbenhoft was last seen on 10/23/21 by Dr. Marlou Porch.  Since that day, Shaylie Taia Bramlett has done fine from a cardiac standpoint. She is limited in activity by weakness though can complete 4 METs without anginal complaints.  Therefore, based on ACC/AHA guidelines, the patient would be at acceptable risk for the planned procedure without further cardiovascular testing. The patient was advised that if she develops new symptoms prior to surgery to contact our office to arrange for a follow-up visit, and she verbalized understanding. Per previous recommendation by Dr. Marlou Porch and given  lack of interval change in cardiac history, patient can hold aspirin 7 days prior to his upcoming procedure with plans to restart when cleared to do so by his  surgeon."  Preop labs reviewed, unremarkable.  EKG 09/22/20: Sinus rhythm. Rate 68.  TTE 12/09/2015: - Left ventricle: The cavity size was normal. Wall thickness was  normal. Systolic function was normal. The estimated ejection  fraction was in the range of 55% to 60%. Wall motion was normal;  there were no regional wall motion abnormalities. Features are  consistent with a pseudonormal left ventricular filling pattern,  with concomitant abnormal relaxation and increased filling  pressure (grade 2 diastolic dysfunction). Doppler parameters are  consistent with high ventricular filling pressure.  - Left atrium: The atrium was mildly dilated.  - Pulmonary arteries: Systolic pressure was mildly increased.   Impressions:   - Normal LV systolic function; grade 2 diastolic dysfunction with  elevated LV filling pressure; mild LAE; mild TR with mildly  elevated pulmonary pressure.  )       Anesthesia Quick Evaluation

## 2021-04-22 NOTE — Progress Notes (Signed)
Anesthesia Chart Review:  Follows with cardiology for history of CAD s/p CABG in 2012.  Cardiac cath 2013 showed vein grafts were occluded, LIMA to LAD was patent, DES was placed in circumflex.  Last seen by Dr. Marlou Porch 10/23/2020 at that time noted to be doing well from a cardiac standpoint, able to complete greater than 4 METS of activity, and was cleared for her upcoming right-sided breast cancer surgery.  She did subsequently undergo right breast lumpectomy on 12/11/2020 followed by reexcision of right breast medial margin on 01/15/2021, and then ultimately a right mastectomy on 02/20/2021.  She did obtain cardiac clearance for this procedure as well.  Per telephone encounter 04/10/2021, "Chart reviewed as part of pre-operative protocol coverage. Patient was contacted 04/10/2021 in reference to pre-operative risk assessment for pending surgery as outlined below.  [name] was last seen on 10/23/21 by Dr. Marlou Porch.  Since that day, [name] has done fine from a cardiac standpoint. She is limited in activity by weakness though can complete 4 METs without anginal complaints.  Therefore, based on ACC/AHA guidelines, the patient would be at acceptable risk for the planned procedure without further cardiovascular testing. The patient was advised that if she develops new symptoms prior to surgery to contact our office to arrange for a follow-up visit, and she verbalized understanding. Per previous recommendation by Dr. Marlou Porch and given lack of interval change in cardiac history, patient can hold aspirin 7 days prior to his upcoming procedure with plans to restart when cleared to do so by his surgeon."  Preop labs reviewed, unremarkable.  EKG 09/22/20: Sinus rhythm. Rate 68.  TTE 12/09/2015: - Left ventricle: The cavity size was normal. Wall thickness was  normal. Systolic function was normal. The estimated ejection  fraction was in the range of 55% to 60%. Wall motion was normal;  there were  no regional wall motion abnormalities. Features are  consistent with a pseudonormal left ventricular filling pattern,  with concomitant abnormal relaxation and increased filling  pressure (grade 2 diastolic dysfunction). Doppler parameters are  consistent with high ventricular filling pressure.  - Left atrium: The atrium was mildly dilated.  - Pulmonary arteries: Systolic pressure was mildly increased.   Impressions:   - Normal LV systolic function; grade 2 diastolic dysfunction with  elevated LV filling pressure; mild LAE; mild TR with mildly  elevated pulmonary pressure.    [name] [facility] Short Stay Center/Anesthesiology Phone [phone] 04/22/2021 9:31 AM

## 2021-04-23 ENCOUNTER — Encounter (HOSPITAL_COMMUNITY)
Admission: RE | Disposition: A | Discharge status: Home / Self Care | Payer: Self-pay | Source: Home / Self Care | Attending: Neurosurgery

## 2021-04-23 ENCOUNTER — Other Ambulatory Visit: Payer: Self-pay

## 2021-04-23 ENCOUNTER — Ambulatory Visit (HOSPITAL_COMMUNITY): Payer: Medicare HMO

## 2021-04-23 ENCOUNTER — Encounter (HOSPITAL_COMMUNITY): Payer: Self-pay | Admitting: Neurosurgery

## 2021-04-23 ENCOUNTER — Observation Stay (HOSPITAL_COMMUNITY)
Admission: RE | Admit: 2021-04-23 | Discharge: 2021-04-25 | Disposition: A | Discharge status: Home / Self Care | Payer: Medicare HMO | Attending: Neurosurgery | Admitting: Neurosurgery

## 2021-04-23 ENCOUNTER — Ambulatory Visit (HOSPITAL_COMMUNITY): Payer: Medicare HMO | Admitting: Anesthesiology

## 2021-04-23 ENCOUNTER — Ambulatory Visit (HOSPITAL_COMMUNITY): Payer: Medicare HMO | Admitting: Physician Assistant

## 2021-04-23 DIAGNOSIS — I2583 Coronary atherosclerosis due to lipid rich plaque: Secondary | ICD-10-CM

## 2021-04-23 DIAGNOSIS — I10 Essential (primary) hypertension: Secondary | ICD-10-CM

## 2021-04-23 DIAGNOSIS — Z853 Personal history of malignant neoplasm of breast: Secondary | ICD-10-CM | POA: Insufficient documentation

## 2021-04-23 DIAGNOSIS — Z7982 Long term (current) use of aspirin: Secondary | ICD-10-CM | POA: Insufficient documentation

## 2021-04-23 DIAGNOSIS — I4892 Unspecified atrial flutter: Secondary | ICD-10-CM

## 2021-04-23 DIAGNOSIS — E785 Hyperlipidemia, unspecified: Secondary | ICD-10-CM | POA: Diagnosis not present

## 2021-04-23 DIAGNOSIS — Z981 Arthrodesis status: Secondary | ICD-10-CM | POA: Diagnosis not present

## 2021-04-23 DIAGNOSIS — Z96651 Presence of right artificial knee joint: Secondary | ICD-10-CM | POA: Diagnosis not present

## 2021-04-23 DIAGNOSIS — M4712 Other spondylosis with myelopathy, cervical region: Principal | ICD-10-CM | POA: Insufficient documentation

## 2021-04-23 DIAGNOSIS — Z91040 Latex allergy status: Secondary | ICD-10-CM | POA: Diagnosis not present

## 2021-04-23 DIAGNOSIS — Z79899 Other long term (current) drug therapy: Secondary | ICD-10-CM | POA: Diagnosis not present

## 2021-04-23 DIAGNOSIS — R7303 Prediabetes: Secondary | ICD-10-CM | POA: Insufficient documentation

## 2021-04-23 DIAGNOSIS — I2510 Atherosclerotic heart disease of native coronary artery without angina pectoris: Secondary | ICD-10-CM | POA: Diagnosis not present

## 2021-04-23 DIAGNOSIS — Z4889 Encounter for other specified surgical aftercare: Secondary | ICD-10-CM | POA: Diagnosis not present

## 2021-04-23 DIAGNOSIS — Z951 Presence of aortocoronary bypass graft: Secondary | ICD-10-CM | POA: Insufficient documentation

## 2021-04-23 DIAGNOSIS — G959 Disease of spinal cord, unspecified: Secondary | ICD-10-CM | POA: Diagnosis present

## 2021-04-23 DIAGNOSIS — M4802 Spinal stenosis, cervical region: Secondary | ICD-10-CM | POA: Diagnosis not present

## 2021-04-23 DIAGNOSIS — Z419 Encounter for procedure for purposes other than remedying health state, unspecified: Secondary | ICD-10-CM

## 2021-04-23 DIAGNOSIS — I4819 Other persistent atrial fibrillation: Secondary | ICD-10-CM | POA: Diagnosis not present

## 2021-04-23 HISTORY — PX: ANTERIOR CERVICAL DECOMP/DISCECTOMY FUSION: SHX1161

## 2021-04-23 HISTORY — DX: Disease of spinal cord, unspecified: G95.9

## 2021-04-23 LAB — BASIC METABOLIC PANEL
Anion gap: 9 (ref 5–15)
BUN: 12 mg/dL (ref 8–23)
CO2: 26 mmol/L (ref 22–32)
Calcium: 9.2 mg/dL (ref 8.9–10.3)
Chloride: 101 mmol/L (ref 98–111)
Creatinine, Ser: 0.67 mg/dL (ref 0.44–1.00)
GFR, Estimated: >60 mL/min (ref >60)
Glucose, Bld: 154 mg/dL — ABNORMAL HIGH (ref 70–99)
Potassium: 3.8 mmol/L (ref 3.5–5.1)
Sodium: 136 mmol/L (ref 135–145)

## 2021-04-23 LAB — TSH: TSH: 0.689 u[IU]/mL (ref 0.350–4.500)

## 2021-04-23 LAB — CBC
HCT: 46.2 % — ABNORMAL HIGH (ref 36.0–46.0)
Hemoglobin: 15.3 g/dL — ABNORMAL HIGH (ref 12.0–15.0)
MCH: 32.2 pg (ref 26.0–34.0)
MCHC: 33.1 g/dL (ref 30.0–36.0)
MCV: 97.3 fL (ref 80.0–100.0)
Platelets: 157 10*3/uL (ref 150–400)
RBC: 4.75 MIL/uL (ref 3.87–5.11)
RDW: 12.1 % (ref 11.5–15.5)
WBC: 12.4 10*3/uL — ABNORMAL HIGH (ref 4.0–10.5)
nRBC: 0 % (ref 0.0–0.2)

## 2021-04-23 LAB — MAGNESIUM: Magnesium: 1.9 mg/dL (ref 1.7–2.4)

## 2021-04-23 SURGERY — ANTERIOR CERVICAL DECOMPRESSION/DISCECTOMY FUSION 2 LEVELS
Anesthesia: General

## 2021-04-23 MED ORDER — ACETAMINOPHEN 500 MG PO TABS: 1000.0000 mg | ORAL_TABLET | Freq: Once | ORAL | Status: DC | PRN

## 2021-04-23 MED ORDER — OXYCODONE HCL 5 MG PO TABS
10.0000 mg | ORAL_TABLET | ORAL | Status: DC | PRN
  Administered 2021-04-24: 10 mg via ORAL
  Filled 2021-04-23 (×2): qty 2

## 2021-04-23 MED ORDER — FENTANYL CITRATE (PF) 100 MCG/2ML IJ SOLN
25.0000 ug | INTRAMUSCULAR | Status: DC | PRN
  Administered 2021-04-23: 50 ug via INTRAVENOUS

## 2021-04-23 MED ORDER — ACETAMINOPHEN 500 MG PO TABS: 500.0000 mg | ORAL_TABLET | Freq: Four times a day (QID) | ORAL | Status: DC | PRN

## 2021-04-23 MED ORDER — ATENOLOL 50 MG PO TABS
50.0000 mg | ORAL_TABLET | Freq: Every day | ORAL | Status: DC
  Administered 2021-04-24 – 2021-04-25 (×2): 50 mg via ORAL
  Filled 2021-04-23: qty 1
  Filled 2021-04-23: qty 2
  Filled 2021-04-23: qty 1
  Filled 2021-04-23: qty 2

## 2021-04-23 MED ORDER — ORAL CARE MOUTH RINSE: 15.0000 mL | Freq: Once | OROMUCOSAL | Status: AC

## 2021-04-23 MED ORDER — MENTHOL 3 MG MT LOZG: 1.0000 | LOZENGE | OROMUCOSAL | Status: DC | PRN

## 2021-04-23 MED ORDER — LACTATED RINGERS IV SOLN: INTRAVENOUS | Status: DC

## 2021-04-23 MED ORDER — MIDAZOLAM HCL 5 MG/5ML IJ SOLN
INTRAMUSCULAR | Status: DC | PRN
  Administered 2021-04-23 (×2): 1 mg via INTRAVENOUS

## 2021-04-23 MED ORDER — ROSUVASTATIN CALCIUM 20 MG PO TABS
20.0000 mg | ORAL_TABLET | Freq: Every day | ORAL | Status: DC
  Administered 2021-04-23 – 2021-04-24 (×2): 20 mg via ORAL
  Filled 2021-04-23 (×2): qty 1

## 2021-04-23 MED ORDER — ROCURONIUM BROMIDE 100 MG/10ML IV SOLN
INTRAVENOUS | Status: DC | PRN
  Administered 2021-04-23: 30 mg via INTRAVENOUS
  Administered 2021-04-23: 20 mg via INTRAVENOUS
  Administered 2021-04-23: 70 mg via INTRAVENOUS

## 2021-04-23 MED ORDER — OXYCODONE HCL 5 MG PO TABS: 5.0000 mg | ORAL_TABLET | Freq: Once | ORAL | Status: DC | PRN

## 2021-04-23 MED ORDER — ACETAMINOPHEN 10 MG/ML IV SOLN
INTRAVENOUS | Status: AC
  Filled 2021-04-23: qty 100

## 2021-04-23 MED ORDER — PHENOL 1.4 % MT LIQD: 1.0000 | OROMUCOSAL | Status: DC | PRN

## 2021-04-23 MED ORDER — POTASSIUM GLUCONATE 550 (90 K) MG PO TABS: 550.0000 mg | ORAL_TABLET | Freq: Every evening | ORAL | Status: DC

## 2021-04-23 MED ORDER — SODIUM CHLORIDE 0.9% FLUSH
3.0000 mL | Freq: Two times a day (BID) | INTRAVENOUS | Status: DC
  Administered 2021-04-23 – 2021-04-25 (×3): 3 mL via INTRAVENOUS

## 2021-04-23 MED ORDER — VANCOMYCIN HCL 1000 MG/200ML IV SOLN
1000.0000 mg | Freq: Once | INTRAVENOUS | Status: AC
  Administered 2021-04-23: 1000 mg via INTRAVENOUS
  Filled 2021-04-23: qty 200

## 2021-04-23 MED ORDER — PROPOFOL 500 MG/50ML IV EMUL
INTRAVENOUS | Status: DC | PRN
  Administered 2021-04-23: 50 ug/kg/min via INTRAVENOUS

## 2021-04-23 MED ORDER — EPHEDRINE SULFATE 50 MG/ML IJ SOLN
INTRAMUSCULAR | Status: DC | PRN
  Administered 2021-04-23 (×2): 10 mg via INTRAVENOUS

## 2021-04-23 MED ORDER — ACETAMINOPHEN 10 MG/ML IV SOLN: 1000.0000 mg | Freq: Once | INTRAVENOUS | Status: DC | PRN

## 2021-04-23 MED ORDER — ASPIRIN EC 81 MG PO TBEC
81.0000 mg | DELAYED_RELEASE_TABLET | Freq: Every day | ORAL | Status: DC
  Administered 2021-04-24 – 2021-04-25 (×2): 81 mg via ORAL
  Filled 2021-04-23 (×2): qty 1

## 2021-04-23 MED ORDER — SODIUM CHLORIDE 0.9 % IV SOLN: 250.0000 mL | INTRAVENOUS | Status: DC

## 2021-04-23 MED ORDER — ACETAMINOPHEN 160 MG/5ML PO SOLN: 1000.0000 mg | Freq: Once | ORAL | Status: DC | PRN

## 2021-04-23 MED ORDER — ACETAMINOPHEN 10 MG/ML IV SOLN
INTRAVENOUS | Status: DC | PRN
  Administered 2021-04-23: 1000 mg via INTRAVENOUS

## 2021-04-23 MED ORDER — CHLORHEXIDINE GLUCONATE CLOTH 2 % EX PADS: 6.0000 | MEDICATED_PAD | Freq: Once | CUTANEOUS | Status: DC

## 2021-04-23 MED ORDER — POTASSIUM CHLORIDE 20 MEQ/15ML (10%) PO SOLN
2.3500 meq | Freq: Every day | ORAL | Status: DC
  Administered 2021-04-23 – 2021-04-24 (×2): 2.4 meq via ORAL
  Filled 2021-04-23: qty 1.8
  Filled 2021-04-23 (×3): qty 15

## 2021-04-23 MED ORDER — LIDOCAINE HCL (CARDIAC) PF 100 MG/5ML IV SOSY
PREFILLED_SYRINGE | INTRAVENOUS | Status: DC | PRN
  Administered 2021-04-23: 60 mg via INTRAVENOUS

## 2021-04-23 MED ORDER — DEXAMETHASONE SODIUM PHOSPHATE 10 MG/ML IJ SOLN
10.0000 mg | Freq: Once | INTRAMUSCULAR | Status: AC
  Administered 2021-04-23: 10 mg via INTRAVENOUS

## 2021-04-23 MED ORDER — CYCLOBENZAPRINE HCL 10 MG PO TABS
10.0000 mg | ORAL_TABLET | Freq: Three times a day (TID) | ORAL | Status: DC | PRN
  Filled 2021-04-23 (×2): qty 1

## 2021-04-23 MED ORDER — ONDANSETRON HCL 4 MG PO TABS: 4.0000 mg | ORAL_TABLET | Freq: Four times a day (QID) | ORAL | Status: DC | PRN

## 2021-04-23 MED ORDER — FENTANYL CITRATE (PF) 100 MCG/2ML IJ SOLN
INTRAMUSCULAR | Status: DC | PRN
  Administered 2021-04-23: 100 ug via INTRAVENOUS
  Administered 2021-04-23: 50 ug via INTRAVENOUS
  Administered 2021-04-23: 100 ug via INTRAVENOUS

## 2021-04-23 MED ORDER — SODIUM CHLORIDE 0.9% FLUSH
3.0000 mL | INTRAVENOUS | Status: DC | PRN
  Administered 2021-04-24: 3 mL via INTRAVENOUS

## 2021-04-23 MED ORDER — ATENOLOL 25 MG PO TABS: 50.0000 mg | ORAL_TABLET | Freq: Every day | ORAL | Status: DC

## 2021-04-23 MED ORDER — ADULT MULTIVITAMIN W/MINERALS CH
1.0000 | ORAL_TABLET | Freq: Every day | ORAL | Status: DC
  Administered 2021-04-24 – 2021-04-25 (×2): 1 via ORAL
  Filled 2021-04-23 (×2): qty 1

## 2021-04-23 MED ORDER — EPHEDRINE 5 MG/ML INJ
INTRAVENOUS | Status: AC
  Filled 2021-04-23: qty 10

## 2021-04-23 MED ORDER — OXYCODONE HCL 5 MG/5ML PO SOLN: 5.0000 mg | Freq: Once | ORAL | Status: DC | PRN

## 2021-04-23 MED ORDER — VANCOMYCIN HCL IN DEXTROSE 1-5 GM/200ML-% IV SOLN
INTRAVENOUS | Status: AC
  Administered 2021-04-23: 1000 mg via INTRAVENOUS
  Filled 2021-04-23: qty 200

## 2021-04-23 MED ORDER — FENTANYL CITRATE (PF) 250 MCG/5ML IJ SOLN
INTRAMUSCULAR | Status: AC
  Filled 2021-04-23: qty 5

## 2021-04-23 MED ORDER — HYDROMORPHONE HCL 1 MG/ML IJ SOLN: 0.5000 mg | INTRAMUSCULAR | Status: DC | PRN

## 2021-04-23 MED ORDER — FENTANYL CITRATE (PF) 100 MCG/2ML IJ SOLN
INTRAMUSCULAR | Status: AC
  Filled 2021-04-23: qty 2

## 2021-04-23 MED ORDER — 0.9 % SODIUM CHLORIDE (POUR BTL) OPTIME
TOPICAL | Status: DC | PRN
  Administered 2021-04-23: 1000 mL

## 2021-04-23 MED ORDER — PANTOPRAZOLE SODIUM 40 MG IV SOLR
40.0000 mg | Freq: Every day | INTRAVENOUS | Status: DC
  Administered 2021-04-23: 40 mg via INTRAVENOUS
  Filled 2021-04-23: qty 40

## 2021-04-23 MED ORDER — THROMBIN 5000 UNITS EX SOLR
CUTANEOUS | Status: AC
  Filled 2021-04-23: qty 15000

## 2021-04-23 MED ORDER — THROMBIN 5000 UNITS EX SOLR
CUTANEOUS | Status: DC | PRN
  Administered 2021-04-23: 10000 [IU] via TOPICAL

## 2021-04-23 MED ORDER — ROCURONIUM BROMIDE 10 MG/ML (PF) SYRINGE
PREFILLED_SYRINGE | INTRAVENOUS | Status: AC
  Filled 2021-04-23: qty 20

## 2021-04-23 MED ORDER — ACETAMINOPHEN 650 MG RE SUPP: 650.0000 mg | RECTAL | Status: DC | PRN

## 2021-04-23 MED ORDER — DEXAMETHASONE SODIUM PHOSPHATE 10 MG/ML IJ SOLN
INTRAMUSCULAR | Status: AC
  Filled 2021-04-23: qty 2

## 2021-04-23 MED ORDER — FUROSEMIDE 20 MG PO TABS
20.0000 mg | ORAL_TABLET | Freq: Every day | ORAL | Status: DC
  Administered 2021-04-24: 20 mg via ORAL
  Filled 2021-04-23: qty 1

## 2021-04-23 MED ORDER — ACETAMINOPHEN 325 MG PO TABS: 650.0000 mg | ORAL_TABLET | ORAL | Status: DC | PRN

## 2021-04-23 MED ORDER — VANCOMYCIN HCL IN DEXTROSE 1-5 GM/200ML-% IV SOLN: 1000.0000 mg | INTRAVENOUS | Status: AC

## 2021-04-23 MED ORDER — HEMOSTATIC AGENTS (NO CHARGE) OPTIME
TOPICAL | Status: DC | PRN
  Administered 2021-04-23: 1 via TOPICAL

## 2021-04-23 MED ORDER — ONDANSETRON HCL 4 MG/2ML IJ SOLN: 4.0000 mg | Freq: Four times a day (QID) | INTRAMUSCULAR | Status: DC | PRN

## 2021-04-23 MED ORDER — PHENYLEPHRINE HCL-NACL 10-0.9 MG/250ML-% IV SOLN
INTRAVENOUS | Status: DC | PRN
  Administered 2021-04-23: 50 ug/min via INTRAVENOUS

## 2021-04-23 MED ORDER — B COMPLEX-C PO TABS
1.0000 | ORAL_TABLET | Freq: Every evening | ORAL | Status: DC
  Administered 2021-04-23 – 2021-04-24 (×2): 1 via ORAL
  Filled 2021-04-23 (×2): qty 1

## 2021-04-23 MED ORDER — ROSUVASTATIN CALCIUM 20 MG PO TABS: 20.0000 mg | ORAL_TABLET | Freq: Every evening | ORAL | Status: DC

## 2021-04-23 MED ORDER — SUGAMMADEX SODIUM 500 MG/5ML IV SOLN
INTRAVENOUS | Status: DC | PRN
  Administered 2021-04-23: 300 mg via INTRAVENOUS

## 2021-04-23 MED ORDER — CHLORHEXIDINE GLUCONATE 0.12 % MT SOLN
OROMUCOSAL | Status: AC
  Administered 2021-04-23: 15 mL via OROMUCOSAL
  Filled 2021-04-23: qty 15

## 2021-04-23 MED ORDER — MIDAZOLAM HCL 2 MG/2ML IJ SOLN
INTRAMUSCULAR | Status: AC
  Filled 2021-04-23: qty 2

## 2021-04-23 MED ORDER — THROMBIN 5000 UNITS EX SOLR
OROMUCOSAL | Status: DC | PRN
  Administered 2021-04-23: 5 mL via TOPICAL

## 2021-04-23 MED ORDER — ONDANSETRON HCL 4 MG/2ML IJ SOLN
INTRAMUSCULAR | Status: AC
  Filled 2021-04-23: qty 2

## 2021-04-23 MED ORDER — CHLORHEXIDINE GLUCONATE 0.12 % MT SOLN: 15.0000 mL | Freq: Once | OROMUCOSAL | Status: AC

## 2021-04-23 MED ORDER — ONDANSETRON HCL 4 MG/2ML IJ SOLN
INTRAMUSCULAR | Status: DC | PRN
  Administered 2021-04-23: 4 mg via INTRAVENOUS

## 2021-04-23 MED ORDER — ALUM & MAG HYDROXIDE-SIMETH 200-200-20 MG/5ML PO SUSP: 30.0000 mL | Freq: Four times a day (QID) | ORAL | Status: DC | PRN

## 2021-04-23 MED ORDER — PROPOFOL 10 MG/ML IV BOLUS
INTRAVENOUS | Status: DC | PRN
  Administered 2021-04-23: 150 mg via INTRAVENOUS

## 2021-04-23 SURGICAL SUPPLY — 57 items
BAND RUBBER #18 3X1/16 STRL (MISCELLANEOUS) ×4 IMPLANT
BASKET BONE COLLECTION (BASKET) ×2 IMPLANT
BENZOIN TINCTURE PRP APPL 2/3 (GAUZE/BANDAGES/DRESSINGS) ×2 IMPLANT
BIT DRILL NEURO 2X3.1 SFT TUCH (MISCELLANEOUS) ×1 IMPLANT
BUR MATCHSTICK NEURO 3.0 LAGG (BURR) ×2 IMPLANT
CANISTER SUCT 3000ML PPV (MISCELLANEOUS) ×2 IMPLANT
CARTRIDGE OIL MAESTRO DRILL (MISCELLANEOUS) ×1 IMPLANT
CLSR STERI-STRIP ANTIMIC 1/2X4 (GAUZE/BANDAGES/DRESSINGS) ×2 IMPLANT
COVER WAND RF STERILE (DRAPES) ×2 IMPLANT
DERMABOND ADVANCED (GAUZE/BANDAGES/DRESSINGS) ×1
DERMABOND ADVANCED .7 DNX12 (GAUZE/BANDAGES/DRESSINGS) ×1 IMPLANT
DIFFUSER DRILL AIR PNEUMATIC (MISCELLANEOUS) ×2 IMPLANT
DRAPE C-ARM 42X72 X-RAY (DRAPES) ×4 IMPLANT
DRAPE LAPAROTOMY 100X72 PEDS (DRAPES) ×2 IMPLANT
DRAPE MICROSCOPE LEICA (MISCELLANEOUS) ×2 IMPLANT
DRILL NEURO 2X3.1 SOFT TOUCH (MISCELLANEOUS) ×2
DRSG OPSITE POSTOP 4X8 (GAUZE/BANDAGES/DRESSINGS) ×2 IMPLANT
DURAPREP 6ML APPLICATOR 50/CS (WOUND CARE) ×2 IMPLANT
ELECT COATED BLADE 2.86 ST (ELECTRODE) ×2 IMPLANT
ELECT REM PT RETURN 9FT ADLT (ELECTROSURGICAL) ×2
ELECTRODE REM PT RTRN 9FT ADLT (ELECTROSURGICAL) ×1 IMPLANT
GAUZE 4X4 16PLY RFD (DISPOSABLE) IMPLANT
GAUZE SPONGE 4X4 12PLY STRL (GAUZE/BANDAGES/DRESSINGS) ×2 IMPLANT
GLOVE BIO SURGEON STRL SZ7 (GLOVE) IMPLANT
GLOVE BIO SURGEON STRL SZ8 (GLOVE) ×2 IMPLANT
GLOVE EXAM NITRILE XL STR (GLOVE) IMPLANT
GLOVE INDICATOR 8.5 STRL (GLOVE) ×2 IMPLANT
GLOVE SURG UNDER POLY LF SZ7 (GLOVE) IMPLANT
GOWN STRL REUS W/ TWL LRG LVL3 (GOWN DISPOSABLE) IMPLANT
GOWN STRL REUS W/ TWL XL LVL3 (GOWN DISPOSABLE) ×1 IMPLANT
GOWN STRL REUS W/TWL 2XL LVL3 (GOWN DISPOSABLE) IMPLANT
GOWN STRL REUS W/TWL LRG LVL3 (GOWN DISPOSABLE)
GOWN STRL REUS W/TWL XL LVL3 (GOWN DISPOSABLE) ×1
HALTER HD/CHIN CERV TRACTION D (MISCELLANEOUS) ×2 IMPLANT
HEMOSTAT POWDER KIT SURGIFOAM (HEMOSTASIS) ×2 IMPLANT
KIT BASIN OR (CUSTOM PROCEDURE TRAY) ×2 IMPLANT
KIT TURNOVER KIT B (KITS) ×2 IMPLANT
NEEDLE SPNL 20GX3.5 QUINCKE YW (NEEDLE) ×2 IMPLANT
NS IRRIG 1000ML POUR BTL (IV SOLUTION) ×2 IMPLANT
OIL CARTRIDGE MAESTRO DRILL (MISCELLANEOUS) ×2
PACK LAMINECTOMY NEURO (CUSTOM PROCEDURE TRAY) ×2 IMPLANT
PAD ARMBOARD 7.5X6 YLW CONV (MISCELLANEOUS) ×6 IMPLANT
PIN DISTRACTION 14MM (PIN) IMPLANT
PLATE ANT CERV XTEND 2 LV (Plate) ×2 IMPLANT
SCREW VAR 4.2 XD SELF DRILL 14 (Screw) ×12 IMPLANT
SCREW XTD VAR 4.2 SELF TAP (Screw) IMPLANT
SPACER ACF PARALLEL 7MM (Bone Implant) ×2 IMPLANT
SPACER CC-ACF 8MM PARALLEL (Bone Implant) ×2 IMPLANT
SPONGE INTESTINAL PEANUT (DISPOSABLE) ×2 IMPLANT
SPONGE SURGIFOAM ABS GEL SZ50 (HEMOSTASIS) ×2 IMPLANT
STRIP CLOSURE SKIN 1/2X4 (GAUZE/BANDAGES/DRESSINGS) ×2 IMPLANT
SUT VIC AB 3-0 SH 8-18 (SUTURE) ×2 IMPLANT
SUT VICRYL 4-0 PS2 18IN ABS (SUTURE) ×2 IMPLANT
TAPE CLOTH 4X10 WHT NS (GAUZE/BANDAGES/DRESSINGS) IMPLANT
TOWEL GREEN STERILE (TOWEL DISPOSABLE) ×2 IMPLANT
TOWEL GREEN STERILE FF (TOWEL DISPOSABLE) ×2 IMPLANT
WATER STERILE IRR 1000ML POUR (IV SOLUTION) ×2 IMPLANT

## 2021-04-23 NOTE — Progress Notes (Signed)
Pharmacy Antibiotic Note  [name] is a 75 y.o. 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 Pt is S/P cervical discectomy this afternoon.  Pharmacy has been consulted for vancomycin dosing for post-op surgical prophylaxis.  Pt rec'd vancomycin 1 gm IV X 1 pre op at 0945 AM today. Per RN, pt has no drains in place post op.   Plan: Vancomycin 1 gm IV X 1 ~12 hrs after pre-op dose (dose scheduled for 2130 PM tonight)  Height: 5\' 6"  (167.6 cm) Weight: 75.3 kg (166 lb 0.1 oz) IBW/kg (Calculated) : 59.3  Temp (24hrs), Avg:98.2 F (36.8 C), Min:98 F (36.7 C), Max:98.4 F (36.9 C)  Recent Labs  Lab 04/21/21 1335  WBC 9.0  CREATININE 0.61    Estimated Creatinine Clearance: 65 mL/min (by C-G formula based on SCr of 0.61 mg/dL).    Allergies  Allergen Reactions  . Norvasc [Amlodipine Besylate] Other (See Comments)    Edema   . Latex Itching and Rash  . Lipitor [Atorvastatin] Other (See Comments)    Muscle aches  . Lisinopril Swelling and Cough  . Penicillins Rash    Tolerated Ancef 2g IV on 02/19/21  . Sulfa Antibiotics Rash  . Tape Itching and Rash  . Tetanus Toxoids Other (See Comments)    Patient said "Didn't do well".    Microbiology results: 5/16  MRSA PCR: negative 5/16 COVID: negative  Thank you for allowing pharmacy to be a part of this patient's care.  Gillermina Hu, PharmD, BCPS, [facility] Clinical Pharmacist 04/23/2021 6:46 PM

## 2021-04-23 NOTE — Anesthesia Procedure Notes (Signed)
Procedure Name: Intubation Date/Time: 04/23/2021 11:02 AM Performed by: Jonna Munro, CRNA Pre-anesthesia Checklist: Patient identified, Emergency Drugs available, Suction available, Patient being monitored and Timeout performed Patient Re-evaluated:Patient Re-evaluated prior to induction Oxygen Delivery Method: Circle system utilized Preoxygenation: Pre-oxygenation with 100% oxygen Induction Type: IV induction Ventilation: Mask ventilation without difficulty Laryngoscope Size: Glidescope and 3 Grade View: Grade I Tube type: Oral Tube size: 7.0 mm Number of attempts: 1 Airway Equipment and Method: Stylet and Video-laryngoscopy Placement Confirmation: ETT inserted through vocal cords under direct vision,  positive ETCO2 and breath sounds checked- equal and bilateral

## 2021-04-23 NOTE — H&P (Signed)
[name] is an 75 y.o. female.   Chief Complaint: Neck pain numbness tingling weakness in her hands difficulty walking HPI: 75 year old female with signs and symptoms clinical exam consistent with a cervical myelopathy.  Imaging revealed severe cord compression at C4-5 and C5-6.  Due to patient's progressive clinical syndrome imaging findings and failed conservative treatment I recommended anterior cervical discectomy and fusion at those 2 levels.  I extensively reviewed the risks and benefits of the operation with her as well as perioperative course expectations of outcome and alternatives to surgery and she understood and agreed to proceed forward.  Past Medical History:  Diagnosis Date  . Allergic rhinitis   . Breast cancer ([place]) 10/17/2020  . CAD (coronary artery disease)    , s/p CAGB 01/2011- LIMA to LAD patent, all other vein grafts occluded. Circumflex DES 3/13 placed following lateral ischemia on nuclear stress test  . Cervical spondylosis    , bulging  discs  . Disc herniation    L4-L5, L5-S1  . Family history of breast cancer 11/18/2020  . Hypertension   . Mixed hyperlipidemia   . Obesity   . Post concussion syndrome    processes information slowly,   . Postmenopausal   . Prediabetes     Past Surgical History:  Procedure Laterality Date  . ABDOMINAL HYSTERECTOMY    . Bone Spur Left    Foot  . BREAST BIOPSY    . BREAST ENHANCEMENT SURGERY    . BREAST IMPLANT EXCHANGE     replaced rupture implant  . BREAST LUMPECTOMY WITH RADIOACTIVE SEED LOCALIZATION Right 12/11/2020   Procedure: RIGHT BREAST LUMPECTOMY WITH RADIOACTIVE SEED LOCALIZATION;  Surgeon: Jovita Kussmaul, MD;  Location: [facility];  Service: General;  Laterality: Right;  . CARDIAC CATHETERIZATION    . COLONOSCOPY    . CORONARY ARTERY BYPASS GRAFT    . JOINT REPLACEMENT     right total knee replacement  . Left Bunionectomy    . LEFT HEART CATHETERIZATION WITH CORONARY/GRAFT ANGIOGRAM   02/05/2012   Procedure: LEFT HEART CATHETERIZATION WITH Beatrix Fetters;  Surgeon: Candee Furbish, MD;  Location: [facility] CATH LAB;  Service: Cardiovascular;;  . MASTECTOMY W/ SENTINEL NODE BIOPSY Right 02/19/2021   Procedure: RIGHT MASTECTOMY;  Surgeon: Jovita Kussmaul, MD;  Location: [facility];  Service: General;  Laterality: Right;  RNFA, PEC BLOCK  . PARTIAL HYSTERECTOMY    . RE-EXCISION OF BREAST CANCER,SUPERIOR MARGINS Right 01/15/2021   Procedure: RE-EXCISION RIGHT BREAST MEDIAL MARGIN;  Surgeon: Jovita Kussmaul, MD;  Location: [facility];  Service: General;  Laterality: Right;  . REPLACEMENT TOTAL KNEE Right   . SENTINEL NODE BIOPSY Right 02/19/2021   Procedure: SENTINEL LYMPH NODE BIOPSY;  Surgeon: Jovita Kussmaul, MD;  Location: [facility];  Service: General;  Laterality: Right;  . TONSILLECTOMY    . TUBAL LIGATION Bilateral     Family History  Problem Relation Age of Onset  . Heart disease Mother   . Hypertension Mother   . Heart attack Father   . Heart disease Brother   . Heart disease Brother   . Breast cancer Paternal Grandmother        dx unknown age  . Breast cancer Paternal Aunt        dx unknown age   Social History:  reports that she has never smoked. She has never used smokeless tobacco. She reports previous alcohol use. She reports that she does not use drugs.  Allergies:  Allergies  Allergen Reactions  . Norvasc [Amlodipine Besylate] Other (See Comments)    Edema   . Latex Itching and Rash  . Lipitor [Atorvastatin] Other (See Comments)    Muscle aches  . Lisinopril Swelling and Cough  . Penicillins Rash    Tolerated Ancef 2g IV on 02/19/21  . Sulfa Antibiotics Rash  . Tape Itching and Rash  . Tetanus Toxoids Other (See Comments)    Patient said "Didn't do well".    Medications Prior to Admission  Medication Sig Dispense Refill  . acetaminophen (TYLENOL) 500 MG tablet Take 500-1,000 mg by mouth every 6 (six) hours as needed (pain).    Marland Kitchen aspirin EC 81  MG tablet Take 81 mg by mouth daily. Swallow whole.    Marland Kitchen atenolol (TENORMIN) 50 MG tablet TAKE 1 TABLET BY MOUTH EVERY DAY (Patient taking differently: Take 50 mg by mouth daily.) 90 tablet 3  . B Complex-C (B-COMPLEX WITH VITAMIN C) tablet Take 1 tablet by mouth every evening.    . furosemide (LASIX) 20 MG tablet TAKE 1 TABLET BY MOUTH EVERY DAY AS NEEDED (Patient taking differently: Take 20 mg by mouth daily.) 90 tablet 3  . ibuprofen (ADVIL) 200 MG tablet Take 400-800 mg by mouth every 6 (six) hours as needed (pain).    . Multiple Vitamin (MULTIVITAMIN WITH MINERALS) TABS tablet Take 1 tablet by mouth daily.    . Potassium Gluconate 550 (90 K) MG TABS Take 550 mg by mouth every evening.    . rosuvastatin (CRESTOR) 20 MG tablet TAKE 1 TABLET BY MOUTH EVERY DAY (Patient taking differently: Take 20 mg by mouth every evening.) 90 tablet 3  . HYDROcodone-acetaminophen (NORCO/VICODIN) 5-325 MG tablet Take 1-2 tablets by mouth every 6 (six) hours as needed for moderate pain or severe pain. (Patient not taking: Reported on 04/14/2021) 15 tablet 0  . ketoconazole (NIZORAL) 2 % cream Apply 1 application topically daily. (Patient not taking: Reported on 04/14/2021) 15 g 4    Results for orders placed or performed during the hospital encounter of 04/21/21 (from the past 48 hour(s))  Basic metabolic panel per protocol     Status: Abnormal   Collection Time: 04/21/21  1:35 PM  Result Value Ref Range   Sodium 139 135 - 145 mmol/L   Potassium 3.5 3.5 - 5.1 mmol/L   Chloride 103 98 - 111 mmol/L   CO2 26 22 - 32 mmol/L   Glucose, Bld 106 (H) 70 - 99 mg/dL    Comment: Glucose reference range applies only to samples taken after fasting for at least 8 hours.   BUN 19 8 - 23 mg/dL   Creatinine, Ser 0.61 0.44 - 1.00 mg/dL   Calcium 9.6 8.9 - 10.3 mg/dL   GFR, Estimated >60 >60 mL/min    Comment: (NOTE) Calculated using the CKD-EPI Creatinine Equation (2021)    Anion gap 10 5 - 15    Comment: Performed at [facility] [address] [place], [place] [address]  CBC per protocol     Status: Abnormal   Collection Time: 04/21/21  1:35 PM  Result Value Ref Range   WBC 9.0 4.0 - 10.5 K/uL   RBC 4.75 3.87 - 5.11 MIL/uL   Hemoglobin 14.9 12.0 - 15.0 g/dL   HCT 46.2 (H) 36.0 - 46.0 %   MCV 97.3 80.0 - 100.0 fL   MCH 31.4 26.0 - 34.0 pg   MCHC 32.3 30.0 - 36.0 g/dL   RDW  12.5 11.5 - 15.5 %   Platelets 210 150 - 400 K/uL   nRBC 0.0 0.0 - 0.2 %    Comment: Performed at [facility], [place] [address], [place], [place] [address]  Surgical pcr screen     Status: None   Collection Time: 04/21/21  1:36 PM   Specimen: Nasal Mucosa; Nasal Swab  Result Value Ref Range   MRSA, PCR NEGATIVE NEGATIVE   Staphylococcus aureus NEGATIVE NEGATIVE    Comment: (NOTE) The Xpert SA Assay (FDA approved for NASAL specimens in patients 78 years of age and older), is one component of a comprehensive surveillance program. It is not intended to diagnose infection nor to guide or monitor treatment. Performed at [facility], [place] [address], [place], [place] [address]   SARS CORONAVIRUS 2 (TAT 6-24 HRS) Nasopharyngeal Nasopharyngeal Swab     Status: None   Collection Time: 04/21/21  1:36 PM   Specimen: Nasopharyngeal Swab  Result Value Ref Range   SARS Coronavirus 2 NEGATIVE NEGATIVE    Comment: (NOTE) SARS-CoV-2 target nucleic acids are NOT DETECTED.  The SARS-CoV-2 RNA is generally detectable in upper and lower respiratory specimens during the acute phase of infection. Negative results do not preclude SARS-CoV-2 infection, do not rule out co-infections with other pathogens, and should not be used as the sole basis for treatment or other patient management decisions. Negative results must be combined with clinical observations, patient history, and epidemiological information. The expected result is Negative.  Fact Sheet for Patients: SugarRoll.be  Fact Sheet for  Healthcare Providers: https://www.woods-mathews.com/  This test is not yet approved or cleared by the [place] FDA and  has been authorized for detection and/or diagnosis of SARS-CoV-2 by FDA under an Emergency Use Authorization (EUA). This EUA will remain  in effect (meaning this test can be used) for the duration of the COVID-19 declaration under Se ction 564(b)(1) of the Act, 21 U.S.C. section 360bbb-3(b)(1), unless the authorization is terminated or revoked sooner.  Performed at [facility], [place] [address], [place], [place] [address]   Type and screen [place]     Status: None   Collection Time: 04/21/21  1:40 PM  Result Value Ref Range   ABO/RH(D) A POS    Antibody Screen NEG    Sample Expiration 05/05/2021,2359    Extend sample reason      NO TRANSFUSIONS OR PREGNANCY IN THE PAST 3 MONTHS Performed at [facility], [place] [address], [place], [place] [address]    No results found.  Review of Systems  Musculoskeletal: Positive for neck pain.  Neurological: Positive for weakness and numbness.    Blood pressure (!) 140/53, pulse (!) 56, temperature 98.4 F (36.9 C), temperature source Oral, resp. rate 18, height 5\' 6"  (1.676 m), weight 75.3 kg, SpO2 98 %. Physical Exam HENT:     Head: Normocephalic.     Right Ear: Tympanic membrane normal.     Nose: Nose normal.  Eyes:     Pupils: Pupils are equal, round, and reactive to light.  Cardiovascular:     Rate and Rhythm: Normal rate.  Pulmonary:     Effort: Pulmonary effort is normal.  Abdominal:     General: Abdomen is flat.  Musculoskeletal:        General: Normal range of motion.  Skin:    General: Skin is warm.  Neurological:     General: No focal deficit present.  Mental Status: She is alert.     Comments: Exam is 5 out of 5 deltoid, bicep, tricep, wrist flexion, wrist extension, hand intrinsics.  Reflexes are hyperreflexic  Psychiatric:        Mood and Affect:  Mood normal.      Assessment/Plan 75 year old with cervical myelopathy presents for an ACDF C4-5 C5-6.  Elaina Hoops, MD 04/23/2021, 10:15 AM

## 2021-04-23 NOTE — Op Note (Signed)
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  Due to patient's progression of clinical syndrome imaging findings and failed conservative treatment I recommended anterior cervical discectomies and fusion at that level.  I extensively went over the risks and benefits of the operation with her as well as perioperative course expectations of outcome and alternatives of surgery and she understood and agreed to proceed forward.  Operative procedure: Patient was brought into the OR was [facility] general anesthesia positioned supine the neck in slight extension 5 pounds or halter traction the right 7 neck was prepped and draped in routine sterile fashion preoperative x-ray localized the appropriate level so a curvilinear incision was made just off the midline to the anterior border of the sternocleidomastoid.  The superficial layer of the platysma was dissected out divided longitudinally.  The avascular plane between the sternomastoid and strap muscle was developed down to the previous fashion prevertebral fascia was dissected away with Kitners.  There was an extensive amount of anterior osteophyte consistent with diffuse idiopathic skeletal hyperostosis overlying primarily the 5 6 disc base but extending into the inferior aspect of the 4 5 disc base I was able to identify the 4 5 disc base and remove the anterior osteophyte then I had to drill through some of the osteophyte to find  the 5 6 disc base.  Both intraoperative x-ray confirmed defecation both levels.  Self-retaining retractors were placed both disc bases were drilled down then under microscopic lamination further drilling down the 4 5 disc base allowed identification of posterior annulus and osteophytic complex.  Under biting the posterior longitudinal ligament allowed removal of the posterior osteophytes, off the endplates and several large free fragments of disc that have migrated subligamentous left-sided spinal cord.  This was all removed thecal sac resumed his normal anatomic position both C5 pedicles were identified and both C5 nerve roots were skeletonized flush with pedicle.  This was then packed with Gelfoam tension taken at C5-6 extensive osteophyte was drilled down and this pathology here was entirely osteophyte coming off mostly the C5 vertebral body but also way behind the C6 vertebral body this was all aggressively under Bitton.  Both C6 pedicles were identified and both C6 nerve root skeletonized flush with pedicle.  Both disc bases were then prepped sized up a 7 mm allograft was inserted at 4 5 and 8 mm allograft was inserted at 5 6 I then selected a 32 mm globus extend plate and all screws had excellent purchase locking mechanisms were engaged wound was then copiously irrigated meticulous hemostasis was maintained and the wound was closed in layers with opted Vicryl and platysma and a running 4 subcuticular Dermabond benzoin Steri-Strips and a sterile dressing was applied patient recovery in stable condition.  At the end the case all needle count sponge counts were correct.

## 2021-04-23 NOTE — Progress Notes (Signed)
Pt arrived to 4NP06 in wheelchair. Pt mobilizes well, has soft c-collar on, dressing c/d/i. Vitals WNL, and full assessment documented. Pt A&Ox4, no numbness/tingling etc.   Justice Rocher, RN

## 2021-04-23 NOTE — Plan of Care (Signed)

## 2021-04-23 NOTE — Consult Note (Addendum)
Cardiology Consultation:   Patient ID: Ceresa Mccleskey MRN: [id number]; DOB: [birth year]  Admit date: 04/23/2021 Date of Consult: 04/23/2021  PCP:  Joycelyn Rua, MD   [facility] Providers Cardiologist:  Donato Schultz, MD   {    Patient Profile:   [name] is a 75 y.o. female with a hx right breat cancer, HTN, CAD s/p CABG in 2012and PCI to LCx in 3/ 2013, first degree AV block, hyperlipidemia, cervical myopathy, of who is being seen 04/23/2021 for the evaluation of postop atrial flutter at the request of Dr. Wynetta Emery.   History of Present Illness:   Ms. Lafrenier follows Dr [name] outpatient for CAD.   She had CABG in 01/2011 with LIMA to LAD, SVG to diagonal, SVG to obtuse marginal, SVG to PDA. She underwent left cardiac cath on 02/05/2012 for chest pain and positive nuclear stress test revealing lateral wall ischemia, which showed patent LIMA to LAD graft but SVG to obtuse marginal, SVG to PDA, SVG to diagonal are occluded. She received successful PCI with DES to left Cx at that time.   She was last seen by Dr. [name] in the office on 10/23/2020 for preop cardiac evaluation before right breast surgery.  She was noted with first-degree AV block and maintained on atenolol carefully.   Patient has been suffering with cervical myelopathy with recent imaging revealing severe cord compression at C4-5 and C5-6.  She presented to the hospital 04/23/2021 for scheduled anterior cervical discectomy and fusion at those 2 levels.  Intraoperatively, she was noted with new onset of atrial flutter.  Cardiology is consulted for further management.  Diagnostic work-up from 04/21/21 revealed unremarkable BMP/CBC.  COVID-19 negative. EKG with Atrial flutter with ventricular rate of 64 bpm.   During encounter, she is sleepy from anesthesia, able to answer yes and no questions and follows one step commands. She denied history of atrial fibrillation or atrial flutter in the past.  She denied any chest  pain, heart palpitation, flutter sensation, irregular heartbeat sensation, dizziness. She states she was doing well at home before coming to surgery. She takes PRN lasix for leg swelling at times. She denied any SOB, orthopnea. She takes her atenolol, aspirin, and Crestor regularly.      Past Medical History:  Diagnosis Date  . Allergic rhinitis   . Breast cancer (HCC) 10/17/2020  . CAD (coronary artery disease)    , s/p CAGB 01/2011- LIMA to LAD patent, all other vein grafts occluded. Circumflex DES 3/13 placed following lateral ischemia on nuclear stress test  . Cervical spondylosis    , bulging  discs  . Disc herniation    L4-L5, L5-S1  . Family history of breast cancer 11/18/2020  . Hypertension   . Mixed hyperlipidemia   . Obesity   . Post concussion syndrome    processes information slowly,   . Postmenopausal   . Prediabetes     Past Surgical History:  Procedure Laterality Date  . ABDOMINAL HYSTERECTOMY    . Bone Spur Left    Foot  . BREAST BIOPSY    . BREAST ENHANCEMENT SURGERY    . BREAST IMPLANT EXCHANGE     replaced rupture implant  . BREAST LUMPECTOMY WITH RADIOACTIVE SEED LOCALIZATION Right 12/11/2020   Procedure: RIGHT BREAST LUMPECTOMY WITH RADIOACTIVE SEED LOCALIZATION;  Surgeon: Griselda Miner, MD;  Location: [facility];  Service: General;  Laterality: Right;  . CARDIAC CATHETERIZATION    . COLONOSCOPY    . CORONARY ARTERY  BYPASS GRAFT    . JOINT REPLACEMENT     right total knee replacement  . Left Bunionectomy    . LEFT HEART CATHETERIZATION WITH CORONARY/GRAFT ANGIOGRAM  02/05/2012   Procedure: LEFT HEART CATHETERIZATION WITH Beatrix Fetters;  Surgeon: Candee Furbish, MD;  Location: [facility] CATH [facility];  Service: Cardiovascular;;  . MASTECTOMY W/ SENTINEL NODE BIOPSY Right 02/19/2021   Procedure: RIGHT MASTECTOMY;  Surgeon: Jovita Kussmaul, MD;  Location: [place];  Service: General;  Laterality: Right;  RNFA, PEC BLOCK  . PARTIAL HYSTERECTOMY    .  RE-EXCISION OF BREAST CANCER,SUPERIOR MARGINS Right 01/15/2021   Procedure: RE-EXCISION RIGHT BREAST MEDIAL MARGIN;  Surgeon: Jovita Kussmaul, MD;  Location: [place];  Service: General;  Laterality: Right;  . REPLACEMENT TOTAL KNEE Right   . SENTINEL NODE BIOPSY Right 02/19/2021   Procedure: SENTINEL LYMPH NODE BIOPSY;  Surgeon: Jovita Kussmaul, MD;  Location: [place];  Service: General;  Laterality: Right;  . TONSILLECTOMY    . TUBAL LIGATION Bilateral      Home Medications:  Prior to Admission medications   Medication Sig Start Date End Date Taking? Authorizing Provider  acetaminophen (TYLENOL) 500 MG tablet Take 500-1,000 mg by mouth every 6 (six) hours as needed (pain).   Yes [provider]  aspirin EC 81 MG tablet Take 81 mg by mouth daily. Swallow whole.   Yes [provider]  atenolol (TENORMIN) 50 MG tablet TAKE 1 TABLET BY MOUTH EVERY DAY Patient taking differently: Take 50 mg by mouth daily. 11/11/20  Yes Jerline Pain, MD  B Complex-C (B-COMPLEX WITH VITAMIN C) tablet Take 1 tablet by mouth every evening.   Yes [provider]  furosemide (LASIX) 20 MG tablet TAKE 1 TABLET BY MOUTH EVERY DAY AS NEEDED Patient taking differently: Take 20 mg by mouth daily. 12/24/20  Yes Jerline Pain, MD  ibuprofen (ADVIL) 200 MG tablet Take 400-800 mg by mouth every 6 (six) hours as needed (pain).   Yes [provider]  Multiple Vitamin (MULTIVITAMIN WITH MINERALS) TABS tablet Take 1 tablet by mouth daily.   Yes [provider]  Potassium Gluconate 550 (90 K) MG TABS Take 550 mg by mouth every evening.   Yes [provider]  rosuvastatin (CRESTOR) 20 MG tablet TAKE 1 TABLET BY MOUTH EVERY DAY Patient taking differently: Take 20 mg by mouth every evening. 11/07/20  Yes Jerline Pain, MD  HYDROcodone-acetaminophen (NORCO/VICODIN) 5-325 MG tablet Take 1-2 tablets by mouth every 6 (six) hours as needed for moderate pain or severe  pain. Patient not taking: Reported on 04/14/2021 02/20/21   [name], MD  ketoconazole (NIZORAL) 2 % cream Apply 1 application topically daily. Patient not taking: Reported on 04/14/2021 02/05/21   Magrinat, Virgie Dad, MD    Inpatient Medications: Scheduled Meds: . Chlorhexidine Gluconate Cloth  6 each Topical Once   And  . Chlorhexidine Gluconate Cloth  6 each Topical Once  . fentaNYL       Continuous Infusions: . acetaminophen    . lactated ringers 10 mL/hr at 04/23/21 1049   PRN Meds: acetaminophen, acetaminophen **OR** acetaminophen (TYLENOL) oral liquid 160 mg/5 mL, fentaNYL (SUBLIMAZE) injection, oxyCODONE **OR** oxyCODONE  Allergies:    Allergies  Allergen Reactions  . Norvasc [Amlodipine Besylate] Other (See Comments)    Edema   . Latex Itching and Rash  . Lipitor [Atorvastatin] Other (See Comments)    Muscle aches  . Lisinopril Swelling and Cough  .  Penicillins Rash    Tolerated Ancef 2g IV on 02/19/21  . Sulfa Antibiotics Rash  . Tape Itching and Rash  . Tetanus Toxoids Other (See Comments)    Patient said "Didn't do well".    Social History:   Social History   Socioeconomic History  . Marital status: Married    Spouse name: Not on file  . Number of children: Not on file  . Years of education: Not on file  . Highest education level: Not on file  Occupational History  . Not on file  Tobacco Use  . Smoking status: Never Smoker  . Smokeless tobacco: Never Used  Vaping Use  . Vaping Use: Never used  Substance and Sexual Activity  . Alcohol use: Not Currently    Comment: Occasionally  . Drug use: No  . Sexual activity: Not on file  Other Topics Concern  . Not on file  Social History Narrative   Right handed   Drinks cafffeine   2 story home   Social Determinants of Health   Financial Resource Strain: Not on file  Food Insecurity: Not on file  Transportation Needs: Not on file  Physical Activity: Not on file  Stress: Not on file  Social  Connections: Not on file  Intimate Partner Violence: Not At Risk  . Fear of Current or Ex-Partner: No  . Emotionally Abused: No  . Physically Abused: No  . Sexually Abused: No    Family History:    Family History  Problem Relation Age of Onset  . Heart disease Mother   . Hypertension Mother   . Heart attack Father   . Heart disease Brother   . Heart disease Brother   . Breast cancer Paternal Grandmother        dx unknown age  . Breast cancer Paternal Aunt        dx unknown age     ROS:  Constitutional: Denied fever, chills Eyes: Denied vision change Ears/Nose/Mouth/Throat: Denied coughing, sinus pain Cardiovascular: see HPI  Respiratory: denied shortness of breath Gastrointestinal: Denied nausea, vomiting, abdominal pain, diarrhea Genital/Urinary: Denied dysuria, hematuria, urinary frequency/urgency Musculoskeletal: Denied muscle ache, joint pain Skin: Denied rash, wound Neuro: Denied headache, dizziness, syncope Psych: Denied history of depression/anxiety  Endocrine: Denied history of diabetes   Physical Exam/Data:   Vitals:   04/23/21 1410 04/23/21 1425 04/23/21 1440 04/23/21 1510  BP: (!) 147/68 (!) 153/77 (!) 163/80 130/69  Pulse: (!) 52 66 71 61  Resp: 13 12 (!) 9 11  Temp:   98 F (36.7 C)   TempSrc:      SpO2: 93% 96% 100% 98%  Weight:      Height:        Intake/Output Summary (Last 24 hours) at 04/23/2021 1535 Last data filed at 04/23/2021 1244 Gross per 24 hour  Intake 700 ml  Output 50 ml  Net 650 ml   Last 3 Weights 04/23/2021 04/21/2021 02/19/2021  Weight (lbs) 166 lb 0.1 oz 166 lb 170 lb  Weight (kg) 75.3 kg 75.297 kg 77.111 kg     Body mass index is 26.79 kg/m.   Vitals:  Vitals:   04/23/21 1440 04/23/21 1510  BP: (!) 163/80 130/69  Pulse: 71 61  Resp: (!) 9 11  Temp: 98 F (36.7 C)   SpO2: 100% 98%   General Appearance: In no apparent distress, laying in bed, sleepy  HEENT: Normocephalic, atraumatic. Cervical collar in place/not  removed for exam of neck  Cardiovascular: Irregularly  irregular, normal S1-S2,  no murmur/rub/gallop  Respiratory: Resting breathing unlabored, lungs sounds clear to auscultation bilaterally, no use of accessory muscles. On2LNC  No wheezes, rales or rhonchi.   Gastrointestinal: Bowel sounds hypoactive, abdomen soft, non-tender, non-distended.  Extremities: BLE with SCDs, Genitourinary: No CVA tenderness / genital exam not performed Musculoskeletal: Normal muscle bulk and tone, muscle strength 5/5 throughout, no limited range of motion, no swollen or erythematous joints Skin: Intact, warm, dry. No rashes or petechiae noted in exposed areas.  Neurologic: Alert, oriented to person, place and time. Fluent speech, no facial droop, no cognitive deficit, no pronator drift, no gross sensory deficit, no focal muscle weakness, no gross focal neuro deficit Psychiatric: Normal affect. Mood is appropriate.    EKG:  The EKG was personally reviewed and demonstrates:  Atrial flutter with rate of 64 bpm  Telemetry:  Telemetry was personally reviewed and demonstrates:  A fib with rate of 60s currently   Relevant CV Studies:  Echo from 12/19/2015:  - Left ventricle: The cavity size was normal. Wall thickness was  normal. Systolic function was normal. The estimated ejection  fraction was in the range of 55% to 60%. Wall motion was normal;  there were no regional wall motion abnormalities. Features are  consistent with a pseudonormal left ventricular filling pattern,  with concomitant abnormal relaxation and increased filling  pressure (grade 2 diastolic dysfunction). Doppler parameters are  consistent with high ventricular filling pressure.  - Left atrium: The atrium was mildly dilated.  - Pulmonary arteries: Systolic pressure was mildly increased.   Impressions:   - Normal LV systolic function; grade 2 diastolic dysfunction with  elevated LV filling pressure; mild LAE; mild TR with  mildly  elevated pulmonary pressure.    Laboratory Data:  High Sensitivity Troponin:  No results for input(s): TROPONINIHS in the last 720 hours.   Chemistry Recent Labs  Lab 04/21/21 1335  NA 139  K 3.5  CL 103  CO2 26  GLUCOSE 106*  BUN 19  CREATININE 0.61  CALCIUM 9.6  GFRNONAA >60  ANIONGAP 10    No results for input(s): PROT, ALBUMIN, AST, ALT, ALKPHOS, BILITOT in the last 168 hours. Hematology Recent Labs  Lab 04/21/21 1335  WBC 9.0  RBC 4.75  HGB 14.9  HCT 46.2*  MCV 97.3  MCH 31.4  MCHC 32.3  RDW 12.5  PLT 210   BNPNo results for input(s): BNP, PROBNP in the last 168 hours.  DDimer No results for input(s): DDIMER in the last 168 hours.   Radiology/Studies:  DG Cervical Spine 1 View  Result Date: 04/23/2021 CLINICAL DATA:  Surgery, elective. Additional history provided: C4-6 ACDF. Provided fluoroscopy time 7 seconds (0.43 mGy). EXAM: DG CERVICAL SPINE - 1 VIEW; DG C-ARM 1-60 MIN COMPARISON:  Cervical spine MRI 12/27/2020. FINDINGS: A single lateral view intraoperative fluoroscopic image of the cervical spine is submitted. On the provided image, ACDF hardware is present at the C4-C6 levels (ventral plate and screws). Interbody devices are also present at C4-C5 and C5-C6. Curvilinear hyperdensity projects ventral to the cervical spine at the C5-C7 levels, likely reflecting a surgical sponge/packing material. Partially visualized ET tube. IMPRESSION: Single lateral view intraoperative fluoroscopic image of the cervical spine from C4-C6 ACDF, as described. Curvilinear hyperdensity projects ventral to the cervical spine at the C5-C7 levels, likely reflecting a surgical sponge/packing material. Correlate with the operative history. Electronically Signed   By: Kellie Simmering DO   On: 04/23/2021 13:35   DG C-Arm 1-60 Min  Result Date: 04/23/2021 CLINICAL DATA:  Surgery, elective. Additional history provided: C4-6 ACDF. Provided fluoroscopy time 7 seconds (0.43 mGy).  EXAM: DG CERVICAL SPINE - 1 VIEW; DG C-ARM 1-60 MIN COMPARISON:  Cervical spine MRI 12/27/2020. FINDINGS: A single lateral view intraoperative fluoroscopic image of the cervical spine is submitted. On the provided image, ACDF hardware is present at the C4-C6 levels (ventral plate and screws). Interbody devices are also present at C4-C5 and C5-C6. Curvilinear hyperdensity projects ventral to the cervical spine at the C5-C7 levels, likely reflecting a surgical sponge/packing material. Partially visualized ET tube. IMPRESSION: Single lateral view intraoperative fluoroscopic image of the cervical spine from C4-C6 ACDF, as described. Curvilinear hyperdensity projects ventral to the cervical spine at the C5-C7 levels, likely reflecting a surgical sponge/packing material. Correlate with the operative history. Electronically Signed   By: Kellie Simmering DO   On: 04/23/2021 13:35     Assessment and Plan:   Post-operative atrial flutter/atrial fibrillation with controlled rate Hx of first degree AV block  - no prior hx of afib/aflutter - appears occurred intraoperatively today (<48 hours) - CHA2DS2VASc score is 4 due to age, gender, HTN, CAD - unable to start anticoagulation due to immediate postop anterior cervical discectomy and fusion at C4-5 and C5-6, recommend start Eliquis 5mg  BID when neurosurgery clears patient for anticoagulation  - rate is controlled at this time, may resume home atenolol  - if rate remains controlled and asymptomatic, may discharge to follow up with cardiology outpatient for DCCV after 3 weeks of uninterrupted anticoagulation, unless rhythm resolved spontaneously  - will check labs include TSH, Mag, BMP, CBC - will update Echo   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  - currently has no chest pain - continue GDMT with BB, statin, ASA held per surgery   HTN - BP elevated, possible due to pain - resume atenolol   HLD - no recent lipid profile - resume crestor 20 mg daily   Hx of right breast cancer -s/p right MASTECTOMY 02/20/21      Risk Assessment/Risk Scores:          CHA2DS2-VASc Score = 4  This indicates a 4.8% annual risk of stroke. The patient's score is based upon: CHF History: No HTN History: Yes Diabetes History: No Stroke History: No Vascular Disease History: Yes Age Score: 1 Gender Score: 1        For questions or updates, please contact [place] Please consult [email or web address] for contact info under    Signed, Margie Billet, NP  04/23/2021 3:35 PM   Patient seen, examined. Available data reviewed. Agree with findings, assessment, and plan as outlined by Margie Billet, NP.  The patient is independently interviewed and examined.  She is awake and alert in no distress.  HEENT is normal.  Neck is in a c-collar unable to see carotids or JVP.  Lungs are clear bilaterally, heart is irregularly irregular with no murmur gallop, abdomen is soft and nontender with no masses, extremities have no edema and peripheral pulses are intact and equal, skin is warm and dry with no  rash.  EKG reveals atrial flutter 64 bpm with variable AV block.  The patient underwent cervical discectomy and fusion today via an anterior approach.  During surgery, she developed atrial flutter.  Her heart rate has remained controlled and she has continued to demonstrate hemodynamic stability.  She is having no cardiovascular symptoms at this time.  Her cardiac history includes that of coronary artery disease with prior CABG and PCI.  She has been off of antiplatelet therapy in preparation for surgery.  We will  check a 2D echo while she is here in the hospital.  We will check electrolytes and thyroid studies.  Recommend starting oral anticoagulation when safe from a postoperative perspective, presuming this will be at least a few days before we start apixaban 5 mg twice daily.  Would consider elective cardioversion after 3 weeks of therapeutic anticoagulation.  We will follow-up tomorrow to reassess the patient's heart rhythm and clinical status.    Sherren Mocha, M.D. 04/23/2021 4:45 PM

## 2021-04-23 NOTE — Transfer of Care (Signed)
Immediate Anesthesia Transfer of Care Note  Patient: [name]  Procedure(s) Performed: Anterior Cervical Discectomy Fusion - Cervical Four- Cervical Five - Cervical Five -Cervical Six (N/A )  Patient Location: PACU  Anesthesia Type:General  Level of Consciousness: awake, alert , oriented and patient cooperative  Airway & Oxygen Therapy: Patient Spontanous Breathing and Patient connected to face mask oxygen  Post-op Assessment: Report given to RN and Post -op Vital signs reviewed and stable  Post vital signs: Reviewed and stable  Last Vitals:  Vitals Value Taken Time  BP    Temp    Pulse 65 04/23/21 1323  Resp    SpO2 99 % 04/23/21 1323  Vitals shown include unvalidated device data.  Last Pain:  Vitals:   04/23/21 0940  TempSrc:   PainSc: 0-No pain         Complications: No complications documented.

## 2021-04-24 ENCOUNTER — Other Ambulatory Visit (HOSPITAL_COMMUNITY): Payer: Self-pay

## 2021-04-24 ENCOUNTER — Observation Stay (HOSPITAL_BASED_OUTPATIENT_CLINIC_OR_DEPARTMENT_OTHER): Payer: Medicare HMO

## 2021-04-24 ENCOUNTER — Encounter (HOSPITAL_COMMUNITY): Payer: Self-pay | Admitting: Neurosurgery

## 2021-04-24 DIAGNOSIS — M4712 Other spondylosis with myelopathy, cervical region: Secondary | ICD-10-CM | POA: Diagnosis not present

## 2021-04-24 DIAGNOSIS — I4892 Unspecified atrial flutter: Secondary | ICD-10-CM | POA: Diagnosis not present

## 2021-04-24 DIAGNOSIS — Z96651 Presence of right artificial knee joint: Secondary | ICD-10-CM | POA: Diagnosis not present

## 2021-04-24 DIAGNOSIS — Z79899 Other long term (current) drug therapy: Secondary | ICD-10-CM | POA: Diagnosis not present

## 2021-04-24 DIAGNOSIS — I4819 Other persistent atrial fibrillation: Secondary | ICD-10-CM

## 2021-04-24 DIAGNOSIS — M4802 Spinal stenosis, cervical region: Secondary | ICD-10-CM | POA: Diagnosis not present

## 2021-04-24 DIAGNOSIS — Z7982 Long term (current) use of aspirin: Secondary | ICD-10-CM | POA: Diagnosis not present

## 2021-04-24 DIAGNOSIS — E785 Hyperlipidemia, unspecified: Secondary | ICD-10-CM | POA: Diagnosis not present

## 2021-04-24 DIAGNOSIS — Z91040 Latex allergy status: Secondary | ICD-10-CM | POA: Diagnosis not present

## 2021-04-24 DIAGNOSIS — R7303 Prediabetes: Secondary | ICD-10-CM | POA: Diagnosis not present

## 2021-04-24 DIAGNOSIS — Z853 Personal history of malignant neoplasm of breast: Secondary | ICD-10-CM | POA: Diagnosis not present

## 2021-04-24 DIAGNOSIS — I10 Essential (primary) hypertension: Secondary | ICD-10-CM | POA: Diagnosis not present

## 2021-04-24 DIAGNOSIS — I2510 Atherosclerotic heart disease of native coronary artery without angina pectoris: Secondary | ICD-10-CM | POA: Diagnosis not present

## 2021-04-24 LAB — ECHOCARDIOGRAM COMPLETE
AR max vel: 1.58 cm2
AV Area VTI: 1.52 cm2
AV Area mean vel: 1.52 cm2
AV Mean grad: 6 mmHg
AV Peak grad: 10.8 mmHg
Ao pk vel: 1.64 m/s
Calc EF: 72.1 %
Height: 66 in
S' Lateral: 3 cm
Single Plane A2C EF: 75.8 %
Single Plane A4C EF: 67.6 %
Weight: 2656.1 oz

## 2021-04-24 MED ORDER — PANTOPRAZOLE SODIUM 40 MG PO TBEC
40.0000 mg | DELAYED_RELEASE_TABLET | Freq: Every day | ORAL | Status: DC
  Administered 2021-04-25: 40 mg via ORAL
  Filled 2021-04-24 (×2): qty 1

## 2021-04-24 MED ORDER — MAGNESIUM OXIDE -MG SUPPLEMENT 400 (240 MG) MG PO TABS
400.0000 mg | ORAL_TABLET | Freq: Once | ORAL | Status: AC
  Administered 2021-04-24: 400 mg via ORAL
  Filled 2021-04-24: qty 1

## 2021-04-24 MED ORDER — FUROSEMIDE 20 MG PO TABS: 20.0000 mg | ORAL_TABLET | Freq: Every day | ORAL | Status: DC | PRN

## 2021-04-24 NOTE — Plan of Care (Signed)
  Problem: Education: Goal: Knowledge of General Education information will improve Description: Including pain rating scale, medication(s)/side effects and non-pharmacologic comfort measures Outcome: Progressing   Problem: Health Behavior/Discharge Planning: Goal: Ability to manage health-related needs will improve Outcome: Progressing   Problem: Clinical Measurements: Goal: Ability to maintain clinical measurements within normal limits will improve Outcome: Progressing Goal: Will remain free from infection Outcome: Completed/Met Goal: Respiratory complications will improve Outcome: Completed/Met

## 2021-04-24 NOTE — Anesthesia Postprocedure Evaluation (Signed)
Anesthesia Post Note  Patient: [name]  Procedure(s) Performed: Anterior Cervical Discectomy Fusion - Cervical Four- Cervical Five - Cervical Five -Cervical Six (N/A )     Patient location during evaluation: PACU Anesthesia Type: General Level of consciousness: awake and alert Pain management: pain level controlled Vital Signs Assessment: post-procedure vital signs reviewed and stable Respiratory status: spontaneous breathing, nonlabored ventilation, respiratory function stable and patient connected to nasal cannula oxygen Cardiovascular status: blood pressure returned to baseline and stable Postop Assessment: no apparent nausea or vomiting Anesthetic complications: no   No complications documented.  Last Vitals:  Vitals:   04/24/21 1117 04/24/21 1518  BP: (!) 126/53 (!) 110/54  Pulse: 61 61  Resp: 16 14  Temp: 36.9 C 36.7 C  SpO2: 95% 95%    Last Pain:  Vitals:   04/24/21 1518  TempSrc: Oral  PainSc:                  Sargon Scouten

## 2021-04-24 NOTE — Progress Notes (Signed)
Subjective: Patient reports Improved numbness tingling in her fingers a little bit of numbness in her toe but overall much improved from preop.  Objective: Vital signs in last 24 hours: Temp:  [97.9 F (36.6 C)-98.6 F (37 C)] 98.6 F (37 C) (05/19 0700) Pulse Rate:  [52-78] 78 (05/19 0700) Resp:  [8-18] 17 (05/19 0700) BP: (117-163)/(53-80) 117/78 (05/19 0700) SpO2:  [93 %-100 %] 95 % (05/19 0700) Weight:  [75.3 kg] 75.3 kg (05/18 0930)  Intake/Output from previous day: 05/18 0701 - 05/19 0700 In: 700 [I.V.:600; IV Piggyback:100] Out: 50 [Blood:50] Intake/Output this shift: No intake/output data recorded.  Strength 5 out of 5 incision clean dry and intact  Lab Results: Recent Labs    04/21/21 1335 04/23/21 1936  WBC 9.0 12.4*  HGB 14.9 15.3*  HCT 46.2* 46.2*  PLT 210 157   BMET Recent Labs    04/21/21 1335 04/23/21 1936  NA 139 136  K 3.5 3.8  CL 103 101  CO2 26 26  GLUCOSE 106* 154*  BUN 19 12  CREATININE 0.61 0.67  CALCIUM 9.6 9.2    Studies/Results: DG Cervical Spine 1 View  Result Date: 04/23/2021 CLINICAL DATA:  Surgery, elective. Additional history provided: C4-6 ACDF. Provided fluoroscopy time 7 seconds (0.43 mGy). EXAM: DG CERVICAL SPINE - 1 VIEW; DG C-ARM 1-60 MIN COMPARISON:  Cervical spine MRI 12/27/2020. FINDINGS: A single lateral view intraoperative fluoroscopic image of the cervical spine is submitted. On the provided image, ACDF hardware is present at the C4-C6 levels (ventral plate and screws). Interbody devices are also present at C4-C5 and C5-C6. Curvilinear hyperdensity projects ventral to the cervical spine at the C5-C7 levels, likely reflecting a surgical sponge/packing material. Partially visualized ET tube. IMPRESSION: Single lateral view intraoperative fluoroscopic image of the cervical spine from C4-C6 ACDF, as described. Curvilinear hyperdensity projects ventral to the cervical spine at the C5-C7 levels, likely reflecting a surgical  sponge/packing material. Correlate with the operative history. Electronically Signed   By: Kellie Simmering DO   On: 04/23/2021 13:35   DG C-Arm 1-60 Min  Result Date: 04/23/2021 CLINICAL DATA:  Surgery, elective. Additional history provided: C4-6 ACDF. Provided fluoroscopy time 7 seconds (0.43 mGy). EXAM: DG CERVICAL SPINE - 1 VIEW; DG C-ARM 1-60 MIN COMPARISON:  Cervical spine MRI 12/27/2020. FINDINGS: A single lateral view intraoperative fluoroscopic image of the cervical spine is submitted. On the provided image, ACDF hardware is present at the C4-C6 levels (ventral plate and screws). Interbody devices are also present at C4-C5 and C5-C6. Curvilinear hyperdensity projects ventral to the cervical spine at the C5-C7 levels, likely reflecting a surgical sponge/packing material. Partially visualized ET tube. IMPRESSION: Single lateral view intraoperative fluoroscopic image of the cervical spine from C4-C6 ACDF, as described. Curvilinear hyperdensity projects ventral to the cervical spine at the C5-C7 levels, likely reflecting a surgical sponge/packing material. Correlate with the operative history. Electronically Signed   By: Kellie Simmering DO   On: 04/23/2021 13:35    Assessment/Plan: Postop day 1 anterior cervical doing very well with significant provement preoperative symptoms.  Patient remains in a flutter appreciate cardiology's recommendations looks like they may perform an echocardiogram today will await their determination.  Patient can be discharged when stable from cardiology.  I would target 72 hours for initiation of anticoagulation if given an oral form as Apixaban.  LOS: 0 days     Elaina Hoops 04/24/2021, 8:22 AM

## 2021-04-24 NOTE — Progress Notes (Signed)
Patient called complaining that she is disoriented. patient asked where the ceiling, windows, and bathroom doors are. She is able to answer all orientation questions well with details. Neuro exam unchanged. Patient was  Oriented to the room and functions of all patient care equipment. Husband called so patient can hear a familiar voice. Bed alarm engaged and call bell given to patient. Will monitor

## 2021-04-24 NOTE — Progress Notes (Signed)
Orthopedic Tech Progress Note Patient Details:  [name] [birth year] [id number] Patients has brace Patient ID: [name], female   DOB: 03-22-1946, 75 y.o.   MRN: [id number]   Tammy Sours 04/24/2021, 1:03 PM

## 2021-04-24 NOTE — TOC Benefit Eligibility Note (Signed)
Patient Teacher, English as a foreign language completed.    The patient is currently admitted and upon discharge could be taking Eliquis 5 mg.  The current 30 day co-pay is, $47.00.   The patient is insured through [place], [place] Patient Advocate Specialist [place] Team Direct Number: [phone]  Fax: [phone]

## 2021-04-24 NOTE — Progress Notes (Addendum)
Progress Note  Patient Name: [name] Date of Encounter: 04/24/2021  [facility] Cardiologist: [name] Furbish, MD   Subjective   Patient states she feels well, denied any chest pain, SOB, heart palpitation, dizziness, flutter sensation. She is asking if her breast surgery in March this years caused her to go into atrial flutter. She is agreeable with anticoagulation and understand the risk versus benefit. Husband is at bedside.   Inpatient Medications    Scheduled Meds: . aspirin EC  81 mg Oral Daily  . atenolol  50 mg Oral Daily  . B-complex with vitamin C  1 tablet Oral QPM  . furosemide  20 mg Oral Daily  . magnesium oxide  400 mg Oral Once  . multivitamin with minerals  1 tablet Oral Daily  . pantoprazole (PROTONIX) IV  40 mg Intravenous QHS  . potassium chloride  2.4 mEq Oral QHS  . rosuvastatin  20 mg Oral QHS  . sodium chloride flush  3 mL Intravenous Q12H   Continuous Infusions: . sodium chloride     PRN Meds: acetaminophen **OR** acetaminophen, alum & mag hydroxide-simeth, cyclobenzaprine, HYDROmorphone (DILAUDID) injection, menthol-cetylpyridinium **OR** phenol, ondansetron **OR** ondansetron (ZOFRAN) IV, oxyCODONE, sodium chloride flush   Vital Signs    Vitals:   04/24/21 0011 04/24/21 0336 04/24/21 0345 04/24/21 0700  BP: 123/62 122/65 124/65 117/78  Pulse: 64  68 78  Resp: 12  11 17   Temp: 98.6 F (37 C) 97.9 F (36.6 C)  98.6 F (37 C)  TempSrc: Oral Oral  Oral  SpO2: 98%  97% 95%  Weight:      Height:        Intake/Output Summary (Last 24 hours) at 04/24/2021 0947 Last data filed at 04/23/2021 1244 Gross per 24 hour  Intake 700 ml  Output 50 ml  Net 650 ml   Last 3 Weights 04/23/2021 04/21/2021 02/19/2021  Weight (lbs) 166 lb 0.1 oz 166 lb 170 lb  Weight (kg) 75.3 kg 75.297 kg 77.111 kg      Telemetry    Atrial flutter with variable AV conduction, ventricular rate of 60-80s - Personally Reviewed  ECG    No new tracing today  -  Personally Reviewed  Physical Exam    GEN: No acute distress.   Neck: Cervical collar in place, no removed for exam of neck  Cardiac: Irregularly irregular, no murmurs, rubs, or gallops.  Respiratory: Clear to auscultation bilaterally. On Room air  GI: Soft, nontender, non-distended  MS: BLE trace edema; No deformity. Neuro:  Alert and oriented x2, carry conversation appopriately Psych: Normal affect   Labs    High Sensitivity Troponin:  No results for input(s): TROPONINIHS in the last 720 hours.    Chemistry Recent Labs  Lab 04/21/21 1335 04/23/21 1936  NA 139 136  K 3.5 3.8  CL 103 101  CO2 26 26  GLUCOSE 106* 154*  BUN 19 12  CREATININE 0.61 0.67  CALCIUM 9.6 9.2  GFRNONAA >60 >60  ANIONGAP 10 9     Hematology Recent Labs  Lab 04/21/21 1335 04/23/21 1936  WBC 9.0 12.4*  RBC 4.75 4.75  HGB 14.9 15.3*  HCT 46.2* 46.2*  MCV 97.3 97.3  MCH 31.4 32.2  MCHC 32.3 33.1  RDW 12.5 12.1  PLT 210 157    BNPNo results for input(s): BNP, PROBNP in the last 168 hours.   DDimer No results for input(s): DDIMER in the last 168 hours.   Radiology  DG Cervical Spine 1 View  Result Date: 04/23/2021 CLINICAL DATA:  Surgery, elective. Additional history provided: C4-6 ACDF. Provided fluoroscopy time 7 seconds (0.43 mGy). EXAM: DG CERVICAL SPINE - 1 VIEW; DG C-ARM 1-60 MIN COMPARISON:  Cervical spine MRI 12/27/2020. FINDINGS: A single lateral view intraoperative fluoroscopic image of the cervical spine is submitted. On the provided image, ACDF hardware is present at the C4-C6 levels (ventral plate and screws). Interbody devices are also present at C4-C5 and C5-C6. Curvilinear hyperdensity projects ventral to the cervical spine at the C5-C7 levels, likely reflecting a surgical sponge/packing material. Partially visualized ET tube. IMPRESSION: Single lateral view intraoperative fluoroscopic image of the cervical spine from C4-C6 ACDF, as described. Curvilinear hyperdensity  projects ventral to the cervical spine at the C5-C7 levels, likely reflecting a surgical sponge/packing material. Correlate with the operative history. Electronically Signed   By: Kellie Simmering DO   On: 04/23/2021 13:35   DG C-Arm 1-60 Min  Result Date: 04/23/2021 CLINICAL DATA:  Surgery, elective. Additional history provided: C4-6 ACDF. Provided fluoroscopy time 7 seconds (0.43 mGy). EXAM: DG CERVICAL SPINE - 1 VIEW; DG C-ARM 1-60 MIN COMPARISON:  Cervical spine MRI 12/27/2020. FINDINGS: A single lateral view intraoperative fluoroscopic image of the cervical spine is submitted. On the provided image, ACDF hardware is present at the C4-C6 levels (ventral plate and screws). Interbody devices are also present at C4-C5 and C5-C6. Curvilinear hyperdensity projects ventral to the cervical spine at the C5-C7 levels, likely reflecting a surgical sponge/packing material. Partially visualized ET tube. IMPRESSION: Single lateral view intraoperative fluoroscopic image of the cervical spine from C4-C6 ACDF, as described. Curvilinear hyperdensity projects ventral to the cervical spine at the C5-C7 levels, likely reflecting a surgical sponge/packing material. Correlate with the operative history. Electronically Signed   By: Kellie Simmering DO   On: 04/23/2021 13:35    Cardiac Studies   Echo is pending from today    Patient Profile      [name] is a 75 y.o. female with a hx right breat cancer, HTN, CAD s/p CABG in 2012and PCI to LCx in 3/ 2013, first degree AV block, hyperlipidemia, cervical myopathy, of who is followed by cardiology for postop atrial flutter at the request of Dr. Saintclair Halsted.  Assessment & Plan    Post-operative atrial flutter/atrial fibrillation with controlled rate Hx of first degree AV block  - no prior hx of afib/aflutter - appears occurred intraoperatively 5/18 (<48 hours) - CHA2DS2VAScscore is 4 due to age, gender, HTN, CAD - unable to start anticoagulation currently due to  immediate postop anterior cervical discectomy and fusion at C4-5 and C5-6, recommend start Eliquis 5mg  BID, neurosurgery cleared for anticoagulation 72 hours post surgery  - rate is controlled at this time, resumed home dose atenolol 50mg  daily  - TSH WNL , K 3.8 and Mag 1.9, will optimize with supplement today, Echo is pending today  - if rate remains controlled and asymptomatic, may discharge to follow up with cardiology outpatient for DCCV after 3 weeks of uninterrupted anticoagulation, unless rhythm resolved spontaneously   CAD - s/p CABG in 2012 and PCI to LCx in 3/ 2013 - last cath 2013 showed patent LIMA to LAD stent but SVG to obtuse marginal, SVG to PDA, SVG to diagonal are occluded.  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 Available data reviewed. Agree with findings, assessment, and plan as outlined by Margie Billet, NP.  The patient is independently interviewed and examined.  Lungs are clear, heart is irregular with no murmur or gallop, abdomen is soft and nontender, extremities have no edema, skin is warm and dry without rash.  Telemetry shows atrial fibrillation with well-controlled heart rate.  Recommend:  2D echocardiogram today  Start apixaban 5 mg twice  daily on Sunday, May 22  Follow-up as outpatient with Dr. Marlou Porch or an atrial fibs clinic (we will arrange) with tentative plans for cardioversion after 3 weeks of therapeutic anticoagulation.  Sherren Mocha, M.D. 04/24/2021 10:46 AM

## 2021-04-24 NOTE — Evaluation (Signed)
Physical Therapy Evaluation Patient Details Name: [name] MRN: [id number] DOB: 19-Nov-1946 Today's Date: 04/24/2021   History of Present Illness  The pt is a 75 yo female presenting 5/18 for ACDF of C4-6 due to c/o neck pain with numbness, tingling, and weakness in her hands. PMH includes: CAD s/p CABG in 2012, breast cancer, HTN, HLD, obesity, post-concussion syndrome, and R TKA.    Clinical Impression  Pt in bed upon arrival of PT, agreeable to evaluation at this time. Prior to admission the pt was mobilizing with use of standard walker in the home, but reports independence with ADLs and that she was intermittently able to assist her husband with IADLs. The pt now presents with limitations in functional mobility, power, endurance, strength, and dynamic stability due to above dx, and will continue to benefit from skilled PT to address these deficits. The pt was able to complete initial bed mobility and transfers with minG and verbal cues for log roll technique and hand positioning. She did need increased time to power up, as well as minA to steady with initial stance. The pt was then able to progress to ambulation of short distances within the room, but required minG for safety and intermittent minA to steady and manage RW. The pt's spouse is very supportive, both were educated on cervical precautions, energy conservation, fall risk reduction, and use of DME in the home to increase safety with mobility. The pt will continue to benefit from skilled PT to progress functional strength, endurance, and dynamic stability to allow for safe return home with her husband for support.      Follow Up Recommendations Home health PT;Supervision for mobility/OOB    Equipment Recommendations  Rolling walker with 5" wheels    Recommendations for Other Services       Precautions / Restrictions Precautions Precautions: Fall;Cervical Precaution Booklet Issued: Yes (comment) Precaution Comments:  discussed, pt needing verbal cues to apply to all mobility Required Braces or Orthoses: Cervical Brace Cervical Brace: Soft collar Restrictions Weight Bearing Restrictions: No      Mobility  Bed Mobility Overal bed mobility: Needs Assistance Bed Mobility: Rolling;Sidelying to Sit;Sit to Sidelying Rolling: Min guard Sidelying to sit: Min assist     Sit to sidelying: Min assist General bed mobility comments: minG to initiate log roll, minA to raise trunk from flat bed. minA to control lower to bed and minimize rotation with return to supine    Transfers Overall transfer level: Needs assistance Equipment used: Rolling walker (2 wheeled) Transfers: Sit to/from Stand Sit to Stand: Min assist;Min guard         General transfer comment: able to progress from minA topower up to minG within session. Requires verbal cues for hand placement, cues to move BUE from bed to RW grip, then to extend at hips.  Ambulation/Gait Ambulation/Gait assistance: Min guard;Min assist Gait Distance (Feet): 15 Feet (x2) Assistive device: Rolling walker (2 wheeled) Gait Pattern/deviations: Step-through pattern;Decreased stride length;Trunk flexed Gait velocity: decreased Gait velocity interpretation: <1.31 ft/sec, indicative of household ambulator General Gait Details: cues for posture, proximity to RW, pt needing minG for safety with intermittent minA to steady or manage RW.     Balance Overall balance assessment: History of Falls;Needs assistance Sitting-balance support: No upper extremity supported;Feet supported Sitting balance-Leahy Scale: Good     Standing balance support: Bilateral upper extremity supported Standing balance-Leahy Scale: Fair Standing balance comment: able to static stand without UE support, BUE support to maintain stability with mobility, intermittent minA  to steady                             Pertinent Vitals/Pain Pain Assessment: Faces Faces Pain Scale:  Hurts a little bit Pain Location: headache, incision Pain Descriptors / Indicators: Discomfort;Headache;Sore Pain Intervention(s): Limited activity within patient's tolerance;Monitored during session;Repositioned    Home Living Family/patient expects to be discharged to:: Private residence Living Arrangements: Spouse/significant other Available Help at Discharge: Family Type of Home: House Home Access: Stairs to enter Entrance Stairs-Rails: None ("storage shelf" on both sides) Entrance Stairs-Number of Steps: 1 Home Layout: Two level;Able to live on main level with bedroom/bathroom Home Equipment: Cane - single point;Grab bars - tub/shower;Walker - standard      Prior Function Level of Independence: Independent with assistive device(s)         Comments: Independent with RW, reports independence with ADLs, able to assist some with IADLs, but not recently. sleeps in recliner     Hand Dominance   Dominant Hand: Right    Extremity/Trunk Assessment   Upper Extremity Assessment Upper Extremity Assessment: Defer to OT evaluation (pt denies tingling or numbness in UE, states she has experienced weakness, difficulty holding plates of food for example prior tosurgery)    Lower Extremity Assessment Lower Extremity Assessment: Generalized weakness (pt denies difference in sensation bilaterally. reports she had intermittent shooting pain into LLE prior to surgery, has not experienced this after surgery)    Cervical / Trunk Assessment Cervical / Trunk Assessment: Other exceptions Cervical / Trunk Exceptions: s/p cervical surgery  Communication   Communication: No difficulties (increased processing time)  Cognition Arousal/Alertness: Awake/alert Behavior During Therapy: WFL for tasks assessed/performed Overall Cognitive Status: Within Functional Limits for tasks assessed                                 General Comments: increased processing time for all responses  (baseline following concussion), but pt able to answer questions regarding PLOF and home without issue.      General Comments General comments (skin integrity, edema, etc.): VSS on RA, pt with low HR through session (45-55 bpm)    Exercises     Assessment/Plan    PT Assessment Patient needs continued PT services  PT Problem List Decreased strength;Decreased range of motion;Decreased activity tolerance;Decreased balance;Decreased mobility;Decreased safety awareness;Decreased knowledge of precautions       PT Treatment Interventions DME instruction;Gait training;Stair training;Functional mobility training;Therapeutic activities;Therapeutic exercise;Balance training;Patient/family education    PT Goals (Current goals can be found in the Care Plan section)  Acute Rehab PT Goals Patient Stated Goal: return home PT Goal Formulation: With patient/family Time For Goal Achievement: 05/08/21 Potential to Achieve Goals: Good    Frequency Min 5X/week    AM-PAC PT "6 Clicks" Mobility  Outcome Measure Help needed turning from your back to your side while in a flat bed without using bedrails?: A Little Help needed moving from lying on your back to sitting on the side of a flat bed without using bedrails?: A Little Help needed moving to and from a bed to a chair (including a wheelchair)?: A Little Help needed standing up from a chair using your arms (e.g., wheelchair or bedside chair)?: A Little Help needed to walk in hospital room?: A Little Help needed climbing 3-5 steps with a railing? : A Lot 6 Click Score: 17    End of Session Equipment  Utilized During Treatment: Gait belt;Cervical collar Activity Tolerance: No increased pain;Patient tolerated treatment well Patient left: in bed;with call bell/phone within reach;with family/visitor present (echo present) Nurse Communication: Mobility status PT Visit Diagnosis: Other abnormalities of gait and mobility (R26.89);History of falling  (Z91.81)    Time: 5859-2924 PT Time Calculation (min) (ACUTE ONLY): 29 min   Charges:   PT Evaluation $PT Eval Low Complexity: 1 Low PT Treatments $Gait Training: 8-22 mins        Karma Ganja, PT, DPT   Acute Rehabilitation Department Pager #: [phone]  Otho Bellows 04/24/2021, 12:51 PM

## 2021-04-24 NOTE — Progress Notes (Signed)
*  PRELIMINARY RESULTS* Echocardiogram 2D Echocardiogram has been performed.  Luisa Hart RDCS 04/24/2021, 12:51 PM

## 2021-04-25 DIAGNOSIS — Z7982 Long term (current) use of aspirin: Secondary | ICD-10-CM | POA: Diagnosis not present

## 2021-04-25 DIAGNOSIS — Z79899 Other long term (current) drug therapy: Secondary | ICD-10-CM | POA: Diagnosis not present

## 2021-04-25 DIAGNOSIS — I2510 Atherosclerotic heart disease of native coronary artery without angina pectoris: Secondary | ICD-10-CM | POA: Diagnosis not present

## 2021-04-25 DIAGNOSIS — M4802 Spinal stenosis, cervical region: Secondary | ICD-10-CM | POA: Diagnosis not present

## 2021-04-25 DIAGNOSIS — I4819 Other persistent atrial fibrillation: Secondary | ICD-10-CM | POA: Diagnosis not present

## 2021-04-25 DIAGNOSIS — I4892 Unspecified atrial flutter: Secondary | ICD-10-CM | POA: Diagnosis not present

## 2021-04-25 DIAGNOSIS — R7303 Prediabetes: Secondary | ICD-10-CM | POA: Diagnosis not present

## 2021-04-25 DIAGNOSIS — Z853 Personal history of malignant neoplasm of breast: Secondary | ICD-10-CM | POA: Diagnosis not present

## 2021-04-25 DIAGNOSIS — Z96651 Presence of right artificial knee joint: Secondary | ICD-10-CM | POA: Diagnosis not present

## 2021-04-25 DIAGNOSIS — Z91040 Latex allergy status: Secondary | ICD-10-CM | POA: Diagnosis not present

## 2021-04-25 DIAGNOSIS — R531 Weakness: Secondary | ICD-10-CM | POA: Diagnosis not present

## 2021-04-25 DIAGNOSIS — M4712 Other spondylosis with myelopathy, cervical region: Secondary | ICD-10-CM | POA: Diagnosis not present

## 2021-04-25 DIAGNOSIS — E785 Hyperlipidemia, unspecified: Secondary | ICD-10-CM | POA: Diagnosis not present

## 2021-04-25 DIAGNOSIS — I10 Essential (primary) hypertension: Secondary | ICD-10-CM | POA: Diagnosis not present

## 2021-04-25 LAB — CBC WITH DIFFERENTIAL/PLATELET
Abs Immature Granulocytes: 0.05 10*3/uL (ref 0.00–0.07)
Basophils Absolute: 0.1 10*3/uL (ref 0.0–0.1)
Basophils Relative: 1 %
Eosinophils Absolute: 0.1 10*3/uL (ref 0.0–0.5)
Eosinophils Relative: 1 %
HCT: 46 % (ref 36.0–46.0)
Hemoglobin: 15.1 g/dL — ABNORMAL HIGH (ref 12.0–15.0)
Immature Granulocytes: 0 %
Lymphocytes Relative: 18 %
Lymphs Abs: 2.3 10*3/uL (ref 0.7–4.0)
MCH: 32.3 pg (ref 26.0–34.0)
MCHC: 32.8 g/dL (ref 30.0–36.0)
MCV: 98.3 fL (ref 80.0–100.0)
Monocytes Absolute: 0.8 10*3/uL (ref 0.1–1.0)
Monocytes Relative: 6 %
Neutro Abs: 9.6 10*3/uL — ABNORMAL HIGH (ref 1.7–7.7)
Neutrophils Relative %: 74 %
Platelets: 202 10*3/uL (ref 150–400)
RBC: 4.68 MIL/uL (ref 3.87–5.11)
RDW: 12.5 % (ref 11.5–15.5)
WBC: 12.9 10*3/uL — ABNORMAL HIGH (ref 4.0–10.5)
nRBC: 0 % (ref 0.0–0.2)

## 2021-04-25 LAB — BASIC METABOLIC PANEL
Anion gap: 8 (ref 5–15)
BUN: 18 mg/dL (ref 8–23)
CO2: 27 mmol/L (ref 22–32)
Calcium: 9.3 mg/dL (ref 8.9–10.3)
Chloride: 100 mmol/L (ref 98–111)
Creatinine, Ser: 0.74 mg/dL (ref 0.44–1.00)
GFR, Estimated: >60 mL/min (ref >60)
Glucose, Bld: 150 mg/dL — ABNORMAL HIGH (ref 70–99)
Potassium: 3.4 mmol/L — ABNORMAL LOW (ref 3.5–5.1)
Sodium: 135 mmol/L (ref 135–145)

## 2021-04-25 MED ORDER — APIXABAN 5 MG PO TABS: 5.0000 mg | ORAL_TABLET | Freq: Two times a day (BID) | ORAL | 0 refills | Status: DC

## 2021-04-25 NOTE — Care Management Obs Status (Signed)
[facility] NOTIFICATION   Patient Details  Name: [name] MRN: [id number] Date of Birth: 1946/07/20   Medicare Observation Status Notification Given:  Yes    Dawayne Patricia, RN 04/25/2021, 11:31 AM

## 2021-04-25 NOTE — Progress Notes (Signed)
Patient is doing well without complaints except for confusion seizures improved neck pain improved arms and legs.  Objective: Vital signs in last 24 hours: Temp:  [97.5 F (36.4 C)-98.5 F (36.9 C)] 97.7 F (36.5 C) (05/20 0755) Pulse Rate:  [50-71] 71 (05/20 0755) Resp:  [14-18] 17 (05/20 0755) BP: (110-162)/(53-69) 162/65 (05/20 0755) SpO2:  [95 %-98 %] 95 % (05/20 0755)  Intake/Output from previous day: No intake/output data recorded. Intake/Output this shift: No intake/output data recorded.  Awake alert oriented strength 5 out of 5 wound clean dry and intact  Lab Results: Recent Labs    04/23/21 1936  WBC 12.4*  HGB 15.3*  HCT 46.2*  PLT 157   BMET Recent Labs    04/23/21 1936  NA 136  K 3.8  CL 101  CO2 26  GLUCOSE 154*  BUN 12  CREATININE 0.67  CALCIUM 9.2    Studies/Results: DG Cervical Spine 1 View  Result Date: 04/23/2021 CLINICAL DATA:  Surgery, elective. Additional history provided: C4-6 ACDF. Provided fluoroscopy time 7 seconds (0.43 mGy). EXAM: DG CERVICAL SPINE - 1 VIEW; DG C-ARM 1-60 MIN COMPARISON:  Cervical spine MRI 12/27/2020. FINDINGS: A single lateral view intraoperative fluoroscopic image of the cervical spine is submitted. On the provided image, ACDF hardware is present at the C4-C6 levels (ventral plate and screws). Interbody devices are also present at C4-C5 and C5-C6. Curvilinear hyperdensity projects ventral to the cervical spine at the C5-C7 levels, likely reflecting a surgical sponge/packing material. Partially visualized ET tube. IMPRESSION: Single lateral view intraoperative fluoroscopic image of the cervical spine from C4-C6 ACDF, as described. Curvilinear hyperdensity projects ventral to the cervical spine at the C5-C7 levels, likely reflecting a surgical sponge/packing material. Correlate with the operative history. Electronically Signed   By: Kellie Simmering DO   On: 04/23/2021 13:35   DG C-Arm 1-60 Min  Result Date:  04/23/2021 CLINICAL DATA:  Surgery, elective. Additional history provided: C4-6 ACDF. Provided fluoroscopy time 7 seconds (0.43 mGy). EXAM: DG CERVICAL SPINE - 1 VIEW; DG C-ARM 1-60 MIN COMPARISON:  Cervical spine MRI 12/27/2020. FINDINGS: A single lateral view intraoperative fluoroscopic image of the cervical spine is submitted. On the provided image, ACDF hardware is present at the C4-C6 levels (ventral plate and screws). Interbody devices are also present at C4-C5 and C5-C6. Curvilinear hyperdensity projects ventral to the cervical spine at the C5-C7 levels, likely reflecting a surgical sponge/packing material. Partially visualized ET tube. IMPRESSION: Single lateral view intraoperative fluoroscopic image of the cervical spine from C4-C6 ACDF, as described. Curvilinear hyperdensity projects ventral to the cervical spine at the C5-C7 levels, likely reflecting a surgical sponge/packing material. Correlate with the operative history. Electronically Signed   By: Kellie Simmering DO   On: 04/23/2021 13:35   ECHOCARDIOGRAM COMPLETE  Result Date: 04/24/2021    ECHOCARDIOGRAM REPORT   Patient Name:   [name] Date of Exam: 04/24/2021 Medical Rec #:  [id number]           Height:       66.0 in Accession #:    [id number]          Weight:       166.0 lb Date of Birth:  December 13, 1945          BSA:          1.848 m Patient Age:    75 years            BP:  124/65 mmHg Patient Gender: F                   HR:           46 bpm. Exam Location:  Inpatient Procedure: 2D Echo, Cardiac Doppler and Color Doppler Indications:     Atrial flutter  History:         Patient has prior history of Echocardiogram examinations, most                  recent 12/19/2015. CAD; Risk Factors:Dyslipidemia and                  Hypertension.  Sonographer:     [place] Referring Phys:  [id number] Margie Billet Diagnosing Phys: Eleonore Chiquito MD  Sonographer Comments: 2012 CABGx4 plus stents 2013 cath IMPRESSIONS  1. Left ventricular  ejection fraction, by estimation, is 65 to 70%. The left ventricle has normal function. The left ventricle has no regional wall motion abnormalities. There is mild concentric left ventricular hypertrophy. Left ventricular diastolic function could not be evaluated.  2. Right ventricular systolic function is normal. The right ventricular size is normal. There is normal pulmonary artery systolic pressure. The estimated right ventricular systolic pressure is 08.6 mmHg.  3. The mitral valve is degenerative. Mild mitral valve regurgitation. No evidence of mitral stenosis.  4. The aortic valve is tricuspid. There is mild calcification of the aortic valve. There is mild thickening of the aortic valve. Aortic valve regurgitation is not visualized. Mild aortic valve sclerosis is present, with no evidence of aortic valve stenosis.  5. The inferior vena cava is normal in size with greater than 50% respiratory variability, suggesting right atrial pressure of 3 mmHg. Comparison(s): No significant change from prior study. LV function remains normal. Now in atrial flutter. FINDINGS  Left Ventricle: Left ventricular ejection fraction, by estimation, is 65 to 70%. The left ventricle has normal function. The left ventricle has no regional wall motion abnormalities. The left ventricular internal cavity size was normal in size. There is  mild concentric left ventricular hypertrophy. Left ventricular diastolic function could not be evaluated due to atrial fibrillation. Left ventricular diastolic function could not be evaluated. Right Ventricle: The right ventricular size is normal. No increase in right ventricular wall thickness. Right ventricular systolic function is normal. There is normal pulmonary artery systolic pressure. The tricuspid regurgitant velocity is 2.36 m/s, and  with an assumed right atrial pressure of 3 mmHg, the estimated right ventricular systolic pressure is 76.1 mmHg. Left Atrium: Left atrial size was normal in  size. Right Atrium: Right atrial size was normal in size. Pericardium: Trivial pericardial effusion is present. Presence of pericardial fat pad. Mitral Valve: The mitral valve is degenerative in appearance. Mild mitral annular calcification. Mild mitral valve regurgitation. No evidence of mitral valve stenosis. Tricuspid Valve: The tricuspid valve is grossly normal. Tricuspid valve regurgitation is mild . No evidence of tricuspid stenosis. Aortic Valve: The aortic valve is tricuspid. There is mild calcification of the aortic valve. There is mild thickening of the aortic valve. Aortic valve regurgitation is not visualized. Mild aortic valve sclerosis is present, with no evidence of aortic valve stenosis. Aortic valve mean gradient measures 6.0 mmHg. Aortic valve peak gradient measures 10.8 mmHg. Aortic valve area, by VTI measures 1.52 cm. Pulmonic Valve: The pulmonic valve was grossly normal. Pulmonic valve regurgitation is trivial. No evidence of pulmonic stenosis. Aorta: The aortic root and ascending aorta are structurally normal, with  no evidence of dilitation. Venous: The inferior vena cava is normal in size with greater than 50% respiratory variability, suggesting right atrial pressure of 3 mmHg. IAS/Shunts: The atrial septum is grossly normal.  LEFT VENTRICLE PLAX 2D LVIDd:         4.70 cm LVIDs:         3.00 cm LV PW:         1.20 cm LV IVS:        1.20 cm LVOT diam:     1.70 cm LV SV:         62 LV SV Index:   33 LVOT Area:     2.27 cm  LV Volumes (MOD) LV vol d, MOD A2C: 55.0 ml LV vol d, MOD A4C: 46.6 ml LV vol s, MOD A2C: 13.3 ml LV vol s, MOD A4C: 15.1 ml LV SV MOD A2C:     41.7 ml LV SV MOD A4C:     46.6 ml LV SV MOD BP:      36.2 ml RIGHT VENTRICLE RV S prime:     9.68 cm/s TAPSE (M-mode): 1.9 cm LEFT ATRIUM             Index       RIGHT ATRIUM           Index LA Vol (A2C):   41.3 ml 22.35 ml/m RA Area:     15.20 cm LA Vol (A4C):   42.5 ml 23.00 ml/m RA Volume:   33.20 ml  17.97 ml/m LA Biplane  Vol: 41.9 ml 22.68 ml/m  AORTIC VALVE                    PULMONIC VALVE AV Area (Vmax):    1.58 cm     PV Vmax:       0.96 m/s AV Area (Vmean):   1.52 cm     PV Vmean:      71.800 cm/s AV Area (VTI):     1.52 cm     PV VTI:        0.251 m AV Vmax:           164.00 cm/s  PV Peak grad:  3.7 mmHg AV Vmean:          109.000 cm/s PV Mean grad:  3.0 mmHg AV VTI:            0.406 m AV Peak Grad:      10.8 mmHg AV Mean Grad:      6.0 mmHg LVOT Vmax:         114.00 cm/s LVOT Vmean:        73.200 cm/s LVOT VTI:          0.272 m LVOT/AV VTI ratio: 0.67  AORTA Ao Root diam: 2.90 cm Ao Asc diam:  3.40 cm MV E velocity: 101.00 cm/s  TRICUSPID VALVE                             TR Peak grad:   22.3 mmHg                             TR Vmax:        236.00 cm/s  SHUNTS                             Systemic VTI:  0.27 m                             Systemic Diam: 1.70 cm Eleonore Chiquito MD Electronically signed by Eleonore Chiquito MD Signature Date/Time: 04/24/2021/3:27:23 PM    Final (Updated)     Assessment/Plan: Continue mobilization with physical therapy we will check electrolytes discontinue all of her pain medication muscle relaxers and any psychotropic medications.  Await cardiology determination of echocardiogram but if confusion resolves works well with physical therapy and patient feels up to it she can be discharged this afternoon.  LOS: 0 days  Probable discharge this afternoon.   [name] 04/25/2021, 8:13 AM

## 2021-04-25 NOTE — Progress Notes (Addendum)
Progress Note  Patient Name: [name] Date of Encounter: 04/25/2021  [facility] Cardiologist: Candee Furbish, MD   Subjective   Patient is ambulating with staff in the room. She states she feel well, wishes to go home, denied any chest pain, SOB, heart palpitation or dizziness.   Inpatient Medications    Scheduled Meds: . aspirin EC  81 mg Oral Daily  . atenolol  50 mg Oral Daily  . B-complex with vitamin C  1 tablet Oral QPM  . multivitamin with minerals  1 tablet Oral Daily  . pantoprazole  40 mg Oral Daily  . potassium chloride  2.4 mEq Oral QHS  . rosuvastatin  20 mg Oral QHS  . sodium chloride flush  3 mL Intravenous Q12H   Continuous Infusions: . sodium chloride     PRN Meds: acetaminophen **OR** acetaminophen, alum & mag hydroxide-simeth, furosemide, menthol-cetylpyridinium **OR** phenol, ondansetron **OR** ondansetron (ZOFRAN) IV, sodium chloride flush   Vital Signs    Vitals:   04/24/21 1957 04/25/21 0000 04/25/21 0400 04/25/21 0755  BP: (!) 138/55 138/60 (!) 152/69 (!) 162/65  Pulse: (!) 52 (!) 50 62 71  Resp: 18 14 14 17   Temp: 98.2 F (36.8 C) 98.2 F (36.8 C) (!) 97.5 F (36.4 C) 97.7 F (36.5 C)  TempSrc: Oral Oral Oral Oral  SpO2: 96% 98% 97% 95%  Weight:      Height:       No intake or output data in the 24 hours ending 04/25/21 1007 Last 3 Weights 04/23/2021 04/21/2021 02/19/2021  Weight (lbs) 166 lb 0.1 oz 166 lb 170 lb  Weight (kg) 75.3 kg 75.297 kg 77.111 kg      Telemetry    Atrial flutter/ fibrillation with variable AV conduction, ventricular rate of 50-80s - Personally Reviewed  ECG    No new tracing today  - Personally Reviewed  Physical Exam    GEN: No acute distress.  Sitting in chair.  Neck: Cervical collar in place, not removed for exam of neck  Cardiac: Irregularly irregular, no murmurs, rubs, or gallops.  Respiratory: Clear to auscultation bilaterally. On Room air  GI: Soft, nontender, non-distended  MS: BLE  no edema; No deformity. Neuro:  Alert and oriented x3,  carry conversation appopriately Psych: Normal affect   Labs    High Sensitivity Troponin:  No results for input(s): TROPONINIHS in the last 720 hours.    Chemistry Recent Labs  Lab 04/21/21 1335 04/23/21 1936  NA 139 136  K 3.5 3.8  CL 103 101  CO2 26 26  GLUCOSE 106* 154*  BUN 19 12  CREATININE 0.61 0.67  CALCIUM 9.6 9.2  GFRNONAA >60 >60  ANIONGAP 10 9     Hematology Recent Labs  Lab 04/21/21 1335 04/23/21 1936  WBC 9.0 12.4*  RBC 4.75 4.75  HGB 14.9 15.3*  HCT 46.2* 46.2*  MCV 97.3 97.3  MCH 31.4 32.2  MCHC 32.3 33.1  RDW 12.5 12.1  PLT 210 157    BNPNo results for input(s): BNP, PROBNP in the last 168 hours.   DDimer No results for input(s): DDIMER in the last 168 hours.   Radiology    DG Cervical Spine 1 View  Result Date: 04/23/2021 CLINICAL DATA:  Surgery, elective. Additional history provided: C4-6 ACDF. Provided fluoroscopy time 7 seconds (0.43 mGy). EXAM: DG CERVICAL SPINE - 1 VIEW; DG C-ARM 1-60 MIN COMPARISON:  Cervical spine MRI 12/27/2020. FINDINGS: A single lateral view intraoperative fluoroscopic image of  the cervical spine is submitted. On the provided image, ACDF hardware is present at the C4-C6 levels (ventral plate and screws). Interbody devices are also present at C4-C5 and C5-C6. Curvilinear hyperdensity projects ventral to the cervical spine at the C5-C7 levels, likely reflecting a surgical sponge/packing material. Partially visualized ET tube. IMPRESSION: Single lateral view intraoperative fluoroscopic image of the cervical spine from C4-C6 ACDF, as described. Curvilinear hyperdensity projects ventral to the cervical spine at the C5-C7 levels, likely reflecting a surgical sponge/packing material. Correlate with the operative history. Electronically Signed   By: Kellie Simmering DO   On: 04/23/2021 13:35   DG C-Arm 1-60 Min  Result Date: 04/23/2021 CLINICAL DATA:  Surgery, elective.  Additional history provided: C4-6 ACDF. Provided fluoroscopy time 7 seconds (0.43 mGy). EXAM: DG CERVICAL SPINE - 1 VIEW; DG C-ARM 1-60 MIN COMPARISON:  Cervical spine MRI 12/27/2020. FINDINGS: A single lateral view intraoperative fluoroscopic image of the cervical spine is submitted. On the provided image, ACDF hardware is present at the C4-C6 levels (ventral plate and screws). Interbody devices are also present at C4-C5 and C5-C6. Curvilinear hyperdensity projects ventral to the cervical spine at the C5-C7 levels, likely reflecting a surgical sponge/packing material. Partially visualized ET tube. IMPRESSION: Single lateral view intraoperative fluoroscopic image of the cervical spine from C4-C6 ACDF, as described. Curvilinear hyperdensity projects ventral to the cervical spine at the C5-C7 levels, likely reflecting a surgical sponge/packing material. Correlate with the operative history. Electronically Signed   By: Kellie Simmering DO   On: 04/23/2021 13:35   ECHOCARDIOGRAM COMPLETE  Result Date: 04/24/2021    ECHOCARDIOGRAM REPORT   Patient Name:   [name] Date of Exam: 04/24/2021 Medical Rec #:  [id number]           Height:       66.0 in Accession #:    [id number]          Weight:       166.0 lb Date of Birth:  1946/06/17          BSA:          1.848 m Patient Age:    37 years            BP:           124/65 mmHg Patient Gender: F                   HR:           46 bpm. Exam Location:  Inpatient Procedure: 2D Echo, Cardiac Doppler and Color Doppler Indications:     Atrial flutter  History:         Patient has prior history of Echocardiogram examinations, most                  recent 12/19/2015. CAD; Risk Factors:Dyslipidemia and                  Hypertension.  Sonographer:     [place] Referring Phys:  [id number] Margie Billet Diagnosing Phys: Eleonore Chiquito MD  Sonographer Comments: 2012 CABGx4 plus stents 2013 cath IMPRESSIONS  1. Left ventricular ejection fraction, by estimation, is 65 to 70%. The  left ventricle has normal function. The left ventricle has no regional wall motion abnormalities. There is mild concentric left ventricular hypertrophy. Left ventricular diastolic function could not be evaluated.  2. Right ventricular systolic function is normal. The right ventricular size is normal. There is normal pulmonary artery systolic  pressure. The estimated right ventricular systolic pressure is 44.0 mmHg.  3. The mitral valve is degenerative. Mild mitral valve regurgitation. No evidence of mitral stenosis.  4. The aortic valve is tricuspid. There is mild calcification of the aortic valve. There is mild thickening of the aortic valve. Aortic valve regurgitation is not visualized. Mild aortic valve sclerosis is present, with no evidence of aortic valve stenosis.  5. The inferior vena cava is normal in size with greater than 50% respiratory variability, suggesting right atrial pressure of 3 mmHg. Comparison(s): No significant change from prior study. LV function remains normal. Now in atrial flutter. FINDINGS  Left Ventricle: Left ventricular ejection fraction, by estimation, is 65 to 70%. The left ventricle has normal function. The left ventricle has no regional wall motion abnormalities. The left ventricular internal cavity size was normal in size. There is  mild concentric left ventricular hypertrophy. Left ventricular diastolic function could not be evaluated due to atrial fibrillation. Left ventricular diastolic function could not be evaluated. Right Ventricle: The right ventricular size is normal. No increase in right ventricular wall thickness. Right ventricular systolic function is normal. There is normal pulmonary artery systolic pressure. The tricuspid regurgitant velocity is 2.36 m/s, and  with an assumed right atrial pressure of 3 mmHg, the estimated right ventricular systolic pressure is 10.2 mmHg. Left Atrium: Left atrial size was normal in size. Right Atrium: Right atrial size was normal in  size. Pericardium: Trivial pericardial effusion is present. Presence of pericardial fat pad. Mitral Valve: The mitral valve is degenerative in appearance. Mild mitral annular calcification. Mild mitral valve regurgitation. No evidence of mitral valve stenosis. Tricuspid Valve: The tricuspid valve is grossly normal. Tricuspid valve regurgitation is mild . No evidence of tricuspid stenosis. Aortic Valve: The aortic valve is tricuspid. There is mild calcification of the aortic valve. There is mild thickening of the aortic valve. Aortic valve regurgitation is not visualized. Mild aortic valve sclerosis is present, with no evidence of aortic valve stenosis. Aortic valve mean gradient measures 6.0 mmHg. Aortic valve peak gradient measures 10.8 mmHg. Aortic valve area, by VTI measures 1.52 cm. Pulmonic Valve: The pulmonic valve was grossly normal. Pulmonic valve regurgitation is trivial. No evidence of pulmonic stenosis. Aorta: The aortic root and ascending aorta are structurally normal, with no evidence of dilitation. Venous: The inferior vena cava is normal in size with greater than 50% respiratory variability, suggesting right atrial pressure of 3 mmHg. IAS/Shunts: The atrial septum is grossly normal.  LEFT VENTRICLE PLAX 2D LVIDd:         4.70 cm LVIDs:         3.00 cm LV PW:         1.20 cm LV IVS:        1.20 cm LVOT diam:     1.70 cm LV SV:         62 LV SV Index:   33 LVOT Area:     2.27 cm  LV Volumes (MOD) LV vol d, MOD A2C: 55.0 ml LV vol d, MOD A4C: 46.6 ml LV vol s, MOD A2C: 13.3 ml LV vol s, MOD A4C: 15.1 ml LV SV MOD A2C:     41.7 ml LV SV MOD A4C:     46.6 ml LV SV MOD BP:      36.2 ml RIGHT VENTRICLE RV S prime:     9.68 cm/s TAPSE (M-mode): 1.9 cm LEFT ATRIUM  Index       RIGHT ATRIUM           Index LA Vol (A2C):   41.3 ml 22.35 ml/m RA Area:     15.20 cm LA Vol (A4C):   42.5 ml 23.00 ml/m RA Volume:   33.20 ml  17.97 ml/m LA Biplane Vol: 41.9 ml 22.68 ml/m  AORTIC VALVE                     PULMONIC VALVE AV Area (Vmax):    1.58 cm     PV Vmax:       0.96 m/s AV Area (Vmean):   1.52 cm     PV Vmean:      71.800 cm/s AV Area (VTI):     1.52 cm     PV VTI:        0.251 m AV Vmax:           164.00 cm/s  PV Peak grad:  3.7 mmHg AV Vmean:          109.000 cm/s PV Mean grad:  3.0 mmHg AV VTI:            0.406 m AV Peak Grad:      10.8 mmHg AV Mean Grad:      6.0 mmHg LVOT Vmax:         114.00 cm/s LVOT Vmean:        73.200 cm/s LVOT VTI:          0.272 m LVOT/AV VTI ratio: 0.67  AORTA Ao Root diam: 2.90 cm Ao Asc diam:  3.40 cm MV E velocity: 101.00 cm/s  TRICUSPID VALVE                             TR Peak grad:   22.3 mmHg                             TR Vmax:        236.00 cm/s                              SHUNTS                             Systemic VTI:  0.27 m                             Systemic Diam: 1.70 cm Eleonore Chiquito MD Electronically signed by Eleonore Chiquito MD Signature Date/Time: 04/24/2021/3:27:23 PM    Final (Updated)     Cardiac Studies   Echo is pending from today    Patient Profile      Norlene Crisol Muecke is a 75 y.o. female with a hx right breat cancer, HTN, CAD s/p CABG in 2012and PCI to LCx in 3/ 2013, first degree AV block, hyperlipidemia, cervical myopathy, of who is followed by cardiology for postop atrial flutter at the request of Dr. Saintclair Halsted.  Assessment & Plan    Post-operative atrial flutter/atrial fibrillation with controlled rate Hx of first degree AV block  - no prior hx of afib/aflutter - appears occurred intraoperatively 5/18 (<48 hours) - CHA2DS2VAScscore is 4 due to age, gender, HTN, CAD - rate is controlled at this time on  home dose atenolol 50mg  daily  - TSH WNL , optimized electrolyte  -  Echo 04/24/21 showed EF 65-70%, no RWMA, mild concentric LVH, RV normal, mild MR, mild aortic sclerosis  - unable to start anticoagulation currently due to immediate postop anterior cervical discectomy and fusion at C4-5 and C5-6, neurosurgery cleared for  anticoagulation 72 hours post surgery, recommend to start apixaban 5 mg twice daily on Sunday, May 22 and discontinue ASA 81mg  at discharge  - follow up appointment is arranged with [facility] on 05/15/21, plan for DCCV after 3 weeks of uninterrupted anticoagulation, unless rhythm resolved spontaneously   CAD - s/p CABG in 2012 and PCI to LCx in 3/ 2013 - last cath 2013 showed patent LIMA to LAD stent but SVG to obtuse marginal, SVG to PDA, SVG to diagonal are occluded.  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 Available data reviewed. Agree with findings, assessment, and plan as outlined by Margie Billet, NP.  The patient is alert, oriented, in no distress.  Lungs are clear, heart is irregular with no murmur or gallop, extremities have no edema.  Skin is warm and dry.  Abdomen is soft and nontender.  The patient is seen just after walking with physical therapy and her heart rate is in the 70s with ambulation.  She is stable from a cardiovascular perspective for hospital discharge.  Yesterday's echocardiogram is reviewed and shows normal LV and RV function with no significant valvular disease.  She should discontinue aspirin with last dose 5/21 and transition to apixaban 5 mg twice daily starting 5/22.  Follow-up arranged in the [facility] for  [facility].  Please call if any questions.  Sherren Mocha, M.D. 04/25/2021 11:02 AM

## 2021-04-25 NOTE — Progress Notes (Signed)
Physical Therapy Treatment Patient Details Name: [name] MRN: [id number] DOB: [birth year] Today's Date: 04/25/2021    History of Present Illness The pt is a 75 yo female presenting 5/18 for ACDF of C4-6 due to c/o neck pain with numbness, tingling, and weakness in her hands. PMH includes: CAD s/p CABG in 2012, breast cancer, HTN, HLD, obesity, post-concussion syndrome, and R TKA.    PT Comments    The pt was seen for continued progression of ambulation, and independence with OOB transfers. The pt was able to demo improvement in transfers, requiring minA only to power up initially, and minG once given cues to stand with increased use of forward momentum to improve forward movement of trunk. The pt was able to complete 150 ft ambulation in hallway with use of RW, but no LOB at this time. MinA at times to avoid running into obstacles, difficulty with visual scanning due to decreased vision and cervical precautions. Pt and her spouse educated at length regarding improved technique for sit-stand transfers for improved pt safety and increased forward trunk movement to improve power up. Continue to recommend skilled PT acutely and following d/c to maximize return of pt strength, endurance, and safety with independence.    Follow Up Recommendations  Home health PT;Supervision for mobility/OOB     Equipment Recommendations  Rolling walker with 5" wheels    Recommendations for Other Services       Precautions / Restrictions Precautions Precautions: Fall;Cervical Precaution Booklet Issued: Yes (comment) Precaution Comments: discussed, pt needing verbal cues to apply to all mobility Required Braces or Orthoses: Cervical Brace Cervical Brace: Soft collar Restrictions Weight Bearing Restrictions: No    Mobility  Bed Mobility Overal bed mobility: Needs Assistance             General bed mobility comments: pt sitting in recliner at start and end of session     Transfers Overall transfer level: Needs assistance Equipment used: Rolling walker (2 wheeled) Transfers: Sit to/from Stand Sit to Stand: Min assist;Min guard         General transfer comment: minA initially to power up from standing, initially with difficulty generating forward movement, given cues for single UE on RW and single UE on recliner with improved forward translation and minG for safety  Ambulation/Gait Ambulation/Gait assistance: Min guard Gait Distance (Feet): 150 Feet Assistive device: Rolling walker (2 wheeled) Gait Pattern/deviations: Step-through pattern;Decreased stride length;Trunk flexed Gait velocity: 0.44 m/s Gait velocity interpretation: <1.8 ft/sec, indicate of risk for recurrent falls General Gait Details: cues for posture, proximity to RW, pt needing minG for safety and intermittent assist with managing RW to avoid obstacles         Balance Overall balance assessment: History of Falls Sitting-balance support: No upper extremity supported;Feet supported Sitting balance-Leahy Scale: Good     Standing balance support: No upper extremity supported;During functional activity Standing balance-Leahy Scale: Fair Standing balance comment: able to static stand without UE support, BUE support for dynamic stance                            Cognition Arousal/Alertness: Awake/alert Behavior During Therapy: WFL for tasks assessed/performed Overall Cognitive Status: History of cognitive impairments - at baseline                                 General Comments: Pt with history of concussion and is  slow to process info, and requires intermittent cues for carry over of info      Exercises Other Exercises Other Exercises: walking marches, x10 with each LE    General Comments General comments (skin integrity, edema, etc.): VSS on RA      Pertinent Vitals/Pain Pain Assessment: Faces Faces Pain Scale: Hurts a little bit Pain  Location: neck/incision Pain Descriptors / Indicators: Discomfort;Sore Pain Intervention(s): Monitored during session;Repositioned           PT Goals (current goals can now be found in the care plan section) Acute Rehab PT Goals Patient Stated Goal: to have less pain PT Goal Formulation: With patient/family Time For Goal Achievement: 05/08/21 Potential to Achieve Goals: Good Progress towards PT goals: Progressing toward goals    Frequency    Min 5X/week      PT Plan Current plan remains appropriate       AM-PAC PT "6 Clicks" Mobility   Outcome Measure  Help needed turning from your back to your side while in a flat bed without using bedrails?: A Little Help needed moving from lying on your back to sitting on the side of a flat bed without using bedrails?: A Little Help needed moving to and from a bed to a chair (including a wheelchair)?: A Little Help needed standing up from a chair using your arms (e.g., wheelchair or bedside chair)?: A Little Help needed to walk in hospital room?: A Little Help needed climbing 3-5 steps with a railing? : A Lot 6 Click Score: 17    End of Session Equipment Utilized During Treatment: Gait belt;Cervical collar Activity Tolerance: No increased pain;Patient tolerated treatment well Patient left: with call bell/phone within reach;with family/visitor present;in chair;with chair alarm set Nurse Communication: Mobility status PT Visit Diagnosis: Other abnormalities of gait and mobility (R26.89);History of falling (Z91.81)     Time: 3976-7341 PT Time Calculation (min) (ACUTE ONLY): 26 min  Charges:  $Gait Training: 23-37 mins                     Karma Ganja, PT, DPT   Acute Rehabilitation Department Pager #: [phone]    Otho Bellows 04/25/2021, 11:24 AM

## 2021-04-25 NOTE — TOC Transition Note (Signed)
Transition of Care (TOC) - CM/SW Discharge Note Marvetta Gibbons RN,BSN Transitions of Care Unit 4NP (non trauma) - RN Case Manager See Treatment Team for direct Phone #   Patient Details  Name: [name] MRN: [id number] Date of Birth: May 02, 1946  Transition of Care [facility]) CM/SW Contact:  Dawayne Patricia, RN Phone Number: 04/25/2021, 2:15 PM   Clinical Narrative:    Pt stable for transition home today, CM spoke with pt and spouse this am regarding transition needs for [facility] and DME. Choice offered for [facility] Per CMS guidelines from medicare.gov website with star ratings (copy placed in shadow chart), they have selected [facility], [facility], [facility], [facility] as first choices for [facility] needs. Pt has walker at home but it is not a standard RW- so will arrange for a RW to be delivered to room- they are agreeable to using in house provider- [facility].  Address, phone # and PCP all confirmed in epic. Spouse Mobile # is alternate.   Call made to [facility] for [facility] referral- unable to accept [facility] called - and able to accept for HHPT needs- per Angie they should be able to do a start of care tomorrow.   Call made to [facility] with [facility] for DME- RW to be delivered to room prior to discharge.     Final next level of care: [place] Barriers to Discharge: No Barriers Identified   Patient Goals and CMS Choice Patient states their goals for this hospitalization and ongoing recovery are:: return home CMS Medicare.gov Compare Post Acute Care list provided to:: Patient Choice offered to / list presented to : Patient,Spouse  Discharge Placement                home with [facility]        Discharge Plan and Services   Discharge Planning Services: CM Consult Post Acute Care Choice: Home Health,Durable Medical Equipment          DME Arranged: Walker rolling DME Agency: [facility] Date DME Agency Contacted: 04/25/21 Time DME Agency Contacted: 1200 Representative spoke with at  DME Agency: Freda Munro [place]: PT [place]: [place] Date [place]: 04/25/21 Time [place]: 1400 Representative spoke with at [place]: [place] ([place]) Interventions     Readmission Risk Interventions No flowsheet data found.

## 2021-04-25 NOTE — Progress Notes (Signed)
Pt discharged home with home health. DME walker brought to bedside. All room belongings packed and PIV removed. Pt's husband at the bedside. Pt educated on discharge summary and RN reinforced for pt to start Eliquis 5/22, per MD. Pt escorted to private vehicle in wheelchair with all belongings.   Justice Rocher, RN

## 2021-04-25 NOTE — Discharge Summary (Signed)
Physician Discharge Summary  Patient ID: [name] MRN: [id number] DOB/AGE: 1946/04/07 75 y.o. Estimated body mass index is 26.79 kg/m as calculated from the following:   Height as of this encounter: 5\' 6"  (1.676 m).   Weight as of this encounter: 75.3 kg.   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  On the floor she was put on a monitored bed evaluated by cardiology seen by physical therapy and by hospital day 2 was stable for discharge home with scheduled follow-up in 1 to 2 weeks with cardiology and with neurosurgery recommended to start Eliquis on Sunday the 22nd 5 days after surgery.  Patient will be discharged with those instructions and schedule follow-up  Consults: Significant Diagnostic Studies: Treatments: ACDF C4-5 C5-6 Discharge Exam: Blood pressure 108/70, pulse 66, temperature 98 F (36.7 C), temperature source Oral, resp. rate 14, height 5\' 6"  (1.676 m), weight 75.3 kg, SpO2 95 %. Awake alert strength 5/5 wound clean dry and intact  Disposition: Home  Discharge Instructions    Face-to-face encounter (required for Medicare/Medicaid patients)   Complete by: As directed    I Elaina Hoops certify that this patient is under my care and that I, or a nurse practitioner or physician's assistant working with me, had a face-to-face encounter that meets the physician face-to-face encounter requirements with this patient on 04/25/2021. The encounter with the patient was in whole, or in part for the  following medical condition(s) which is the primary reason for home health care (List medical condition): Cervical myelopathy   The encounter with the patient was in whole, or in part, for the following medical condition, which is the primary reason for home health care: Cervical myelopathy   I certify that, based on my findings, the following services are medically necessary home health services: Physical therapy   Reason for Medically Necessary Home Health Services: Therapy- Personnel officer, Public librarian   My clinical findings support the need for the above services: Unable to leave home safely without assistance and/or assistive device   Further, I certify that my clinical findings support that this patient is homebound due to: Unable to leave home safely without assistance   Home Health   Complete by: As directed    To provide the following care/treatments: PT     Allergies as of 04/25/2021      Reactions   Norvasc [amlodipine Besylate] Other (See Comments)   Edema   Latex Itching, Rash   Lipitor [atorvastatin] Other (See Comments)   Muscle aches   Lisinopril Swelling, Cough   Penicillins Rash   Tolerated Ancef 2g IV on 02/19/21   Sulfa Antibiotics Rash   Tape Itching, Rash   Tetanus Toxoids Other (See Comments)   Patient said "Didn't do well".      Medication List    STOP taking these medications   aspirin EC 81 MG tablet   HYDROcodone-acetaminophen 5-325 MG tablet Commonly known as: NORCO/VICODIN   ibuprofen 200 MG tablet Commonly known as: ADVIL     TAKE these medications   acetaminophen 500 MG  tablet Commonly known as: TYLENOL Take 500-1,000 mg by mouth every 6 (six) hours as needed (pain).   apixaban 5 MG Tabs tablet Commonly known as: ELIQUIS Take 1 tablet (5 mg total) by mouth 2 (two) times daily. Start taking on: Apr 27, 2021   atenolol 50 MG tablet Commonly known as: TENORMIN TAKE 1 TABLET BY MOUTH EVERY DAY   B-complex with  vitamin C tablet Take 1 tablet by mouth every evening.   furosemide 20 MG tablet Commonly known as: LASIX TAKE 1 TABLET BY MOUTH EVERY DAY AS NEEDED What changed: when to take this   ketoconazole 2 % cream Commonly known as: NIZORAL Apply 1 application topically daily.   multivitamin with minerals Tabs tablet Take 1 tablet by mouth daily.   Potassium Gluconate 550 (90 K) MG Tabs Take 550 mg by mouth every evening.   rosuvastatin 20 MG tablet Commonly known as: CRESTOR TAKE 1 TABLET BY MOUTH EVERY DAY What changed: when to take this            Durable Medical Equipment  (From admission, onward)         Start     Ordered   04/25/21 1142  For home use only DME Walker rolling  Once       Question Answer Comment  Walker: With [place] Wheels   Patient needs a walker to treat with the following condition Weakness generalized      04/25/21 1141          Follow-up Information    Sherran Needs, NP Follow up on 05/15/2021.   Specialties: Nurse Practitioner, Cardiology Why: at 1:30 PM for your atrial fibrillation/flutter follow up  Contact information: [place] [address] [phone]               Signed: Elaina Hoops 04/25/2021, 2:00 PM

## 2021-04-25 NOTE — Progress Notes (Signed)
Occupational Therapy Treatment Patient Details Name: [name] MRN: [id number] DOB: [birth year] Today's Date: 04/25/2021    History of present illness The pt is a 75 yo female presenting 5/18 for ACDF of C4-6 due to c/o neck pain with numbness, tingling, and weakness in her hands. PMH includes: CAD s/p CABG in 2012, breast cancer, HTN, HLD, obesity, post-concussion syndrome, and R TKA.   OT comments  Pt requires min A for LB ADLs using AE and min guard assist for functional mobility.  She requires increased time to process info, and cues for precautions, but requires less cuing than yesterday.  Pt's spouse present and reports he feels confident assisting her at home.    Follow Up Recommendations  No OT follow up;Supervision - Intermittent    Equipment Recommendations  3 in 1 bedside commode    Recommendations for Other Services      Precautions / Restrictions Precautions Precautions: Fall;Cervical Precaution Booklet Issued: Yes (comment) Precaution Comments: requires cues for cervical precautions Required Braces or Orthoses: Cervical Brace Cervical Brace: Soft collar       Mobility Bed Mobility               General bed mobility comments: pt sitting in recliner at start and end of session    Transfers Overall transfer level: Needs assistance Equipment used: Rolling walker (2 wheeled) Transfers: Sit to/from [facility] Sit to Stand: Min guard Stand pivot transfers: Min guard       General transfer comment: pt requires multiple attempts to achieve standing    Balance Overall balance assessment: History of Falls Sitting-balance support: No upper extremity supported;Feet supported Sitting balance-Leahy Scale: Good     Standing balance support: No upper extremity supported;During functional activity Standing balance-Leahy Scale: Fair                             ADL either performed or assessed with clinical judgement    ADL Overall ADL's : Needs assistance/impaired                     Lower Body Dressing: Minimal assistance;Sit to/from stand;With adaptive equipment Lower Body Dressing Details (indicate cue type and reason): Pt requires min cues for safety and cervical precautions Toilet Transfer: Min guard;Ambulation;Comfort height toilet;BSC;Grab bars;RW Armed forces technical officer Details (indicate cue type and reason): verbal cues for hand placement Toileting- Clothing Manipulation and Hygiene: Min guard;Sit to/from stand       Functional mobility during ADLs: Min guard General ADL Comments: pt's spouse present and reports he feels comfortable assisting pt at home     Vision       Perception     Praxis      Cognition Arousal/Alertness: Awake/alert Behavior During Therapy: WFL for tasks assessed/performed Overall Cognitive Status: History of cognitive impairments - at baseline                                 General Comments: Pt with history of concussion and is slow to process info, and requires intermittent cues for carry over of info        Exercises     Shoulder Instructions       General Comments      Pertinent Vitals/ Pain       Pain Assessment: No/denies pain  Home Living  Prior Functioning/Environment              Frequency  Min 2X/week        Progress Toward Goals  OT Goals(current goals can now be found in the care plan section)  Progress towards OT goals: Progressing toward goals     Plan Discharge plan remains appropriate    Co-evaluation                 AM-PAC OT "6 Clicks" Daily Activity     Outcome Measure   Help from another person eating meals?: None Help from another person taking care of personal grooming?: A Little Help from another person toileting, which includes using toliet, bedpan, or urinal?: A Little Help from another person bathing (including  washing, rinsing, drying)?: A Little Help from another person to put on and taking off regular upper body clothing?: A Little Help from another person to put on and taking off regular lower body clothing?: A Little 6 Click Score: 19    End of Session Equipment Utilized During Treatment: Rolling walker  OT Visit Diagnosis: Unsteadiness on feet (R26.81);Pain;Cognitive communication deficit (R41.841)   Activity Tolerance Patient tolerated treatment well   Patient Left in chair;with call bell/phone within reach;with chair alarm set;with family/visitor present   Nurse Communication Mobility status        Time: 6073-7106 OT Time Calculation (min): 43 min  Charges: OT General Charges $OT Visit: 1 Visit OT Treatments $Self Care/Home Management : 38-52 mins  Nilsa Nutting., OTR/L Acute Rehabilitation Services Pager [phone] Office [phone]    Lucille Passy M 04/25/2021, 3:10 PM

## 2021-04-26 DIAGNOSIS — Z4789 Encounter for other orthopedic aftercare: Secondary | ICD-10-CM | POA: Diagnosis not present

## 2021-04-26 DIAGNOSIS — Z981 Arthrodesis status: Secondary | ICD-10-CM | POA: Diagnosis not present

## 2021-04-26 DIAGNOSIS — I4892 Unspecified atrial flutter: Secondary | ICD-10-CM | POA: Diagnosis not present

## 2021-04-26 DIAGNOSIS — M50021 Cervical disc disorder at C4-C5 level with myelopathy: Secondary | ICD-10-CM | POA: Diagnosis not present

## 2021-04-26 DIAGNOSIS — I10 Essential (primary) hypertension: Secondary | ICD-10-CM | POA: Diagnosis not present

## 2021-04-26 DIAGNOSIS — D0511 Intraductal carcinoma in situ of right breast: Secondary | ICD-10-CM | POA: Diagnosis not present

## 2021-04-26 DIAGNOSIS — Z9181 History of falling: Secondary | ICD-10-CM | POA: Diagnosis not present

## 2021-04-26 DIAGNOSIS — I2510 Atherosclerotic heart disease of native coronary artery without angina pectoris: Secondary | ICD-10-CM | POA: Diagnosis not present

## 2021-04-26 DIAGNOSIS — M50022 Cervical disc disorder at C5-C6 level with myelopathy: Secondary | ICD-10-CM | POA: Diagnosis not present

## 2021-04-26 DIAGNOSIS — G9589 Other specified diseases of spinal cord: Secondary | ICD-10-CM | POA: Diagnosis not present

## 2021-04-30 DIAGNOSIS — Z9181 History of falling: Secondary | ICD-10-CM | POA: Diagnosis not present

## 2021-04-30 DIAGNOSIS — I2510 Atherosclerotic heart disease of native coronary artery without angina pectoris: Secondary | ICD-10-CM | POA: Diagnosis not present

## 2021-04-30 DIAGNOSIS — M50022 Cervical disc disorder at C5-C6 level with myelopathy: Secondary | ICD-10-CM | POA: Diagnosis not present

## 2021-04-30 DIAGNOSIS — D0511 Intraductal carcinoma in situ of right breast: Secondary | ICD-10-CM | POA: Diagnosis not present

## 2021-04-30 DIAGNOSIS — I4892 Unspecified atrial flutter: Secondary | ICD-10-CM | POA: Diagnosis not present

## 2021-04-30 DIAGNOSIS — I10 Essential (primary) hypertension: Secondary | ICD-10-CM | POA: Diagnosis not present

## 2021-04-30 DIAGNOSIS — Z981 Arthrodesis status: Secondary | ICD-10-CM | POA: Diagnosis not present

## 2021-04-30 DIAGNOSIS — M50021 Cervical disc disorder at C4-C5 level with myelopathy: Secondary | ICD-10-CM | POA: Diagnosis not present

## 2021-04-30 DIAGNOSIS — Z4789 Encounter for other orthopedic aftercare: Secondary | ICD-10-CM | POA: Diagnosis not present

## 2021-05-01 DIAGNOSIS — Z981 Arthrodesis status: Secondary | ICD-10-CM | POA: Diagnosis not present

## 2021-05-01 DIAGNOSIS — I4892 Unspecified atrial flutter: Secondary | ICD-10-CM | POA: Diagnosis not present

## 2021-05-01 DIAGNOSIS — Z9181 History of falling: Secondary | ICD-10-CM | POA: Diagnosis not present

## 2021-05-01 DIAGNOSIS — Z4789 Encounter for other orthopedic aftercare: Secondary | ICD-10-CM | POA: Diagnosis not present

## 2021-05-01 DIAGNOSIS — D0511 Intraductal carcinoma in situ of right breast: Secondary | ICD-10-CM | POA: Diagnosis not present

## 2021-05-01 DIAGNOSIS — I2510 Atherosclerotic heart disease of native coronary artery without angina pectoris: Secondary | ICD-10-CM | POA: Diagnosis not present

## 2021-05-01 DIAGNOSIS — M50021 Cervical disc disorder at C4-C5 level with myelopathy: Secondary | ICD-10-CM | POA: Diagnosis not present

## 2021-05-01 DIAGNOSIS — I10 Essential (primary) hypertension: Secondary | ICD-10-CM | POA: Diagnosis not present

## 2021-05-01 DIAGNOSIS — M50022 Cervical disc disorder at C5-C6 level with myelopathy: Secondary | ICD-10-CM | POA: Diagnosis not present

## 2021-05-02 DIAGNOSIS — M50021 Cervical disc disorder at C4-C5 level with myelopathy: Secondary | ICD-10-CM | POA: Diagnosis not present

## 2021-05-02 DIAGNOSIS — D0511 Intraductal carcinoma in situ of right breast: Secondary | ICD-10-CM | POA: Diagnosis not present

## 2021-05-02 DIAGNOSIS — I10 Essential (primary) hypertension: Secondary | ICD-10-CM | POA: Diagnosis not present

## 2021-05-02 DIAGNOSIS — M50022 Cervical disc disorder at C5-C6 level with myelopathy: Secondary | ICD-10-CM | POA: Diagnosis not present

## 2021-05-02 DIAGNOSIS — I4892 Unspecified atrial flutter: Secondary | ICD-10-CM | POA: Diagnosis not present

## 2021-05-02 DIAGNOSIS — Z981 Arthrodesis status: Secondary | ICD-10-CM | POA: Diagnosis not present

## 2021-05-02 DIAGNOSIS — Z4789 Encounter for other orthopedic aftercare: Secondary | ICD-10-CM | POA: Diagnosis not present

## 2021-05-02 DIAGNOSIS — I2510 Atherosclerotic heart disease of native coronary artery without angina pectoris: Secondary | ICD-10-CM | POA: Diagnosis not present

## 2021-05-02 DIAGNOSIS — Z9181 History of falling: Secondary | ICD-10-CM | POA: Diagnosis not present

## 2021-05-05 DIAGNOSIS — D0511 Intraductal carcinoma in situ of right breast: Secondary | ICD-10-CM | POA: Diagnosis not present

## 2021-05-05 DIAGNOSIS — M50021 Cervical disc disorder at C4-C5 level with myelopathy: Secondary | ICD-10-CM | POA: Diagnosis not present

## 2021-05-05 DIAGNOSIS — Z981 Arthrodesis status: Secondary | ICD-10-CM | POA: Diagnosis not present

## 2021-05-05 DIAGNOSIS — I2510 Atherosclerotic heart disease of native coronary artery without angina pectoris: Secondary | ICD-10-CM | POA: Diagnosis not present

## 2021-05-05 DIAGNOSIS — I4892 Unspecified atrial flutter: Secondary | ICD-10-CM | POA: Diagnosis not present

## 2021-05-05 DIAGNOSIS — I10 Essential (primary) hypertension: Secondary | ICD-10-CM | POA: Diagnosis not present

## 2021-05-05 DIAGNOSIS — Z9181 History of falling: Secondary | ICD-10-CM | POA: Diagnosis not present

## 2021-05-05 DIAGNOSIS — M50022 Cervical disc disorder at C5-C6 level with myelopathy: Secondary | ICD-10-CM | POA: Diagnosis not present

## 2021-05-05 DIAGNOSIS — Z4789 Encounter for other orthopedic aftercare: Secondary | ICD-10-CM | POA: Diagnosis not present

## 2021-05-06 ENCOUNTER — Telehealth: Payer: Medicare HMO | Admitting: Oncology

## 2021-05-07 DIAGNOSIS — I4892 Unspecified atrial flutter: Secondary | ICD-10-CM | POA: Diagnosis not present

## 2021-05-07 DIAGNOSIS — D0511 Intraductal carcinoma in situ of right breast: Secondary | ICD-10-CM | POA: Diagnosis not present

## 2021-05-07 DIAGNOSIS — M50022 Cervical disc disorder at C5-C6 level with myelopathy: Secondary | ICD-10-CM | POA: Diagnosis not present

## 2021-05-07 DIAGNOSIS — Z981 Arthrodesis status: Secondary | ICD-10-CM | POA: Diagnosis not present

## 2021-05-07 DIAGNOSIS — I2510 Atherosclerotic heart disease of native coronary artery without angina pectoris: Secondary | ICD-10-CM | POA: Diagnosis not present

## 2021-05-07 DIAGNOSIS — Z4789 Encounter for other orthopedic aftercare: Secondary | ICD-10-CM | POA: Diagnosis not present

## 2021-05-07 DIAGNOSIS — I10 Essential (primary) hypertension: Secondary | ICD-10-CM | POA: Diagnosis not present

## 2021-05-07 DIAGNOSIS — Z9181 History of falling: Secondary | ICD-10-CM | POA: Diagnosis not present

## 2021-05-07 DIAGNOSIS — M50021 Cervical disc disorder at C4-C5 level with myelopathy: Secondary | ICD-10-CM | POA: Diagnosis not present

## 2021-05-14 ENCOUNTER — Telehealth: Payer: Self-pay | Admitting: Cardiology

## 2021-05-14 DIAGNOSIS — D0511 Intraductal carcinoma in situ of right breast: Secondary | ICD-10-CM | POA: Diagnosis not present

## 2021-05-14 DIAGNOSIS — I4892 Unspecified atrial flutter: Secondary | ICD-10-CM | POA: Diagnosis not present

## 2021-05-14 DIAGNOSIS — Z9181 History of falling: Secondary | ICD-10-CM | POA: Diagnosis not present

## 2021-05-14 DIAGNOSIS — Z981 Arthrodesis status: Secondary | ICD-10-CM | POA: Diagnosis not present

## 2021-05-14 DIAGNOSIS — I2510 Atherosclerotic heart disease of native coronary artery without angina pectoris: Secondary | ICD-10-CM | POA: Diagnosis not present

## 2021-05-14 DIAGNOSIS — M50022 Cervical disc disorder at C5-C6 level with myelopathy: Secondary | ICD-10-CM | POA: Diagnosis not present

## 2021-05-14 DIAGNOSIS — Z4789 Encounter for other orthopedic aftercare: Secondary | ICD-10-CM | POA: Diagnosis not present

## 2021-05-14 DIAGNOSIS — M50021 Cervical disc disorder at C4-C5 level with myelopathy: Secondary | ICD-10-CM | POA: Diagnosis not present

## 2021-05-14 DIAGNOSIS — I10 Essential (primary) hypertension: Secondary | ICD-10-CM | POA: Diagnosis not present

## 2021-05-14 NOTE — Telephone Encounter (Signed)
Pt c/o swelling: STAT is pt has developed SOB within 24 hours  1) How much weight have you gained and in what time span? Since Monday 05/12/21 pt has gained 4lbs  2) If swelling, where is the swelling located? Bilateral legs/feet and akles  3) Are you currently taking a fluid pill? yes  4) Are you currently SOB? NO  5) Do you have a log of your daily weights (if so, list)? No   6) Have you gained 3 pounds in a day or 5 pounds in a week? 4lbs since 05/12/21  7) Have you traveled recently? No not to her knowledge  Sharee Pimple  From [facility] is calling to make Dr. Marlou Porch aware [phone]

## 2021-05-14 NOTE — Telephone Encounter (Signed)
Pt states she has been having issues with edema in her feet and ankles since Monday.  Weight Monday was 162lbs, today 166lbs.  Denies other symptoms or increased salt in diet.  Wore compression stockings all day Monday.  States she took them off Tuesday because they were "driving me crazy" and were tight around her ankles.  No improvement with elevation.  BP today was 127/70 and pt states this is pretty normal for her.  Med list shows pt is to take Furosemide 20mg  QD PRN.  Pt states she has been taking this daily for over a year.  Advised I will send message to Dr. Marlou Porch for review and advisement.

## 2021-05-15 ENCOUNTER — Other Ambulatory Visit: Payer: Self-pay

## 2021-05-15 ENCOUNTER — Ambulatory Visit (HOSPITAL_COMMUNITY)
Admission: RE | Admit: 2021-05-15 | Discharge: 2021-05-15 | Disposition: A | Discharge status: Home / Self Care | Payer: Medicare HMO | Source: Ambulatory Visit | Attending: Nurse Practitioner | Admitting: Nurse Practitioner

## 2021-05-15 VITALS — BP 148/72 | HR 67 | Ht 66.0 in | Wt 166.0 lb

## 2021-05-15 DIAGNOSIS — Z8249 Family history of ischemic heart disease and other diseases of the circulatory system: Secondary | ICD-10-CM | POA: Insufficient documentation

## 2021-05-15 DIAGNOSIS — R600 Localized edema: Secondary | ICD-10-CM | POA: Diagnosis not present

## 2021-05-15 DIAGNOSIS — Z951 Presence of aortocoronary bypass graft: Secondary | ICD-10-CM | POA: Insufficient documentation

## 2021-05-15 DIAGNOSIS — Z882 Allergy status to sulfonamides status: Secondary | ICD-10-CM | POA: Insufficient documentation

## 2021-05-15 DIAGNOSIS — I480 Paroxysmal atrial fibrillation: Secondary | ICD-10-CM

## 2021-05-15 DIAGNOSIS — I10 Essential (primary) hypertension: Secondary | ICD-10-CM | POA: Diagnosis not present

## 2021-05-15 DIAGNOSIS — Z853 Personal history of malignant neoplasm of breast: Secondary | ICD-10-CM | POA: Diagnosis not present

## 2021-05-15 DIAGNOSIS — D6869 Other thrombophilia: Secondary | ICD-10-CM

## 2021-05-15 DIAGNOSIS — Z888 Allergy status to other drugs, medicaments and biological substances status: Secondary | ICD-10-CM | POA: Insufficient documentation

## 2021-05-15 DIAGNOSIS — Z880 Allergy status to penicillin: Secondary | ICD-10-CM | POA: Diagnosis not present

## 2021-05-15 DIAGNOSIS — E785 Hyperlipidemia, unspecified: Secondary | ICD-10-CM | POA: Insufficient documentation

## 2021-05-15 DIAGNOSIS — I2510 Atherosclerotic heart disease of native coronary artery without angina pectoris: Secondary | ICD-10-CM | POA: Insufficient documentation

## 2021-05-15 DIAGNOSIS — Z7901 Long term (current) use of anticoagulants: Secondary | ICD-10-CM | POA: Insufficient documentation

## 2021-05-15 DIAGNOSIS — Z79899 Other long term (current) drug therapy: Secondary | ICD-10-CM | POA: Diagnosis not present

## 2021-05-15 HISTORY — DX: Other thrombophilia: D68.69

## 2021-05-15 HISTORY — DX: Paroxysmal atrial fibrillation: I48.0

## 2021-05-15 NOTE — Telephone Encounter (Signed)
Reviewed information with Dr Marlou Porch who gives verbal order for pt to increase Furosemide to 40 mg po BID X 3 days then return to 20 mg daily as needed.  Advised to take 1st dose in the AM and 2nd dose appr 6 hours later.  Pt states understanding and denied any further question.  Also denied needing new RX - stating  "I have plenty."  Pt will call back if no improvement.

## 2021-05-15 NOTE — Progress Notes (Addendum)
Primary Care Physician: Orpah Melter, MD Primary Cardiologist: Dr Marlou Porch Primary Electrophysiologist: none Referring Physician: Dr Gunnar Fusi [name] is a 75 y.o. female with a history of breast cancer, HTN, CAD s/p CABG 2012, HLD, and atrial fibrillation who presents for consultation in the [facility].  The patient was initially diagnosed with atrial fibrillation 04/23/21 postoperatively following a cervical discectomy and fusion. She was rate controlled on her home dose of atenolol. Patient is on Eliquis for a CHADS2VASC score of 4. Patient is back in SR today. She denies any bleeding issues on anticoagulation.   Today, she denies symptoms of palpitations, chest pain, shortness of breath, orthopnea, PND, dizziness, presyncope, syncope, snoring, daytime somnolence, bleeding, or neurologic sequela. The patient is tolerating medications without difficulties and is otherwise without complaint today.    Atrial Fibrillation Risk Factors:  she does not have symptoms or diagnosis of sleep apnea. she does not have a history of rheumatic fever.   she has a BMI of Body mass index is 26.79 kg/m.Marland Kitchen Filed Weights   05/15/21 1325  Weight: 75.3 kg    Family History  Problem Relation Age of Onset   Heart disease Mother    Hypertension Mother    Heart attack Father    Heart disease Brother    Heart disease Brother    Breast cancer Paternal Grandmother        dx unknown age   Breast cancer Paternal Aunt        dx unknown age     Atrial Fibrillation Management history:  Previous antiarrhythmic drugs: none Previous cardioversions: none Previous ablations: none CHADS2VASC score: 4 Anticoagulation history: Eliquis   Past Medical History:  Diagnosis Date   Allergic rhinitis    Breast cancer ([place]) 10/17/2020   CAD (coronary artery disease)    , s/p CAGB 01/2011- LIMA to LAD patent, all other vein grafts occluded. Circumflex DES 3/13 placed  following lateral ischemia on nuclear stress test   Cervical spondylosis    , bulging  discs   Disc herniation    L4-L5, L5-S1   Family history of breast cancer 11/18/2020   Hypertension    Mixed hyperlipidemia    Obesity    Post concussion syndrome    processes information slowly,    Postmenopausal    Prediabetes    Past Surgical History:  Procedure Laterality Date   ABDOMINAL HYSTERECTOMY     ANTERIOR CERVICAL DECOMP/DISCECTOMY FUSION N/A 04/23/2021   Procedure: Anterior Cervical Discectomy Fusion - Cervical Four- Cervical Five - Cervical Five -Cervical Six;  Surgeon: Kary Kos, MD;  Location: [facility];  Service: Neurosurgery;  Laterality: N/A;  Anterior Cervical Discectomy Fusion - Cervical Four- Cervical Five - Cervical Five -Cervical Six   Bone Spur Left    Foot   BREAST BIOPSY     BREAST ENHANCEMENT SURGERY     BREAST IMPLANT EXCHANGE     replaced rupture implant   BREAST LUMPECTOMY WITH RADIOACTIVE SEED LOCALIZATION Right 12/11/2020   Procedure: RIGHT BREAST LUMPECTOMY WITH RADIOACTIVE SEED LOCALIZATION;  Surgeon: Jovita Kussmaul, MD;  Location: [facility];  Service: General;  Laterality: Right;   CARDIAC CATHETERIZATION     COLONOSCOPY     CORONARY ARTERY BYPASS GRAFT     JOINT REPLACEMENT     right total knee replacement   Left Bunionectomy     LEFT HEART CATHETERIZATION WITH CORONARY/GRAFT ANGIOGRAM  02/05/2012   Procedure: LEFT HEART CATHETERIZATION WITH  Beatrix Fetters;  Surgeon: Candee Furbish, MD;  Location: [facility] CATH LAB;  Service: Cardiovascular;;   MASTECTOMY W/ SENTINEL NODE BIOPSY Right 02/19/2021   Procedure: RIGHT MASTECTOMY;  Surgeon: Jovita Kussmaul, MD;  Location: [facility];  Service: General;  Laterality: Right;  RNFA, PEC BLOCK   PARTIAL HYSTERECTOMY     RE-EXCISION OF BREAST CANCER,SUPERIOR MARGINS Right 01/15/2021   Procedure: RE-EXCISION RIGHT BREAST MEDIAL MARGIN;  Surgeon: Jovita Kussmaul, MD;  Location: [facility];  Service:  General;  Laterality: Right;   REPLACEMENT TOTAL KNEE Right    SENTINEL NODE BIOPSY Right 02/19/2021   Procedure: SENTINEL LYMPH NODE BIOPSY;  Surgeon: Jovita Kussmaul, MD;  Location: [facility];  Service: General;  Laterality: Right;   TONSILLECTOMY     TUBAL LIGATION Bilateral     Current Outpatient Medications  Medication Sig Dispense Refill   acetaminophen (TYLENOL) 500 MG tablet Take 500-1,000 mg by mouth every 6 (six) hours as needed (pain).     apixaban (ELIQUIS) 5 MG TABS tablet Take 1 tablet (5 mg total) by mouth 2 (two) times daily. 60 tablet 0   atenolol (TENORMIN) 50 MG tablet TAKE 1 TABLET BY MOUTH EVERY DAY (Patient taking differently: Take 50 mg by mouth daily.) 90 tablet 3   B Complex-C-E-Zn (EQL STRESS B-COMPLEX C/ZINC) TABS take 1 tablet by oral route every day     furosemide (LASIX) 20 MG tablet TAKE 1 TABLET BY MOUTH EVERY DAY AS NEEDED (Patient taking differently: Take 20 mg by mouth daily.) 90 tablet 3   ibuprofen (ADVIL) 200 MG tablet 1 tablet with food or milk as needed     ketoconazole (NIZORAL) 2 % cream Apply 1 application topically daily. 15 g 4   Multiple Vitamin (MULTIVITAMIN WITH MINERALS) TABS tablet Take 1 tablet by mouth daily.     Potassium Gluconate 550 MG TABS take 1 tablet by oral route every day     rosuvastatin (CRESTOR) 20 MG tablet TAKE 1 TABLET BY MOUTH EVERY DAY (Patient taking differently: Take 20 mg by mouth every evening.) 90 tablet 3   No current facility-administered medications for this encounter.    Allergies  Allergen Reactions   Norvasc [Amlodipine Besylate] Other (See Comments)    Edema    Latex Itching and Rash   Lipitor [Atorvastatin] Other (See Comments)    Muscle aches   Lisinopril Swelling and Cough   Penicillins Rash    Tolerated Ancef 2g IV on 02/19/21   Sulfa Antibiotics Rash   Tape Itching and Rash   Tetanus Toxoids Other (See Comments)    Patient said "Didn't do well".    Social History   Socioeconomic History    Marital status: Married    Spouse name: Not on file   Number of children: Not on file   Years of education: Not on file   Highest education level: Not on file  Occupational History   Not on file  Tobacco Use   Smoking status: Never   Smokeless tobacco: Never  Vaping Use   Vaping Use: Never used  Substance and Sexual Activity   Alcohol use: Not Currently    Comment: Occasionally   Drug use: No   Sexual activity: Not on file  Other Topics Concern   Not on file  Social History Narrative   Right handed   Drinks cafffeine   2 story home   Social Determinants of Health   Financial Resource Strain: Not on file  Food Insecurity:  Not on file  Transportation Needs: Not on file  Physical Activity: Not on file  Stress: Not on file  Social Connections: Not on file  Intimate Partner Violence: Not At Risk   Fear of Current or Ex-Partner: No   Emotionally Abused: No   Physically Abused: No   Sexually Abused: No     ROS- All systems are reviewed and negative except as per the HPI above.  Physical Exam: Vitals:   05/15/21 1325  BP: (!) 148/72  Pulse: 67  Weight: 75.3 kg  Height: 5\' 6"  (1.676 m)    GEN- The patient is a well appearing elderly female, alert and oriented x 3 today.   Head- normocephalic, atraumatic Eyes-  Sclera clear, conjunctiva pink Ears- hearing intact Oropharynx- clear Neck- supple  Lungs- Clear to ausculation bilaterally, normal work of breathing Heart- Regular rate and rhythm, no murmurs, rubs or gallops  GI- soft, NT, ND, + BS Extremities- no clubbing, cyanosis. 1+ lower extremity edema MS- no significant deformity or atrophy Skin- no rash or lesion Psych- euthymic mood, full affect Neuro- strength and sensation are intact  Wt Readings from Last 3 Encounters:  05/15/21 75.3 kg  04/23/21 75.3 kg  04/21/21 75.3 kg    EKG today demonstrates  SR, 1st degree AV block Vent. rate 67 BPM PR interval 216 ms QRS duration 84 ms QT/QTcB 432/456  ms  Echo 04/24/21 demonstrated   1. Left ventricular ejection fraction, by estimation, is 65 to 70%. The  left ventricle has normal function. The left ventricle has no regional  wall motion abnormalities. There is mild concentric left ventricular  hypertrophy. Left ventricular diastolic  function could not be evaluated.   2. Right ventricular systolic function is normal. The right ventricular  size is normal. There is normal pulmonary artery systolic pressure. The  estimated right ventricular systolic pressure is 12.4 mmHg.   3. The mitral valve is degenerative. Mild mitral valve regurgitation. No  evidence of mitral stenosis.   4. The aortic valve is tricuspid. There is mild calcification of the  aortic valve. There is mild thickening of the aortic valve. Aortic valve  regurgitation is not visualized. Mild aortic valve sclerosis is present,  with no evidence of aortic valve  stenosis.   5. The inferior vena cava is normal in size with greater than 50%  respiratory variability, suggesting right atrial pressure of 3 mmHg.   Comparison(s): No significant change from prior study. LV function remains  normal. Now in atrial flutter.   Epic records are reviewed at length today  CHA2DS2-VASc Score = 4  The patient's score is based upon: CHF History: No HTN History: Yes Diabetes History: No Stroke History: No Vascular Disease History: Yes Age Score: 1 Gender Score: 1      ASSESSMENT AND PLAN: 1. Paroxysmal Atrial Fibrillation The patient's CHA2DS2-VASc score is 4, indicating a 4.8% annual risk of stroke.   Patient has converted spontaneously back to SR. No need for DCCV.  Continue atenolol 50 mg daily Continue Eliquis 5 mg BID  2. Secondary Hypercoagulable State (ICD10:  D68.69) The patient is at significant risk for stroke/thromboembolism based upon her CHA2DS2-VASc Score of 4.  Continue Apixaban (Eliquis).   3. CAD S/p CABG No anginal symptoms.  4. HTN Stable, no changes  today.  5. Lower extremity edema Patient to increase Lasix per Dr Marlou Porch instructions.    Follow up with Dr Marlou Porch per recall.    [facility]  [place], [place] [address] [phone] 05/15/2021 2:15 PM

## 2021-05-15 NOTE — Addendum Note (Signed)
Encounter addended by: Oliver Barre, PA on: 05/15/2021 2:17 PM  Actions taken: Clinical Note Signed

## 2021-05-16 DIAGNOSIS — I4892 Unspecified atrial flutter: Secondary | ICD-10-CM | POA: Diagnosis not present

## 2021-05-16 DIAGNOSIS — Z981 Arthrodesis status: Secondary | ICD-10-CM | POA: Diagnosis not present

## 2021-05-16 DIAGNOSIS — I2510 Atherosclerotic heart disease of native coronary artery without angina pectoris: Secondary | ICD-10-CM | POA: Diagnosis not present

## 2021-05-16 DIAGNOSIS — I10 Essential (primary) hypertension: Secondary | ICD-10-CM | POA: Diagnosis not present

## 2021-05-16 DIAGNOSIS — Z4789 Encounter for other orthopedic aftercare: Secondary | ICD-10-CM | POA: Diagnosis not present

## 2021-05-16 DIAGNOSIS — M50022 Cervical disc disorder at C5-C6 level with myelopathy: Secondary | ICD-10-CM | POA: Diagnosis not present

## 2021-05-16 DIAGNOSIS — D0511 Intraductal carcinoma in situ of right breast: Secondary | ICD-10-CM | POA: Diagnosis not present

## 2021-05-16 DIAGNOSIS — Z9181 History of falling: Secondary | ICD-10-CM | POA: Diagnosis not present

## 2021-05-16 DIAGNOSIS — M50021 Cervical disc disorder at C4-C5 level with myelopathy: Secondary | ICD-10-CM | POA: Diagnosis not present

## 2021-05-20 DIAGNOSIS — M50021 Cervical disc disorder at C4-C5 level with myelopathy: Secondary | ICD-10-CM | POA: Diagnosis not present

## 2021-05-20 DIAGNOSIS — I2510 Atherosclerotic heart disease of native coronary artery without angina pectoris: Secondary | ICD-10-CM | POA: Diagnosis not present

## 2021-05-20 DIAGNOSIS — Z9181 History of falling: Secondary | ICD-10-CM | POA: Diagnosis not present

## 2021-05-20 DIAGNOSIS — Z981 Arthrodesis status: Secondary | ICD-10-CM | POA: Diagnosis not present

## 2021-05-20 DIAGNOSIS — I10 Essential (primary) hypertension: Secondary | ICD-10-CM | POA: Diagnosis not present

## 2021-05-20 DIAGNOSIS — I4892 Unspecified atrial flutter: Secondary | ICD-10-CM | POA: Diagnosis not present

## 2021-05-20 DIAGNOSIS — M50022 Cervical disc disorder at C5-C6 level with myelopathy: Secondary | ICD-10-CM | POA: Diagnosis not present

## 2021-05-20 DIAGNOSIS — D0511 Intraductal carcinoma in situ of right breast: Secondary | ICD-10-CM | POA: Diagnosis not present

## 2021-05-20 DIAGNOSIS — Z4789 Encounter for other orthopedic aftercare: Secondary | ICD-10-CM | POA: Diagnosis not present

## 2021-05-22 ENCOUNTER — Telehealth: Payer: Self-pay | Admitting: Cardiology

## 2021-05-22 MED ORDER — APIXABAN 5 MG PO TABS: 5.0000 mg | ORAL_TABLET | Freq: Two times a day (BID) | ORAL | 4 refills | Status: DC

## 2021-05-22 NOTE — Telephone Encounter (Signed)
Pt c/o medication issue:  1. Name of Medication: apixaban (ELIQUIS) 5 MG TABS tablet  2. How are you currently taking this medication (dosage and times per day)? Patient is not sure, she could not find the bottle  3. Are you having a reaction (difficulty breathing--STAT)? no  4. What is your medication issue? Patient states she has no refills and would like to know if she is supposed to stay on the eliquis or switch to aspirin.

## 2021-05-22 NOTE — Telephone Encounter (Signed)
Spoke with patient. Advised per Adline Peals note she is to continue Eliquis. Refill sent. Advised her to avoid IBU and NSAIDS- use APAP for pain.

## 2021-05-23 DIAGNOSIS — M50021 Cervical disc disorder at C4-C5 level with myelopathy: Secondary | ICD-10-CM | POA: Diagnosis not present

## 2021-05-23 DIAGNOSIS — D0511 Intraductal carcinoma in situ of right breast: Secondary | ICD-10-CM | POA: Diagnosis not present

## 2021-05-23 DIAGNOSIS — Z9181 History of falling: Secondary | ICD-10-CM | POA: Diagnosis not present

## 2021-05-23 DIAGNOSIS — Z4789 Encounter for other orthopedic aftercare: Secondary | ICD-10-CM | POA: Diagnosis not present

## 2021-05-23 DIAGNOSIS — Z981 Arthrodesis status: Secondary | ICD-10-CM | POA: Diagnosis not present

## 2021-05-23 DIAGNOSIS — I2510 Atherosclerotic heart disease of native coronary artery without angina pectoris: Secondary | ICD-10-CM | POA: Diagnosis not present

## 2021-05-23 DIAGNOSIS — I10 Essential (primary) hypertension: Secondary | ICD-10-CM | POA: Diagnosis not present

## 2021-05-23 DIAGNOSIS — M50022 Cervical disc disorder at C5-C6 level with myelopathy: Secondary | ICD-10-CM | POA: Diagnosis not present

## 2021-05-23 DIAGNOSIS — I4892 Unspecified atrial flutter: Secondary | ICD-10-CM | POA: Diagnosis not present

## 2021-05-27 DIAGNOSIS — G959 Disease of spinal cord, unspecified: Secondary | ICD-10-CM | POA: Diagnosis not present

## 2021-06-01 NOTE — Progress Notes (Deleted)
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  *** Postconcussion syndrome.  ***  Subjective:  [name] is a 75 year old right-handed female with  Recently diagnosed noninvasive breast cancer who follows up for postconcussion syndrome ***.  Accompanied by her husband.   UPDATE: Due to hyperreflexia, she had an MRI of the cervical spine on 12/27/2020 which showed degenerative changes causing spinal stenosis from C3-4 through C5-6.  She underwent C4-5, C5-6 ACDF on 04/23/2021.  ***  HISTORY: On 06/30/2020, she tripped and fell in the bathroom at [place], hitting her head on the bathroom stall sustaining a scalp laceration.  She was seen in Urgent Care where she and her spouse reported that she did not lose consciousness and denied headache, blurred vision, nausea, vomiting, dizziness or lightheadedness.  When she returned to Urgent Care on 07/10/2020 for suture removal, she still denied headache. However, she has reported lightheadedness and feeling dizzy particularly in changing positions.  She is therefore scared to drive.  She reports problems with memory, having to write things down.  She reports word-finding issues and speech is slurred.  She also has trouble writing.  Handwriting is smaller.  It takes prolonged period of time to write things down.  She is still able to pay the bills but takes a while to complete task.  She also cries easily.  She has been using a cane as she has been unsteady. There has been no improvement in symptoms but they have not gotten worse either.     MRI of brain with and without contrast on 09/06/2020 personally reviewed showed mild generalized cerebral atrophy and chronic small vessel ischemic changes but no acute intracranial abnormality.   She hit her head after tripping on the driveway in 1941 but no concussion or LOC with that event.  PAST MEDICAL HISTORY: Past Medical  History:  Diagnosis Date   Allergic rhinitis    Breast cancer ([place]) 10/17/2020   CAD (coronary artery disease)    , s/p CAGB 01/2011- LIMA to LAD patent, all other vein grafts occluded. Circumflex DES 3/13 placed following lateral ischemia on nuclear stress test   Cervical spondylosis    , bulging  discs   Disc herniation    L4-L5, L5-S1   Family history of breast cancer 11/18/2020   Hypertension    Mixed hyperlipidemia    Obesity    Post concussion syndrome    processes information slowly,    Postmenopausal    Prediabetes     MEDICATIONS: Current Outpatient Medications on File Prior to Visit  Medication Sig Dispense Refill   acetaminophen (TYLENOL) 500 MG tablet Take 500-1,000 mg by mouth every 6 (six) hours as needed (pain).     apixaban (ELIQUIS) 5 MG TABS tablet Take 1 tablet (5 mg total) by mouth 2 (two) times daily. 60 tablet 4   atenolol (TENORMIN) 50 MG tablet TAKE 1 TABLET BY MOUTH EVERY DAY (Patient taking differently: Take 50 mg by mouth daily.) 90 tablet 3   B Complex-C-E-Zn (EQL STRESS B-COMPLEX C/ZINC) TABS take 1 tablet by oral route every day     furosemide (LASIX) 20 MG tablet TAKE 1 TABLET BY MOUTH EVERY DAY AS NEEDED (Patient taking differently: Take 20 mg by mouth daily.) 90 tablet 3   ibuprofen (ADVIL) 200 MG tablet 1 tablet with food or milk as needed     ketoconazole (NIZORAL) 2 % cream Apply  1 application topically daily. 15 g 4   Multiple Vitamin (MULTIVITAMIN WITH MINERALS) TABS tablet Take 1 tablet by mouth daily.     Potassium Gluconate 550 MG TABS take 1 tablet by oral route every day     rosuvastatin (CRESTOR) 20 MG tablet TAKE 1 TABLET BY MOUTH EVERY DAY (Patient taking differently: Take 20 mg by mouth every evening.) 90 tablet 3   No current facility-administered medications on file prior to visit.    ALLERGIES: Allergies  Allergen Reactions   Norvasc [Amlodipine Besylate] Other (See Comments)    Edema    Latex Itching and Rash   Lipitor  [Atorvastatin] Other (See Comments)    Muscle aches   Lisinopril Swelling and Cough   Penicillins Rash    Tolerated Ancef 2g IV on 02/19/21   Sulfa Antibiotics Rash   Tape Itching and Rash   Tetanus Toxoids Other (See Comments)    Patient said "Didn't do well".    FAMILY HISTORY: Family History  Problem Relation Age of Onset   Heart disease Mother    Hypertension Mother    Heart attack Father    Heart disease Brother    Heart disease Brother    Breast cancer Paternal Grandmother        dx unknown age   Breast cancer Paternal Aunt        dx unknown age      Objective:  *** General: No acute distress.  Patient appears well-groomed.   Head:  Normocephalic/atraumatic Eyes:  Fundi examined but not visualized Neck: supple, no paraspinal tenderness, full range of motion Heart:  Regular rate and rhythm Lungs:  Clear to auscultation bilaterally Back: No paraspinal tenderness Neurological Exam: alert and oriented to person, place, and time.  Speech fluent and not dysarthric, language intact.  CN II-XII intact. Bulk and tone normal, muscle strength 5/5 throughout.  Sensation to light touch intact.  Deep tendon reflexes 2+ throughout.  Finger to nose testing intact.  Gait normal, Romberg negative.   Metta Clines, DO  CC: Orpah Melter, MD

## 2021-06-02 ENCOUNTER — Ambulatory Visit: Payer: Medicare HMO | Admitting: Neurology

## 2021-06-03 NOTE — Progress Notes (Deleted)
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  *** Postconcussion syndrome.  ***  Subjective:  [name] is a 75 year old right-handed female with  Recently diagnosed noninvasive breast cancer who follows up for postconcussion syndrome ***.  Accompanied by her husband.   UPDATE: Due to hyperreflexia, she had an MRI of the cervical spine on 12/27/2020 which showed degenerative changes causing spinal stenosis from C3-4 through C5-6.  She underwent C4-5, C5-6 ACDF on 04/23/2021.  ***  HISTORY: On 06/30/2020, she tripped and fell in the bathroom at church, hitting her head on the bathroom stall sustaining a scalp laceration.  She was seen in Urgent Care where she and her spouse reported that she did not lose consciousness and denied headache, blurred vision, nausea, vomiting, dizziness or lightheadedness.  When she returned to Urgent Care on 07/10/2020 for suture removal, she still denied headache. However, she has reported lightheadedness and feeling dizzy particularly in changing positions.  She is therefore scared to drive.  She reports problems with memory, having to write things down.  She reports word-finding issues and speech is slurred.  She also has trouble writing.  Handwriting is smaller.  It takes prolonged period of time to write things down.  She is still able to pay the bills but takes a while to complete task.  She also cries easily.  She has been using a cane as she has been unsteady. There has been no improvement in symptoms but they have not gotten worse either.     MRI of brain with and without contrast on 09/06/2020 personally reviewed showed mild generalized cerebral atrophy and chronic small vessel ischemic changes but no acute intracranial abnormality.   She hit her head after tripping on the driveway in 1443 but no concussion or LOC with that event.  PAST MEDICAL HISTORY: Past Medical  History:  Diagnosis Date   Allergic rhinitis    Breast cancer ([place]) 10/17/2020   CAD (coronary artery disease)    , s/p CAGB 01/2011- LIMA to LAD patent, all other vein grafts occluded. Circumflex DES 3/13 placed following lateral ischemia on nuclear stress test   Cervical spondylosis    , bulging  discs   Disc herniation    L4-L5, L5-S1   Family history of breast cancer 11/18/2020   Hypertension    Mixed hyperlipidemia    Obesity    Post concussion syndrome    processes information slowly,    Postmenopausal    Prediabetes     MEDICATIONS: Current Outpatient Medications on File Prior to Visit  Medication Sig Dispense Refill   acetaminophen (TYLENOL) 500 MG tablet Take 500-1,000 mg by mouth every 6 (six) hours as needed (pain).     apixaban (ELIQUIS) 5 MG TABS tablet Take 1 tablet (5 mg total) by mouth 2 (two) times daily. 60 tablet 4   atenolol (TENORMIN) 50 MG tablet TAKE 1 TABLET BY MOUTH EVERY DAY (Patient taking differently: Take 50 mg by mouth daily.) 90 tablet 3   B Complex-C-E-Zn (EQL STRESS B-COMPLEX C/ZINC) TABS take 1 tablet by oral route every day     furosemide (LASIX) 20 MG tablet TAKE 1 TABLET BY MOUTH EVERY DAY AS NEEDED (Patient taking differently: Take 20 mg by mouth daily.) 90 tablet 3   ibuprofen (ADVIL) 200 MG tablet 1 tablet with food or milk as needed     ketoconazole (NIZORAL) 2 % cream Apply  1 application topically daily. 15 g 4   Multiple Vitamin (MULTIVITAMIN WITH MINERALS) TABS tablet Take 1 tablet by mouth daily.     Potassium Gluconate 550 MG TABS take 1 tablet by oral route every day     rosuvastatin (CRESTOR) 20 MG tablet TAKE 1 TABLET BY MOUTH EVERY DAY (Patient taking differently: Take 20 mg by mouth every evening.) 90 tablet 3   No current facility-administered medications on file prior to visit.    ALLERGIES: Allergies  Allergen Reactions   Norvasc [Amlodipine Besylate] Other (See Comments)    Edema    Latex Itching and Rash   Lipitor  [Atorvastatin] Other (See Comments)    Muscle aches   Lisinopril Swelling and Cough   Penicillins Rash    Tolerated Ancef 2g IV on 02/19/21   Sulfa Antibiotics Rash   Tape Itching and Rash   Tetanus Toxoids Other (See Comments)    Patient said "Didn't do well".    FAMILY HISTORY: Family History  Problem Relation Age of Onset   Heart disease Mother    Hypertension Mother    Heart attack Father    Heart disease Brother    Heart disease Brother    Breast cancer Paternal Grandmother        dx unknown age   Breast cancer Paternal Aunt        dx unknown age      Objective:  *** General: No acute distress.  Patient appears well-groomed.   Head:  Normocephalic/atraumatic Eyes:  Fundi examined but not visualized Neck: supple, no paraspinal tenderness, full range of motion Heart:  Regular rate and rhythm Lungs:  Clear to auscultation bilaterally Back: No paraspinal tenderness Neurological Exam: alert and oriented to person, place, and time.  Speech fluent and not dysarthric, language intact.  CN II-XII intact. Bulk and tone normal, muscle strength 5/5 throughout.  Sensation to light touch intact.  Deep tendon reflexes 2+ throughout.  Finger to nose testing intact.  Gait normal, Romberg negative.   Metta Clines, DO  CC: Orpah Melter, MD

## 2021-06-04 ENCOUNTER — Ambulatory Visit: Payer: Medicare HMO | Admitting: Neurology

## 2021-06-24 ENCOUNTER — Encounter: Payer: Self-pay | Admitting: Physical Therapy

## 2021-06-24 ENCOUNTER — Ambulatory Visit: Payer: Medicare HMO | Attending: Neurosurgery | Admitting: Physical Therapy

## 2021-06-24 ENCOUNTER — Other Ambulatory Visit: Payer: Self-pay

## 2021-06-24 DIAGNOSIS — M545 Low back pain, unspecified: Secondary | ICD-10-CM | POA: Diagnosis present

## 2021-06-24 DIAGNOSIS — R29898 Other symptoms and signs involving the musculoskeletal system: Secondary | ICD-10-CM | POA: Diagnosis not present

## 2021-06-24 DIAGNOSIS — G8929 Other chronic pain: Secondary | ICD-10-CM

## 2021-06-24 DIAGNOSIS — R2681 Unsteadiness on feet: Secondary | ICD-10-CM | POA: Diagnosis present

## 2021-06-24 DIAGNOSIS — Z9011 Acquired absence of right breast and nipple: Secondary | ICD-10-CM | POA: Diagnosis present

## 2021-06-24 DIAGNOSIS — M25611 Stiffness of right shoulder, not elsewhere classified: Secondary | ICD-10-CM | POA: Insufficient documentation

## 2021-06-24 DIAGNOSIS — M542 Cervicalgia: Secondary | ICD-10-CM | POA: Diagnosis present

## 2021-06-24 NOTE — Therapy (Signed)
[facility] [address]  [place], [place], [address] Phone: [phone]   Fax:  [phone]  Physical Therapy Evaluation  Patient Details  Name: [name] MRN: [id number] Date of Birth: 1946-08-30 Referring Provider (PT): Kary Kos, MD   Encounter Date: 06/24/2021   PT End of Session - 06/24/21 1802     Visit Number 1    Number of Visits 13    Date for PT Re-Evaluation 08/05/21    Authorization Type Aetna Medicare    PT Start Time 1544   pt late   PT Stop Time 1615    PT Time Calculation (min) 31 min    Activity Tolerance Patient tolerated treatment well    Behavior During Therapy [facility] for tasks assessed/performed             Past Medical History:  Diagnosis Date   Allergic rhinitis    Breast cancer ([place]) 10/17/2020   CAD (coronary artery disease)    , s/p CAGB 01/2011- LIMA to LAD patent, all other vein grafts occluded. Circumflex DES 3/13 placed following lateral ischemia on nuclear stress test   Cervical spondylosis    , bulging  discs   Disc herniation    L4-L5, L5-S1   Family history of breast cancer 11/18/2020   Hypertension    Mixed hyperlipidemia    Obesity    Post concussion syndrome    processes information slowly,    Postmenopausal    Prediabetes     Past Surgical History:  Procedure Laterality Date   ABDOMINAL HYSTERECTOMY     ANTERIOR CERVICAL DECOMP/DISCECTOMY FUSION N/A 04/23/2021   Procedure: Anterior Cervical Discectomy Fusion - Cervical Four- Cervical Five - Cervical Five -Cervical Six;  Surgeon: Kary Kos, MD;  Location: [place];  Service: Neurosurgery;  Laterality: N/A;  Anterior Cervical Discectomy Fusion - Cervical Four- Cervical Five - Cervical Five -Cervical Six   Bone Spur Left    Foot   BREAST BIOPSY     BREAST ENHANCEMENT SURGERY     BREAST IMPLANT EXCHANGE     replaced rupture implant   BREAST LUMPECTOMY WITH RADIOACTIVE SEED LOCALIZATION Right 12/11/2020    Procedure: RIGHT BREAST LUMPECTOMY WITH RADIOACTIVE SEED LOCALIZATION;  Surgeon: Jovita Kussmaul, MD;  Location: [place];  Service: General;  Laterality: Right;   CARDIAC CATHETERIZATION     COLONOSCOPY     CORONARY ARTERY BYPASS GRAFT     JOINT REPLACEMENT     right total knee replacement   Left Bunionectomy     LEFT HEART CATHETERIZATION WITH CORONARY/GRAFT ANGIOGRAM  02/05/2012   Procedure: LEFT HEART CATHETERIZATION WITH Beatrix Fetters;  Surgeon: Candee Furbish, MD;  Location: [facility] CATH LAB;  Service: Cardiovascular;;   MASTECTOMY W/ SENTINEL NODE BIOPSY Right 02/19/2021   Procedure: RIGHT MASTECTOMY;  Surgeon: Jovita Kussmaul, MD;  Location: [place];  Service: General;  Laterality: Right;  RNFA, PEC BLOCK   PARTIAL HYSTERECTOMY     RE-EXCISION OF BREAST CANCER,SUPERIOR MARGINS Right 01/15/2021   Procedure: RE-EXCISION RIGHT BREAST MEDIAL MARGIN;  Surgeon: Jovita Kussmaul, MD;  Location: [place];  Service: General;  Laterality: Right;   REPLACEMENT TOTAL KNEE Right    SENTINEL NODE BIOPSY Right 02/19/2021   Procedure: SENTINEL LYMPH NODE BIOPSY;  Surgeon: Jovita Kussmaul, MD;  Location: [place];  Service: General;  Laterality: Right;   TONSILLECTOMY     TUBAL LIGATION Bilateral     There  were no vitals filed for this visit.    Subjective Assessment - 06/24/21 1547     Subjective Patient reports that she had HHPT after her ACDF on 04/23/21. Also reports that she has trouble with her lower spine which she believes is causing her B LEs to swell since June 2022. Also reports having had a R mastectomy in March 2022. Was given a RW after her surgery because she is at a risk of falls. Has had 4 falls in the past 6 months. Reports that her husband has to assist her in getting herself off the floor. Has not had any neck pain since surgery. Denies N/T or radiation, but her neck feels stiff. Wears a neck brace a night and her husband wants her to wear it in the car in  case they get in a car accident.  Wearing a R wrist brace d/t a fall a few weeks ago- unsure if she bruised it or chipped a bone as she has not gotten it checked out. Reports chronic LBP for several months which occurs when she sits without back support. Denies N/T or radiation in LEs.    Pertinent History CAD s/p CABG in 2012, breast CA with R mastectomy 2022, HTN, HLD, L bunionectomy, post-concussion syndrome, R TKA    Limitations Lifting;Standing;Walking;House hold activities    Diagnostic tests none recent    Patient Stated Goals improve balance    Currently in Pain? Yes    Pain Score 0-No pain    Pain Location Back    Pain Orientation Right;Left;Lower    Pain Descriptors / Indicators Aching    Pain Type Chronic pain                [facility] PT Assessment - 06/24/21 1559       Assessment   Medical Diagnosis Disease of spinal cord    Referring Provider (PT) Kary Kos, MD    Onset Date/Surgical Date 04/23/21    Hand Dominance Right    Next MD Visit next week    Prior Therapy HHPT      Precautions   Precautions Fall      Balance Screen   Has the patient fallen in the past 6 months Yes    How many times? 4    Has the patient had a decrease in activity level because of a fear of falling?  No    Is the patient reluctant to leave their home because of a fear of falling?  No      Home Ecologist residence    Living Arrangements Spouse/significant other    Available Help at Discharge Family    Type of [place] to enter    Entrance Stairs-Number of Steps 1    Falls Two level;Able to live on main level with bedroom/bathroom    [place] - 2 wheels;[place] - single point      Prior Function   Level of Independence Independent    Vocation Part time employment    Press photographer work, sitting    Leisure vacation bible school      Cognition   Overall  Cognitive Status No family/caregiver present to determine baseline cognitive functioning      Sensation   Light Touch Appears Intact      Coordination   Gross Motor Movements are Fluid and Coordinated --   slow  Posture/Postural Control   Posture/Postural Control Postural limitations    Postural Limitations --   guarded and stiff through cervical musculature     ROM / Strength   AROM / PROM / Strength AROM      AROM   AROM Assessment Site Cervical    Cervical Flexion 31    Cervical Extension 27    Cervical - Right Side Bend 14    Cervical - Left Side Bend 18      Palpation   Palpation comment TTP over R anterior pec and proximal biceps; soft tissue restriction in B cervical and shoulder musculature      Ambulation/Gait   Gait Pattern Step-to pattern;Step-through pattern;Decreased step length - right;Decreased step length - left    Ambulation Surface Level;Indoor    Gait velocity decreased                        Objective measurements completed on examination: See above findings.               PT Education - 06/24/21 1801     Education Details prognosis, POC, HEP; advised patient to discontinue use of neck brace at this time unless specifically instructed by MD    Person(s) Educated Patient    Methods Explanation;Demonstration;Tactile cues;Verbal cues;Handout    Comprehension Verbalized understanding;Returned demonstration              PT Short Term Goals - 06/24/21 1810       PT SHORT TERM GOAL #1   Title Patient to be independent with initial HEP.    Time 3    Period Weeks    Status New    Target Date 07/15/21               PT Long Term Goals - 06/24/21 1810       PT LONG TERM GOAL #1   Title Patient to be independent with advanced HEP.    Time 6    Period Weeks    Status New    Target Date 08/05/21      PT LONG TERM GOAL #2   Title Patient to demonstrate B UE and LE strength >/=4+/5.    Time 6    Period Weeks     Status New    Target Date 08/05/21      PT LONG TERM GOAL #3   Title Patient to demonstrate cervical AROM WFL and without pain limiting.    Time 6    Period Weeks    Status New    Target Date 08/05/21      PT LONG TERM GOAL #4   Title Patient to report 70% improvement in LBP.    Time 6    Period Weeks    Status New    Target Date 08/05/21                    Plan - 06/24/21 1803     Clinical Impression Statement Patient is a 75 y/o F presenting to OPPT with neck stiffness s/p ACDF on 04/23/21 as well as chronic B LBP and imbalance. Patient reports 4 falls in the past 6 months and is now ambulating with RW. Denies neck pain, N/T, or radiation since surgery but notes cervical stiffness. LBP worse when sitting without back support. Patient today presenting with guarded cervical musculature, limited cervical AROM, TTP over R anterior pec and proximal biceps, soft tissue restriction in B cervical  and shoulder musculature, and gait deviations. Assessment today was limited by time, plan for further assessment next session. Patient was educated on gentle AROM HEP- patient reported understanding. Would benefit from skilled PT services 2x/week for 6weeks to address aforementioned impairments.    Personal Factors and Comorbidities Age;Comorbidity 3+;Fitness;Past/Current Experience;Time since onset of injury/illness/exacerbation    Comorbidities CAD s/p CABG in 2012, breast CA with R mastectomy 2022, HTN, HLD, L bunionectomy, post-concussion syndrome, R TKA    Examination-Activity Limitations Sit;Bend;Squat;Carry;Stand;Dressing;Transfers;Hygiene/Grooming;Lift;Locomotion Level;Reach Overhead    Examination-Participation Restrictions Meal Prep;Laundry;Occupation;Yard Work;Driving;Volunteer;Shop;Community Activity;Cleaning;Church    Stability/Clinical Decision Making Stable/Uncomplicated    Clinical Decision Making Low    Rehab Potential Good    PT Frequency 2x / week    PT Duration 6  weeks    PT Treatment/Interventions ADLs/Self Care Home Management;Cryotherapy;Electrical Stimulation;Iontophoresis 4mg /ml Dexamethasone;Moist Heat;Balance training;Therapeutic exercise;Therapeutic activities;Functional mobility training;Stair training;Gait training;Ultrasound;Neuromuscular re-education;Patient/family education;Manual techniques;Taping;Energy conservation;Dry needling;Passive range of motion    PT Next Visit Plan reassess HEP; cervical FOTO; assess UE strength, lumbar AROM, LE strength, Berg    Consulted and Agree with Plan of Care Patient             Patient will benefit from skilled therapeutic intervention in order to improve the following deficits and impairments:  Abnormal gait, Hypomobility, Decreased activity tolerance, Decreased strength, Increased fascial restricitons, Pain, Impaired UE functional use, Decreased balance, Decreased mobility, Difficulty walking, Increased muscle spasms, Improper body mechanics, Decreased range of motion, Postural dysfunction, Impaired flexibility  Visit Diagnosis: Other symptoms and signs involving the musculoskeletal system  Chronic bilateral low back pain without sciatica  Unsteadiness on feet  Cervicalgia     Problem List Patient Active Problem List   Diagnosis Date Noted   Paroxysmal atrial fibrillation ([place]) 05/15/2021   Secondary hypercoagulable state ([place]) 05/15/2021   Myelopathy ([place]) 04/23/2021   Genetic testing 12/04/2020   Family history of breast cancer 11/18/2020   Erythrocytosis 11/14/2020   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  [place], [place], [address] Phone: [phone]   Fax:  [phone]  Name: [name] MRN: [id number] Date of Birth: December 18, 1945

## 2021-06-25 ENCOUNTER — Encounter: Payer: Self-pay | Admitting: Physical Therapy

## 2021-06-25 ENCOUNTER — Ambulatory Visit: Payer: Medicare HMO | Admitting: Physical Therapy

## 2021-06-25 DIAGNOSIS — R2681 Unsteadiness on feet: Secondary | ICD-10-CM | POA: Diagnosis not present

## 2021-06-25 DIAGNOSIS — R29898 Other symptoms and signs involving the musculoskeletal system: Secondary | ICD-10-CM | POA: Diagnosis not present

## 2021-06-25 DIAGNOSIS — G8929 Other chronic pain: Secondary | ICD-10-CM | POA: Diagnosis not present

## 2021-06-25 DIAGNOSIS — M542 Cervicalgia: Secondary | ICD-10-CM | POA: Diagnosis not present

## 2021-06-25 DIAGNOSIS — Z1389 Encounter for screening for other disorder: Secondary | ICD-10-CM | POA: Diagnosis not present

## 2021-06-25 DIAGNOSIS — Z9011 Acquired absence of right breast and nipple: Secondary | ICD-10-CM | POA: Diagnosis not present

## 2021-06-25 DIAGNOSIS — M545 Low back pain, unspecified: Secondary | ICD-10-CM | POA: Diagnosis not present

## 2021-06-25 DIAGNOSIS — Z0000 Encounter for general adult medical examination without abnormal findings: Secondary | ICD-10-CM | POA: Diagnosis not present

## 2021-06-25 DIAGNOSIS — M25611 Stiffness of right shoulder, not elsewhere classified: Secondary | ICD-10-CM | POA: Diagnosis not present

## 2021-06-25 NOTE — Therapy (Signed)
[facility] [address]  [place], [place], [address] Phone: [phone]   Fax:  [phone]  Physical Therapy Treatment  Patient Details  Name: [name] MRN: [id number] Date of Birth: 09-Jan-1946 Referring Provider (PT): Kary Kos, MD   Encounter Date: 06/25/2021   PT End of Session - 06/25/21 1447     Visit Number 2    Number of Visits 13    Date for PT Re-Evaluation 08/05/21    Authorization Type Aetna Medicare    PT Start Time 9833    PT Stop Time 1444    PT Time Calculation (min) 51 min    Activity Tolerance Patient tolerated treatment well    Behavior During Therapy [facility] for tasks assessed/performed             Past Medical History:  Diagnosis Date   Allergic rhinitis    Breast cancer ([place]) 10/17/2020   CAD (coronary artery disease)    , s/p CAGB 01/2011- LIMA to LAD patent, all other vein grafts occluded. Circumflex DES 3/13 placed following lateral ischemia on nuclear stress test   Cervical spondylosis    , bulging  discs   Disc herniation    L4-L5, L5-S1   Family history of breast cancer 11/18/2020   Hypertension    Mixed hyperlipidemia    Obesity    Post concussion syndrome    processes information slowly,    Postmenopausal    Prediabetes     Past Surgical History:  Procedure Laterality Date   ABDOMINAL HYSTERECTOMY     ANTERIOR CERVICAL DECOMP/DISCECTOMY FUSION N/A 04/23/2021   Procedure: Anterior Cervical Discectomy Fusion - Cervical Four- Cervical Five - Cervical Five -Cervical Six;  Surgeon: Kary Kos, MD;  Location: [facility];  Service: Neurosurgery;  Laterality: N/A;  Anterior Cervical Discectomy Fusion - Cervical Four- Cervical Five - Cervical Five -Cervical Six   Bone Spur Left    Foot   BREAST BIOPSY     BREAST ENHANCEMENT SURGERY     BREAST IMPLANT EXCHANGE     replaced rupture implant   BREAST LUMPECTOMY WITH RADIOACTIVE SEED LOCALIZATION Right 12/11/2020    Procedure: RIGHT BREAST LUMPECTOMY WITH RADIOACTIVE SEED LOCALIZATION;  Surgeon: Jovita Kussmaul, MD;  Location: [place];  Service: General;  Laterality: Right;   CARDIAC CATHETERIZATION     COLONOSCOPY     CORONARY ARTERY BYPASS GRAFT     JOINT REPLACEMENT     right total knee replacement   Left Bunionectomy     LEFT HEART CATHETERIZATION WITH CORONARY/GRAFT ANGIOGRAM  02/05/2012   Procedure: LEFT HEART CATHETERIZATION WITH Beatrix Fetters;  Surgeon: Candee Furbish, MD;  Location: [facility] CATH LAB;  Service: Cardiovascular;;   MASTECTOMY W/ SENTINEL NODE BIOPSY Right 02/19/2021   Procedure: RIGHT MASTECTOMY;  Surgeon: Jovita Kussmaul, MD;  Location: [facility];  Service: General;  Laterality: Right;  RNFA, PEC BLOCK   PARTIAL HYSTERECTOMY     RE-EXCISION OF BREAST CANCER,SUPERIOR MARGINS Right 01/15/2021   Procedure: RE-EXCISION RIGHT BREAST MEDIAL MARGIN;  Surgeon: Jovita Kussmaul, MD;  Location: [facility];  Service: General;  Laterality: Right;   REPLACEMENT TOTAL KNEE Right    SENTINEL NODE BIOPSY Right 02/19/2021   Procedure: SENTINEL LYMPH NODE BIOPSY;  Surgeon: Jovita Kussmaul, MD;  Location: [facility];  Service: General;  Laterality: Right;   TONSILLECTOMY     TUBAL LIGATION Bilateral     There were no vitals  filed for this visit.   Subjective Assessment - 06/25/21 1352     Subjective Did her exercises last night and again this AM. Was up all night going to the bathroom because she has fish tacos for dinner.    Pertinent History CAD s/p CABG in 2012, breast CA with R mastectomy 2022, HTN, HLD, L bunionectomy, post-concussion syndrome, R TKA    Diagnostic tests none recent    Patient Stated Goals improve balance    Currently in Pain? No/denies                [facility] PT Assessment - 06/25/21 0001       Observation/Other Assessments   Focus on Therapeutic Outcomes (FOTO)  Cervical: 66      ROM / Strength   AROM / PROM / Strength Strength;AROM       AROM   AROM Assessment Site Lumbar    Cervical - Right Rotation 41   "tight"   Cervical - Left Rotation 44   "tight"   Lumbar Flexion proximal shin   compensating with hip hinge   Lumbar Extension severe-moderately limited    Lumbar - Right Side Bend jt line    Lumbar - Left Side Bend jt line    Lumbar - Right Rotation severely limited    Lumbar - Left Rotation severely limited      Strength   Strength Assessment Site Shoulder;Elbow;Wrist;Hand;Hip;Knee;Ankle    Right/Left Shoulder Right;Left    Right Shoulder Flexion 4+/5    Right Shoulder ABduction 4+/5    Right Shoulder Internal Rotation 4+/5    Right Shoulder External Rotation 4+/5    Left Shoulder Flexion 4+/5    Left Shoulder ABduction 4+/5    Left Shoulder Internal Rotation 4+/5    Left Shoulder External Rotation 4+/5    Right/Left Elbow Right;Left    Right Elbow Flexion 4+/5    Right Elbow Extension 4+/5    Left Elbow Flexion 4+/5    Left Elbow Extension 4+/5    Right/Left Wrist Left;Right    Right Wrist Flexion 4+/5    Right Wrist Extension 4+/5    Left Wrist Flexion 4+/5    Left Wrist Extension 4+/5    Right/Left hand Right;Left    Right Hand Grip (lbs) 25   26, 24, 25   Left Hand Grip (lbs) 21   20, 23, 20   Right/Left Hip Right;Left    Right Hip Flexion 4+/5    Right Hip ABduction 4+/5    Right Hip ADduction 4/5    Left Hip Flexion 4/5    Left Hip ABduction 4+/5    Left Hip ADduction 4/5    Right/Left Knee Right;Left    Right Knee Flexion 4/5    Right Knee Extension 4/5    Left Knee Flexion 4/5    Left Knee Extension 4+/5    Right/Left Ankle Right;Left    Right Ankle Dorsiflexion 4+/5    Right Ankle Plantar Flexion 4+/5    Left Ankle Dorsiflexion 4+/5    Left Ankle Plantar Flexion 4+/5                           [facility] Adult PT Treatment/Exercise - 06/25/21 0001       Exercises   Exercises Neck;Lumbar      Neck Exercises: Seated   Other Seated Exercise cervical flex/ex, R/L side  bend, R/L rotation 5x each withi comfortable range  Lumbar Exercises: Seated   Other Seated Lumbar Exercises R/L HS curl 10x each with green TB   cues to increase amplitude   Other Seated Lumbar Exercises ball squeeze 5x10"                    PT Education - 06/25/21 1445     Education Details update to HEP; discussion on today's objective measurements    Person(s) Educated Patient    Methods Explanation;Demonstration;Tactile cues;Verbal cues;Handout    Comprehension Verbalized understanding;Returned demonstration              PT Short Term Goals - 06/25/21 1449       PT SHORT TERM GOAL #1   Title Patient to be independent with initial HEP.    Time 3    Period Weeks    Status Achieved    Target Date 07/15/21               PT Long Term Goals - 06/25/21 1449       PT LONG TERM GOAL #1   Title Patient to be independent with advanced HEP.    Time 6    Period Weeks    Status On-going      PT LONG TERM GOAL #2   Title Patient to demonstrate B UE and LE strength >/=4+/5.    Time 6    Period Weeks    Status On-going      PT LONG TERM GOAL #3   Title Patient to demonstrate cervical AROM WFL and without pain limiting.    Time 6    Period Weeks    Status On-going      PT LONG TERM GOAL #4   Title Patient to report 70% improvement in LBP.    Time 6    Period Weeks    Status On-going                   Plan - 06/25/21 1448     Clinical Impression Statement Patient arrived to session with report of HEP compliance. Patient required cues for safety and not reaching for walker when standing from seat. Assessed UE and LE strength which revealed good UE strength and B hip and knee weakness. Lumbar AROM is considerably limited but nonpainful. Discussed patient's footwear as she reports her LE swelling making it hard to fit into her regular shoes. Today she wears slippers, thus suggested going up a size in tennis shoes. Reviewed HEP which was  tolerated and performed fairly well. Patient also reported understanding of HEP update for LE and lumbar deficits. No complaints at end of session. Patient will continue to benefit from skilled PT services.    Personal Factors and Comorbidities Age;Comorbidity 3+;Fitness;Past/Current Experience;Time since onset of injury/illness/exacerbation    Comorbidities CAD s/p CABG in 2012, breast CA with R mastectomy 2022, HTN, HLD, L bunionectomy, post-concussion syndrome, R TKA    Examination-Activity Limitations Sit;Bend;Squat;Carry;Stand;Dressing;Transfers;Hygiene/Grooming;Lift;Locomotion Level;Reach Overhead    Examination-Participation Restrictions Meal Prep;Laundry;Occupation;Yard Work;Driving;Volunteer;Shop;Community Activity;Cleaning;Church    Stability/Clinical Decision Making Stable/Uncomplicated    Rehab Potential Good    PT Frequency 2x / week    PT Duration 6 weeks    PT Treatment/Interventions ADLs/Self Care Home Management;Cryotherapy;Electrical Stimulation;Iontophoresis 4mg /ml Dexamethasone;Moist Heat;Balance training;Therapeutic exercise;Therapeutic activities;Functional mobility training;Stair training;Gait training;Ultrasound;Neuromuscular re-education;Patient/family education;Manual techniques;Taping;Energy conservation;Dry needling;Passive range of motion    PT Next Visit Plan reassess HEP, Berg; progress cervical and lumbar ROM, LE strengthening    Consulted and Agree with Plan of Care Patient  Patient will benefit from skilled therapeutic intervention in order to improve the following deficits and impairments:  Abnormal gait, Hypomobility, Decreased activity tolerance, Decreased strength, Increased fascial restricitons, Pain, Impaired UE functional use, Decreased balance, Decreased mobility, Difficulty walking, Increased muscle spasms, Improper body mechanics, Decreased range of motion, Postural dysfunction, Impaired flexibility  Visit Diagnosis: Other symptoms and  signs involving the musculoskeletal system  Chronic bilateral low back pain without sciatica  Unsteadiness on feet  Cervicalgia     Problem List Patient Active Problem List   Diagnosis Date Noted   Paroxysmal atrial fibrillation ([place]) 05/15/2021   Secondary hypercoagulable state ([place]) 05/15/2021   Myelopathy ([place]) 04/23/2021   Genetic testing 12/04/2020   Family history of breast cancer 11/18/2020   Erythrocytosis 11/14/2020   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  [place], [place], [address] Phone: [phone]   Fax:  [phone]  Name: [name] MRN: [id number] Date of Birth: February 14, 1946

## 2021-07-01 ENCOUNTER — Encounter: Payer: Self-pay | Admitting: Physical Therapy

## 2021-07-01 ENCOUNTER — Other Ambulatory Visit: Payer: Self-pay

## 2021-07-01 ENCOUNTER — Ambulatory Visit: Payer: Medicare HMO | Attending: Internal Medicine

## 2021-07-01 ENCOUNTER — Ambulatory Visit: Payer: Medicare HMO | Admitting: Physical Therapy

## 2021-07-01 DIAGNOSIS — R29898 Other symptoms and signs involving the musculoskeletal system: Secondary | ICD-10-CM

## 2021-07-01 DIAGNOSIS — Z23 Encounter for immunization: Secondary | ICD-10-CM

## 2021-07-01 DIAGNOSIS — M25611 Stiffness of right shoulder, not elsewhere classified: Secondary | ICD-10-CM

## 2021-07-01 DIAGNOSIS — M542 Cervicalgia: Secondary | ICD-10-CM

## 2021-07-01 DIAGNOSIS — R2681 Unsteadiness on feet: Secondary | ICD-10-CM | POA: Diagnosis not present

## 2021-07-01 DIAGNOSIS — M545 Low back pain, unspecified: Secondary | ICD-10-CM | POA: Diagnosis not present

## 2021-07-01 DIAGNOSIS — Z9011 Acquired absence of right breast and nipple: Secondary | ICD-10-CM | POA: Diagnosis not present

## 2021-07-01 DIAGNOSIS — G8929 Other chronic pain: Secondary | ICD-10-CM | POA: Diagnosis not present

## 2021-07-01 NOTE — Progress Notes (Signed)
   Covid-19 Vaccination Clinic  Name:  [name]    MRN: [id number] DOB: 12-01-1946  07/01/2021  [name] was observed post Covid-19 immunization for 15 minutes without incident. She was provided with Vaccine Information Sheet and instruction to access the V-Safe system.   [name] was instructed to call 911 with any severe reactions post vaccine: Difficulty breathing  Swelling of face and throat  A fast heartbeat  A bad rash all over body  Dizziness and weakness   Immunizations Administered     Name Date Dose VIS Date Route   PFIZER Comrnaty(Gray TOP) Covid-19 Vaccine 07/01/2021  1:43 PM 0.3 mL 11/14/2020 Intramuscular   Manufacturer: [place]   Lot: [id number]   [place]: [phone]

## 2021-07-01 NOTE — Therapy (Signed)
[facility] [address]  [place], [place], [address] Phone: [phone]   Fax:  [phone]  Physical Therapy Treatment  Patient Details  Name: [name] MRN: [id number] Date of Birth: 29-Aug-1946 Referring Provider (PT): Kary Kos, MD   Encounter Date: 07/01/2021   PT End of Session - 07/01/21 1506     Visit Number 3    Number of Visits 13    Date for PT Re-Evaluation 08/05/21    Authorization Type [employer] Medicare    Progress Note Due on Visit 10    PT Start Time 1408    PT Stop Time 1500    PT Time Calculation (min) 52 min    Activity Tolerance Patient tolerated treatment well    Behavior During Therapy [facility] [place] for tasks assessed/performed             Past Medical History:  Diagnosis Date   Allergic rhinitis    Breast cancer ([place]) 10/17/2020   CAD (coronary artery disease)    , s/p CAGB 01/2011- LIMA to LAD patent, all other vein grafts occluded. Circumflex DES 3/13 placed following lateral ischemia on nuclear stress test   Cervical spondylosis    , bulging  discs   Disc herniation    L4-L5, L5-S1   Family history of breast cancer 11/18/2020   Hypertension    Mixed hyperlipidemia    Obesity    Post concussion syndrome    processes information slowly,    Postmenopausal    Prediabetes     Past Surgical History:  Procedure Laterality Date   ABDOMINAL HYSTERECTOMY     ANTERIOR CERVICAL DECOMP/DISCECTOMY FUSION N/A 04/23/2021   Procedure: Anterior Cervical Discectomy Fusion - Cervical Four- Cervical Five - Cervical Five -Cervical Six;  Surgeon: Kary Kos, MD;  Location: [place];  Service: Neurosurgery;  Laterality: N/A;  Anterior Cervical Discectomy Fusion - Cervical Four- Cervical Five - Cervical Five -Cervical Six   Bone Spur Left    Foot   BREAST BIOPSY     BREAST ENHANCEMENT SURGERY     BREAST IMPLANT EXCHANGE     replaced rupture implant   BREAST LUMPECTOMY WITH RADIOACTIVE SEED  LOCALIZATION Right 12/11/2020   Procedure: RIGHT BREAST LUMPECTOMY WITH RADIOACTIVE SEED LOCALIZATION;  Surgeon: Jovita Kussmaul, MD;  Location: [place];  Service: General;  Laterality: Right;   CARDIAC CATHETERIZATION     COLONOSCOPY     CORONARY ARTERY BYPASS GRAFT     JOINT REPLACEMENT     right total knee replacement   Left Bunionectomy     LEFT HEART CATHETERIZATION WITH CORONARY/GRAFT ANGIOGRAM  02/05/2012   Procedure: LEFT HEART CATHETERIZATION WITH Beatrix Fetters;  Surgeon: Candee Furbish, MD;  Location: [facility] CATH LAB;  Service: Cardiovascular;;   MASTECTOMY W/ SENTINEL NODE BIOPSY Right 02/19/2021   Procedure: RIGHT MASTECTOMY;  Surgeon: Jovita Kussmaul, MD;  Location: [place];  Service: General;  Laterality: Right;  RNFA, PEC BLOCK   PARTIAL HYSTERECTOMY     RE-EXCISION OF BREAST CANCER,SUPERIOR MARGINS Right 01/15/2021   Procedure: RE-EXCISION RIGHT BREAST MEDIAL MARGIN;  Surgeon: Jovita Kussmaul, MD;  Location: [place];  Service: General;  Laterality: Right;   REPLACEMENT TOTAL KNEE Right    SENTINEL NODE BIOPSY Right 02/19/2021   Procedure: SENTINEL LYMPH NODE BIOPSY;  Surgeon: Jovita Kussmaul, MD;  Location: [place];  Service: General;  Laterality: Right;   TONSILLECTOMY     TUBAL LIGATION  Bilateral     There were no vitals filed for this visit.   Subjective Assessment - 07/01/21 1412     Subjective Reports she just got her COVID booster shot downstairs.  Reports she has been doing her exercises.    Pertinent History CAD s/p CABG in 2012, breast CA with R mastectomy 2022, HTN, HLD, L bunionectomy, post-concussion syndrome, R TKA    Limitations Lifting;Standing;Walking;House hold activities    Diagnostic tests none recent    Patient Stated Goals improve balance    Currently in Pain? No/denies                               [facility] Adult PT Treatment/Exercise - 07/01/21 0001       Balance   Balance Assessed Yes       Standardized Balance Assessment   Standardized Balance Assessment Berg Balance Test      Berg Balance Test   Sit to Stand Able to stand  independently using hands    Standing Unsupported Able to stand safely 2 minutes    Sitting with Back Unsupported but Feet Supported on Floor or Stool Able to sit safely and securely 2 minutes    Stand to Sit Controls descent by using hands    Transfers Able to transfer safely, definite need of hands    Standing Unsupported with Eyes Closed Able to stand 10 seconds safely    Standing Ubsupported with Feet Together Able to place feet together independently and stand 1 minute safely    From Standing, Reach Forward with Outstretched Arm Can reach forward >12 cm safely (5")    From Standing Position, Pick up Object from Floor Able to pick up shoe, needs supervision    From Standing Position, Turn to Look Behind Over each Shoulder Turn sideways only but maintains balance    Turn 360 Degrees Needs close supervision or verbal cueing    Standing Unsupported, Alternately Place Feet on Step/Stool Able to complete >2 steps/needs minimal assist    Standing Unsupported, One Foot in [employer] balance while stepping or standing    Standing on One Leg Tries to lift leg/unable to hold 3 seconds but remains standing independently    Total Score 36      Exercises   Exercises Knee/Hip      Neck Exercises: Seated   Other Seated Exercise reviewed HEP for cervical flex/ex, R/L side bend, R/L rotation 5x each within comfortable range      Lumbar Exercises: Aerobic   Nustep L5 x 6 min      Knee/Hip Exercises: Standing   Heel Raises Both;20 reps    Heel Raises Limitations at counter with bil UE support for safety    Hip Flexion Stengthening;Both;20 reps    Hip Flexion Limitations at counter with bil UE support for safety    Hip Extension Stengthening;Both;20 reps    Extension Limitations at counter with bil UE support for safety                    PT  Education - 07/01/21 1506     Education Details updated HEP, reviewed previous HEP, BERG findings and safety    Person(s) Educated Patient    Methods Explanation;Handout    Comprehension Verbalized understanding              PT Short Term Goals - 06/25/21 1449       PT SHORT  TERM GOAL #1   Title Patient to be independent with initial HEP.    Time 3    Period Weeks    Status Achieved    Target Date 07/15/21               PT Long Term Goals - 06/25/21 1449       PT LONG TERM GOAL #1   Title Patient to be independent with advanced HEP.    Time 6    Period Weeks    Status On-going      PT LONG TERM GOAL #2   Title Patient to demonstrate B UE and LE strength >/=4+/5.    Time 6    Period Weeks    Status On-going      PT LONG TERM GOAL #3   Title Patient to demonstrate cervical AROM WFL and without pain limiting.    Time 6    Period Weeks    Status On-going      PT LONG TERM GOAL #4   Title Patient to report 70% improvement in LBP.    Time 6    Period Weeks    Status On-going                   Plan - 07/01/21 1507     Clinical Impression Statement Patient reports good compliance with HEP, has stopped using cervical collar, and reports no pain.  She participated in [place] today, scoring 36/56 which places her at high risk of falls, walker is appropriate, reinforced need for walker at all times for safety and that our goal would be improving that score until safe to use LRAD.  Her HEP was progressed with standing exercises at counter for safety working on posterior chain, new handout provided.  Noted improved cervical AROM today, able to rotate to 50 deg bil, extend 25 deg and flex neck 30 deg, although tightness reported at end range all positions.  She will benefit from continued skilled PT visits.    Personal Factors and Comorbidities Age;Comorbidity 3+;Fitness;Past/Current Experience;Time since onset of injury/illness/exacerbation    Comorbidities CAD  s/p CABG in 2012, breast CA with R mastectomy 2022, HTN, HLD, L bunionectomy, post-concussion syndrome, R TKA    Examination-Activity Limitations Sit;Bend;Squat;Carry;Stand;Dressing;Transfers;Hygiene/Grooming;Lift;Locomotion Level;Reach Overhead    Examination-Participation Restrictions Meal Prep;Laundry;Occupation;Yard Work;Driving;Volunteer;Shop;Community Activity;Cleaning;Church    Stability/Clinical Decision Making Stable/Uncomplicated    Rehab Potential Good    PT Frequency 2x / week    PT Duration 6 weeks    PT Treatment/Interventions ADLs/Self Care Home Management;Cryotherapy;Electrical Stimulation;Iontophoresis '4mg'$ /ml Dexamethasone;Moist Heat;Balance training;Therapeutic exercise;Therapeutic activities;Functional mobility training;Stair training;Gait training;Ultrasound;Neuromuscular re-education;Patient/family education;Manual techniques;Taping;Energy conservation;Dry needling;Passive range of motion    PT Next Visit Plan continue to progress LE strengthening, balance, cervical ROM    PT Home Exercise Plan Access Code [email or web address]    Consulted and Agree with Plan of Care Patient             Patient will benefit from skilled therapeutic intervention in order to improve the following deficits and impairments:  Abnormal gait, Hypomobility, Decreased activity tolerance, Decreased strength, Increased fascial restricitons, Pain, Impaired UE functional use, Decreased balance, Decreased mobility, Difficulty walking, Increased muscle spasms, Improper body mechanics, Decreased range of motion, Postural dysfunction, Impaired flexibility  Visit Diagnosis: Other symptoms and signs involving the musculoskeletal system  Chronic bilateral low back pain without sciatica  Unsteadiness on feet  Cervicalgia  Status post mastectomy, right  Stiffness of right shoulder, not elsewhere classified     Problem List  Patient Active Problem List   Diagnosis Date Noted   Paroxysmal atrial fibrillation  ([place]) 05/15/2021   Secondary hypercoagulable state ([place]) 05/15/2021   Myelopathy ([place]) 04/23/2021   Genetic testing 12/04/2020   Family history of breast cancer 11/18/2020   Erythrocytosis 11/14/2020   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

## 2021-07-03 ENCOUNTER — Other Ambulatory Visit: Payer: Self-pay

## 2021-07-03 ENCOUNTER — Encounter: Payer: Self-pay | Admitting: Physical Therapy

## 2021-07-03 ENCOUNTER — Ambulatory Visit: Payer: Medicare HMO | Admitting: Physical Therapy

## 2021-07-03 DIAGNOSIS — M25611 Stiffness of right shoulder, not elsewhere classified: Secondary | ICD-10-CM | POA: Diagnosis not present

## 2021-07-03 DIAGNOSIS — Z9011 Acquired absence of right breast and nipple: Secondary | ICD-10-CM | POA: Diagnosis not present

## 2021-07-03 DIAGNOSIS — M545 Low back pain, unspecified: Secondary | ICD-10-CM | POA: Diagnosis not present

## 2021-07-03 DIAGNOSIS — M542 Cervicalgia: Secondary | ICD-10-CM

## 2021-07-03 DIAGNOSIS — G8929 Other chronic pain: Secondary | ICD-10-CM

## 2021-07-03 DIAGNOSIS — R29898 Other symptoms and signs involving the musculoskeletal system: Secondary | ICD-10-CM | POA: Diagnosis not present

## 2021-07-03 DIAGNOSIS — Z6825 Body mass index (BMI) 25.0-25.9, adult: Secondary | ICD-10-CM | POA: Diagnosis not present

## 2021-07-03 DIAGNOSIS — R2681 Unsteadiness on feet: Secondary | ICD-10-CM | POA: Diagnosis not present

## 2021-07-03 DIAGNOSIS — G959 Disease of spinal cord, unspecified: Secondary | ICD-10-CM | POA: Diagnosis not present

## 2021-07-03 NOTE — Patient Instructions (Signed)
Access Code: [email or web address] URL: [email or web address] Date: 07/03/2021 Prepared by: Grayling Congress  Exercises Seated Upper Trapezius Stretch - 2 x daily - 7 x weekly - 2 sets - 30 sec hold Gentle Levator Scapulae Stretch - 2 x daily - 7 x weekly - 2 sets - 30 sec hold Sit to Stand with Armchair - 2 x daily - 7 x weekly - 2 sets - 10 reps

## 2021-07-03 NOTE — Therapy (Signed)
[facility] [address]  [place], [place], [address] Phone: [phone]   Fax:  [phone]  Physical Therapy Treatment  Patient Details  Name: [name] MRN: [id number] Date of Birth: [birth year] Referring Provider (PT): Kary Kos, MD   Encounter Date: 07/03/2021   PT End of Session - 07/03/21 1525     Visit Number 4    Number of Visits 13    Date for PT Re-Evaluation 08/05/21    Authorization Type Aetna Medicare    Progress Note Due on Visit 10    PT Start Time 1443    PT Stop Time 1527    PT Time Calculation (min) 44 min    Activity Tolerance Patient tolerated treatment well    Behavior During Therapy [facility] for tasks assessed/performed             Past Medical History:  Diagnosis Date   Allergic rhinitis    Breast cancer ([place]) 10/17/2020   CAD (coronary artery disease)    , s/p CAGB 01/2011- LIMA to LAD patent, all other vein grafts occluded. Circumflex DES 3/13 placed following lateral ischemia on nuclear stress test   Cervical spondylosis    , bulging  discs   Disc herniation    L4-L5, L5-S1   Family history of breast cancer 11/18/2020   Hypertension    Mixed hyperlipidemia    Obesity    Post concussion syndrome    processes information slowly,    Postmenopausal    Prediabetes     Past Surgical History:  Procedure Laterality Date   ABDOMINAL HYSTERECTOMY     ANTERIOR CERVICAL DECOMP/DISCECTOMY FUSION N/A 04/23/2021   Procedure: Anterior Cervical Discectomy Fusion - Cervical Four- Cervical Five - Cervical Five -Cervical Six;  Surgeon: Kary Kos, MD;  Location: [facility];  Service: Neurosurgery;  Laterality: N/A;  Anterior Cervical Discectomy Fusion - Cervical Four- Cervical Five - Cervical Five -Cervical Six   Bone Spur Left    Foot   BREAST BIOPSY     BREAST ENHANCEMENT SURGERY     BREAST IMPLANT EXCHANGE     replaced rupture implant   BREAST LUMPECTOMY WITH RADIOACTIVE SEED  LOCALIZATION Right 12/11/2020   Procedure: RIGHT BREAST LUMPECTOMY WITH RADIOACTIVE SEED LOCALIZATION;  Surgeon: Jovita Kussmaul, MD;  Location: [place];  Service: General;  Laterality: Right;   CARDIAC CATHETERIZATION     COLONOSCOPY     CORONARY ARTERY BYPASS GRAFT     JOINT REPLACEMENT     right total knee replacement   Left Bunionectomy     LEFT HEART CATHETERIZATION WITH CORONARY/GRAFT ANGIOGRAM  02/05/2012   Procedure: LEFT HEART CATHETERIZATION WITH Beatrix Fetters;  Surgeon: Candee Furbish, MD;  Location: [facility] CATH LAB;  Service: Cardiovascular;;   MASTECTOMY W/ SENTINEL NODE BIOPSY Right 02/19/2021   Procedure: RIGHT MASTECTOMY;  Surgeon: Jovita Kussmaul, MD;  Location: [place];  Service: General;  Laterality: Right;  RNFA, PEC BLOCK   PARTIAL HYSTERECTOMY     RE-EXCISION OF BREAST CANCER,SUPERIOR MARGINS Right 01/15/2021   Procedure: RE-EXCISION RIGHT BREAST MEDIAL MARGIN;  Surgeon: Jovita Kussmaul, MD;  Location: [place];  Service: General;  Laterality: Right;   REPLACEMENT TOTAL KNEE Right    SENTINEL NODE BIOPSY Right 02/19/2021   Procedure: SENTINEL LYMPH NODE BIOPSY;  Surgeon: Jovita Kussmaul, MD;  Location: [place];  Service: General;  Laterality: Right;   TONSILLECTOMY     TUBAL LIGATION  Bilateral     There were no vitals filed for this visit.   Subjective Assessment - 07/03/21 1447     Subjective Denies issues with HEP. Found out that the hematoma on her R hip is getting better. Saw. Dr. Saintclair Halsted today who said that she is healing well.    Pertinent History CAD s/p CABG in 2012, breast CA with R mastectomy 2022, HTN, HLD, L bunionectomy, post-concussion syndrome, R TKA    Diagnostic tests none recent    Patient Stated Goals improve balance    Currently in Pain? No/denies                               [facility] Adult PT Treatment/Exercise - 07/03/21 0001       Neck Exercises: Machines for Strengthening   UBE (Upper Arm Bike)  L1.0 x 3 min forward/3 min back      Neck Exercises: Seated   Neck Retraction 10 reps;3 secs    Neck Retraction Limitations good form    Other Seated Exercise cervical extension SNAG 10x to tolerance; R/L cervical rotation SNAG x10 to tolerance   cues for hand placement     Knee/Hip Exercises: Seated   Sit to Sand 1 set;5 reps;with UE support   cueing for set up, glute contraction, forward trunk lean     Neck Exercises: Stretches   Upper Trapezius Stretch Right;Left;1 rep;30 seconds   with strap assist; cueing for hand position   Levator Stretch Right;Left;1 rep;30 seconds   with strap assist; cueing for hand position                   PT Education - 07/03/21 1525     Education Details update to HEP    Person(s) Educated Patient    Methods Explanation;Demonstration;Tactile cues;Verbal cues;Handout    Comprehension Verbalized understanding;Returned demonstration              PT Short Term Goals - 06/25/21 1449       PT SHORT TERM GOAL #1   Title Patient to be independent with initial HEP.    Time 3    Period Weeks    Status Achieved    Target Date 07/15/21               PT Long Term Goals - 06/25/21 1449       PT LONG TERM GOAL #1   Title Patient to be independent with advanced HEP.    Time 6    Period Weeks    Status On-going      PT LONG TERM GOAL #2   Title Patient to demonstrate B UE and LE strength >/=4+/5.    Time 6    Period Weeks    Status On-going      PT LONG TERM GOAL #3   Title Patient to demonstrate cervical AROM WFL and without pain limiting.    Time 6    Period Weeks    Status On-going      PT LONG TERM GOAL #4   Title Patient to report 70% improvement in LBP.    Time 6    Period Weeks    Status On-going                   Plan - 07/03/21 1527     Clinical Impression Statement Patient without new complaints today. Provided cues for safety with transfers d/t patient with  tendency to sit somewhat lopsided on  seat with stand>sit transfers. Good carryover demonstrated. Progressed cervical ROM and postural strengthening ther-ex today. Patient demonstrated limited ROM in all directions but with good tolerance. Worked on STS transfers with cueing for set up, glute contraction, and forward trunk lean. Patient demonstrated difficulty using single arm rest, thus encouraged patient to practice this at home only if husband is standing nearby for max safety. Patient reported understanding and without complaints at end of session.    Personal Factors and Comorbidities Age;Comorbidity 3+;Fitness;Past/Current Experience;Time since onset of injury/illness/exacerbation    Comorbidities CAD s/p CABG in 2012, breast CA with R mastectomy 2022, HTN, HLD, L bunionectomy, post-concussion syndrome, R TKA    Examination-Activity Limitations Sit;Bend;Squat;Carry;Stand;Dressing;Transfers;Hygiene/Grooming;Lift;Locomotion Level;Reach Overhead    Examination-Participation Restrictions Meal Prep;Laundry;Occupation;Yard Work;Driving;Volunteer;Shop;Community Activity;Cleaning;Church    Stability/Clinical Decision Making Stable/Uncomplicated    Rehab Potential Good    PT Frequency 2x / week    PT Duration 6 weeks    PT Treatment/Interventions ADLs/Self Care Home Management;Cryotherapy;Electrical Stimulation;Iontophoresis '4mg'$ /ml Dexamethasone;Moist Heat;Balance training;Therapeutic exercise;Therapeutic activities;Functional mobility training;Stair training;Gait training;Ultrasound;Neuromuscular re-education;Patient/family education;Manual techniques;Taping;Energy conservation;Dry needling;Passive range of motion    PT Next Visit Plan continue to progress LE strengthening, balance, cervical ROM    PT Home Exercise Plan Access Code [email or web address]    Consulted and Agree with Plan of Care Patient             Patient will benefit from skilled therapeutic intervention in order to improve the following deficits and impairments:  Abnormal gait,  Hypomobility, Decreased activity tolerance, Decreased strength, Increased fascial restricitons, Pain, Impaired UE functional use, Decreased balance, Decreased mobility, Difficulty walking, Increased muscle spasms, Improper body mechanics, Decreased range of motion, Postural dysfunction, Impaired flexibility  Visit Diagnosis: Other symptoms and signs involving the musculoskeletal system  Chronic bilateral low back pain without sciatica  Unsteadiness on feet  Cervicalgia     Problem List Patient Active Problem List   Diagnosis Date Noted   Paroxysmal atrial fibrillation ([place]) 05/15/2021   Secondary hypercoagulable state ([place]) 05/15/2021   Myelopathy ([place]) 04/23/2021   Genetic testing 12/04/2020   Family history of breast cancer 11/18/2020   Erythrocytosis 11/14/2020   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  [place], [place], [address] Phone: [phone]   Fax:  [phone]  Name: Kitzie Keebler MRN: [id number] Date of Birth: [birth year]

## 2021-07-04 ENCOUNTER — Other Ambulatory Visit (HOSPITAL_BASED_OUTPATIENT_CLINIC_OR_DEPARTMENT_OTHER): Payer: Self-pay

## 2021-07-04 MED ORDER — COVID-19 MRNA VAC-TRIS(PFIZER) 30 MCG/0.3ML IM SUSP
INTRAMUSCULAR | 0 refills | Status: DC
  Filled 2021-07-04: qty 0.3, 1d supply, fill #0

## 2021-07-07 ENCOUNTER — Ambulatory Visit: Payer: Medicare HMO | Attending: Neurosurgery

## 2021-07-07 ENCOUNTER — Other Ambulatory Visit: Payer: Self-pay

## 2021-07-07 DIAGNOSIS — R29898 Other symptoms and signs involving the musculoskeletal system: Secondary | ICD-10-CM | POA: Insufficient documentation

## 2021-07-07 DIAGNOSIS — M25611 Stiffness of right shoulder, not elsewhere classified: Secondary | ICD-10-CM | POA: Insufficient documentation

## 2021-07-07 DIAGNOSIS — G8929 Other chronic pain: Secondary | ICD-10-CM | POA: Insufficient documentation

## 2021-07-07 DIAGNOSIS — M542 Cervicalgia: Secondary | ICD-10-CM | POA: Diagnosis not present

## 2021-07-07 DIAGNOSIS — M545 Low back pain, unspecified: Secondary | ICD-10-CM | POA: Insufficient documentation

## 2021-07-07 DIAGNOSIS — Z9011 Acquired absence of right breast and nipple: Secondary | ICD-10-CM | POA: Insufficient documentation

## 2021-07-07 DIAGNOSIS — R2681 Unsteadiness on feet: Secondary | ICD-10-CM | POA: Diagnosis not present

## 2021-07-07 NOTE — Therapy (Signed)
[facility] [address]  [place], [place], [address] Phone: [phone]   Fax:  [phone]  Physical Therapy Treatment  Patient Details  Name: [name] MRN: [id number] Date of Birth: 02/18/1946 Referring Provider (PT): Kary Kos, MD   Encounter Date: 07/07/2021   PT End of Session - 07/07/21 1532     Visit Number 5    Number of Visits 13    Date for PT Re-Evaluation 08/05/21    Authorization Type [employer] Medicare    Progress Note Due on Visit 10    PT Start Time 1448    PT Stop Time 1530    PT Time Calculation (min) 42 min    Activity Tolerance Patient tolerated treatment well    Behavior During Therapy [facility] for tasks assessed/performed             Past Medical History:  Diagnosis Date   Allergic rhinitis    Breast cancer ([place]) 10/17/2020   CAD (coronary artery disease)    , s/p CAGB 01/2011- LIMA to LAD patent, all other vein grafts occluded. Circumflex DES 3/13 placed following lateral ischemia on nuclear stress test   Cervical spondylosis    , bulging  discs   Disc herniation    L4-L5, L5-S1   Family history of breast cancer 11/18/2020   Hypertension    Mixed hyperlipidemia    Obesity    Post concussion syndrome    processes information slowly,    Postmenopausal    Prediabetes     Past Surgical History:  Procedure Laterality Date   ABDOMINAL HYSTERECTOMY     ANTERIOR CERVICAL DECOMP/DISCECTOMY FUSION N/A 04/23/2021   Procedure: Anterior Cervical Discectomy Fusion - Cervical Four- Cervical Five - Cervical Five -Cervical Six;  Surgeon: Kary Kos, MD;  Location: [place];  Service: Neurosurgery;  Laterality: N/A;  Anterior Cervical Discectomy Fusion - Cervical Four- Cervical Five - Cervical Five -Cervical Six   Bone Spur Left    Foot   BREAST BIOPSY     BREAST ENHANCEMENT SURGERY     BREAST IMPLANT EXCHANGE     replaced rupture implant   BREAST LUMPECTOMY WITH RADIOACTIVE SEED  LOCALIZATION Right 12/11/2020   Procedure: RIGHT BREAST LUMPECTOMY WITH RADIOACTIVE SEED LOCALIZATION;  Surgeon: Jovita Kussmaul, MD;  Location: [place];  Service: General;  Laterality: Right;   CARDIAC CATHETERIZATION     COLONOSCOPY     CORONARY ARTERY BYPASS GRAFT     JOINT REPLACEMENT     right total knee replacement   Left Bunionectomy     LEFT HEART CATHETERIZATION WITH CORONARY/GRAFT ANGIOGRAM  02/05/2012   Procedure: LEFT HEART CATHETERIZATION WITH Beatrix Fetters;  Surgeon: Candee Furbish, MD;  Location: [facility] CATH LAB;  Service: Cardiovascular;;   MASTECTOMY W/ SENTINEL NODE BIOPSY Right 02/19/2021   Procedure: RIGHT MASTECTOMY;  Surgeon: Jovita Kussmaul, MD;  Location: [place];  Service: General;  Laterality: Right;  RNFA, PEC BLOCK   PARTIAL HYSTERECTOMY     RE-EXCISION OF BREAST CANCER,SUPERIOR MARGINS Right 01/15/2021   Procedure: RE-EXCISION RIGHT BREAST MEDIAL MARGIN;  Surgeon: Jovita Kussmaul, MD;  Location: [place];  Service: General;  Laterality: Right;   REPLACEMENT TOTAL KNEE Right    SENTINEL NODE BIOPSY Right 02/19/2021   Procedure: SENTINEL LYMPH NODE BIOPSY;  Surgeon: Jovita Kussmaul, MD;  Location: [place];  Service: General;  Laterality: Right;   TONSILLECTOMY     TUBAL LIGATION  Bilateral     There were no vitals filed for this visit.   Subjective Assessment - 07/07/21 1450     Subjective Pt notes that she is doing good, did exercises earlier today.    Pertinent History CAD s/p CABG in 2012, breast CA with R mastectomy 2022, HTN, HLD, L bunionectomy, post-concussion syndrome, R TKA    Diagnostic tests none recent    Patient Stated Goals improve balance    Currently in Pain? No/denies                               [facility] Adult PT Treatment/Exercise - 07/07/21 0001       Lumbar Exercises: Aerobic   Nustep L5 x 6 min      Knee/Hip Exercises: Standing   Hip Abduction Stengthening;Both;2 sets;10 reps;Knee  straight    Abduction Limitations 2#, UE support    Other Standing Knee Exercises marches with UE support 2x10; 2#      Knee/Hip Exercises: Seated   Long Arc Quad Strengthening;Both;10 reps;Weights    Long Arc Quad Weight 2 lbs.    Other Seated Knee/Hip Exercises APs 20x B    Hamstring Curl Strengthening;Both;10 reps    Hamstring Limitations red TB    Sit to Sand 10 reps;with UE support                      PT Short Term Goals - 06/25/21 1449       PT SHORT TERM GOAL #1   Title Patient to be independent with initial HEP.    Time 3    Period Weeks    Status Achieved    Target Date 07/15/21               PT Long Term Goals - 06/25/21 1449       PT LONG TERM GOAL #1   Title Patient to be independent with advanced HEP.    Time 6    Period Weeks    Status On-going      PT LONG TERM GOAL #2   Title Patient to demonstrate B UE and LE strength >/=4+/5.    Time 6    Period Weeks    Status On-going      PT LONG TERM GOAL #3   Title Patient to demonstrate cervical AROM WFL and without pain limiting.    Time 6    Period Weeks    Status On-going      PT LONG TERM GOAL #4   Title Patient to report 70% improvement in LBP.    Time 6    Period Weeks    Status On-going                   Plan - 07/07/21 1532     Clinical Impression Statement Pt responded well to treatment. Progressed LE strengthening to pt tolerance. She overall demonstrates slow exercises performance and guarded movements. She demonstrated decreased ROM with the hs curls due to weakness. Cues during STS required for controlled movement and UE use to help with control. Did AP today due her reporting continued swelling in her feet for which she went to a doctor for. Pt would continue to benefit from general strengthening to improve stability with gait and exercises to reduce guarding.    Personal Factors and Comorbidities Age;Comorbidity 3+;Fitness;Past/Current Experience;Time since  onset of injury/illness/exacerbation    Comorbidities CAD s/p  CABG in 2012, breast CA with R mastectomy 2022, HTN, HLD, L bunionectomy, post-concussion syndrome, R TKA    PT Frequency 2x / week    PT Duration 6 weeks    PT Treatment/Interventions ADLs/Self Care Home Management;Cryotherapy;Electrical Stimulation;Iontophoresis '4mg'$ /ml Dexamethasone;Moist Heat;Balance training;Therapeutic exercise;Therapeutic activities;Functional mobility training;Stair training;Gait training;Ultrasound;Neuromuscular re-education;Patient/family education;Manual techniques;Taping;Energy conservation;Dry needling;Passive range of motion    PT Next Visit Plan continue to progress LE strengthening, balance, cervical ROM    PT Home Exercise Plan Access Code [email or web address]    Consulted and Agree with Plan of Care Patient             Patient will benefit from skilled therapeutic intervention in order to improve the following deficits and impairments:  Abnormal gait, Hypomobility, Decreased activity tolerance, Decreased strength, Increased fascial restricitons, Pain, Impaired UE functional use, Decreased balance, Decreased mobility, Difficulty walking, Increased muscle spasms, Improper body mechanics, Decreased range of motion, Postural dysfunction, Impaired flexibility  Visit Diagnosis: Other symptoms and signs involving the musculoskeletal system  Chronic bilateral low back pain without sciatica  Unsteadiness on feet  Cervicalgia     Problem List Patient Active Problem List   Diagnosis Date Noted   Paroxysmal atrial fibrillation ([place]) 05/15/2021   Secondary hypercoagulable state ([place]) 05/15/2021   Myelopathy ([place]) 04/23/2021   Genetic testing 12/04/2020   Family history of breast cancer 11/18/2020   Erythrocytosis 11/14/2020   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

## 2021-07-10 ENCOUNTER — Ambulatory Visit: Payer: Medicare HMO

## 2021-07-10 ENCOUNTER — Other Ambulatory Visit: Payer: Self-pay

## 2021-07-10 DIAGNOSIS — R2681 Unsteadiness on feet: Secondary | ICD-10-CM

## 2021-07-10 DIAGNOSIS — Z9011 Acquired absence of right breast and nipple: Secondary | ICD-10-CM | POA: Diagnosis not present

## 2021-07-10 DIAGNOSIS — M542 Cervicalgia: Secondary | ICD-10-CM | POA: Diagnosis not present

## 2021-07-10 DIAGNOSIS — M25611 Stiffness of right shoulder, not elsewhere classified: Secondary | ICD-10-CM | POA: Diagnosis not present

## 2021-07-10 DIAGNOSIS — M545 Low back pain, unspecified: Secondary | ICD-10-CM | POA: Diagnosis not present

## 2021-07-10 DIAGNOSIS — G8929 Other chronic pain: Secondary | ICD-10-CM

## 2021-07-10 DIAGNOSIS — R29898 Other symptoms and signs involving the musculoskeletal system: Secondary | ICD-10-CM

## 2021-07-10 NOTE — Therapy (Signed)
[facility] [address]  [place], [place], [address] Phone: [phone]   Fax:  [phone]  Physical Therapy Treatment  Patient Details  Name: [name] MRN: [id number] Date of Birth: 05-06-1946 Referring Provider (PT): Kary Kos, MD   Encounter Date: 07/10/2021   PT End of Session - 07/10/21 1445     Visit Number 6    Number of Visits 13    Date for PT Re-Evaluation 08/05/21    Authorization Type Aetna Medicare    Progress Note Due on Visit 10    PT Start Time 1402    PT Stop Time 1443    PT Time Calculation (min) 41 min    Activity Tolerance Patient tolerated treatment well;Patient limited by fatigue    Behavior During Therapy [facility] for tasks assessed/performed             Past Medical History:  Diagnosis Date   Allergic rhinitis    Breast cancer ([place]) 10/17/2020   CAD (coronary artery disease)    , s/p CAGB 01/2011- LIMA to LAD patent, all other vein grafts occluded. Circumflex DES 3/13 placed following lateral ischemia on nuclear stress test   Cervical spondylosis    , bulging  discs   Disc herniation    L4-L5, L5-S1   Family history of breast cancer 11/18/2020   Hypertension    Mixed hyperlipidemia    Obesity    Post concussion syndrome    processes information slowly,    Postmenopausal    Prediabetes     Past Surgical History:  Procedure Laterality Date   ABDOMINAL HYSTERECTOMY     ANTERIOR CERVICAL DECOMP/DISCECTOMY FUSION N/A 04/23/2021   Procedure: Anterior Cervical Discectomy Fusion - Cervical Four- Cervical Five - Cervical Five -Cervical Six;  Surgeon: Kary Kos, MD;  Location: [place];  Service: Neurosurgery;  Laterality: N/A;  Anterior Cervical Discectomy Fusion - Cervical Four- Cervical Five - Cervical Five -Cervical Six   Bone Spur Left    Foot   BREAST BIOPSY     BREAST ENHANCEMENT SURGERY     BREAST IMPLANT EXCHANGE     replaced rupture implant   BREAST LUMPECTOMY  WITH RADIOACTIVE SEED LOCALIZATION Right 12/11/2020   Procedure: RIGHT BREAST LUMPECTOMY WITH RADIOACTIVE SEED LOCALIZATION;  Surgeon: Jovita Kussmaul, MD;  Location: [place];  Service: General;  Laterality: Right;   CARDIAC CATHETERIZATION     COLONOSCOPY     CORONARY ARTERY BYPASS GRAFT     JOINT REPLACEMENT     right total knee replacement   Left Bunionectomy     LEFT HEART CATHETERIZATION WITH CORONARY/GRAFT ANGIOGRAM  02/05/2012   Procedure: LEFT HEART CATHETERIZATION WITH Beatrix Fetters;  Surgeon: Candee Furbish, MD;  Location: [facility] CATH LAB;  Service: Cardiovascular;;   MASTECTOMY W/ SENTINEL NODE BIOPSY Right 02/19/2021   Procedure: RIGHT MASTECTOMY;  Surgeon: Jovita Kussmaul, MD;  Location: [place];  Service: General;  Laterality: Right;  RNFA, PEC BLOCK   PARTIAL HYSTERECTOMY     RE-EXCISION OF BREAST CANCER,SUPERIOR MARGINS Right 01/15/2021   Procedure: RE-EXCISION RIGHT BREAST MEDIAL MARGIN;  Surgeon: Jovita Kussmaul, MD;  Location: [place];  Service: General;  Laterality: Right;   REPLACEMENT TOTAL KNEE Right    SENTINEL NODE BIOPSY Right 02/19/2021   Procedure: SENTINEL LYMPH NODE BIOPSY;  Surgeon: Jovita Kussmaul, MD;  Location: [place];  Service: General;  Laterality: Right;   TONSILLECTOMY  TUBAL LIGATION Bilateral     There were no vitals filed for this visit.   Subjective Assessment - 07/10/21 1405     Subjective Doing good, feels more safe with walker.    Pertinent History CAD s/p CABG in 2012, breast CA with R mastectomy 2022, HTN, HLD, L bunionectomy, post-concussion syndrome, R TKA    Diagnostic tests none recent    Patient Stated Goals improve balance    Currently in Pain? No/denies                               [facility] Adult PT Treatment/Exercise - 07/10/21 0001       Lumbar Exercises: Aerobic   Nustep L5 x 6 min      Lumbar Exercises: Seated   Sit to Stand 5 reps    Sit to Stand Limitations 5 reps no UE  support; 5 reps with UE support    Other Seated Lumbar Exercises chest press and OH press with blue medicine ball 10 reps      Knee/Hip Exercises: Standing   Hip Abduction Stengthening;Both;10 reps;Knee straight    Abduction Limitations red TB, UE support      Knee/Hip Exercises: Seated   Marching Strengthening;Both;2 sets;10 reps    Abduction/Adduction  Strengthening;Both;10 reps    Abd/Adduction Limitations red TB                      PT Short Term Goals - 06/25/21 1449       PT SHORT TERM GOAL #1   Title Patient to be independent with initial HEP.    Time 3    Period Weeks    Status Achieved    Target Date 07/15/21               PT Long Term Goals - 06/25/21 1449       PT LONG TERM GOAL #1   Title Patient to be independent with advanced HEP.    Time 6    Period Weeks    Status Partially Met     PT LONG TERM GOAL #2   Title Patient to demonstrate B UE and LE strength >/=4+/5.    Time 6    Period Weeks    Status On-going      PT LONG TERM GOAL #3   Title Patient to demonstrate cervical AROM WFL and without pain limiting.    Time 6    Period Weeks    Status On-going      PT LONG TERM GOAL #4   Title Patient to report 70% improvement in LBP.    Time 6    Period Weeks    Status On-going                   Plan - 07/10/21 1446     Clinical Impression Statement Pt demonstrates decreased fatigue and tolerance for standing ther ex. She completed exercises with slow and cautious movements. Required cues and encouragement to complete full STS w/o UE. Seated breaks required after resisted hip exercises due to fatigue. Pt responded well but needs more work on increasing endurance.    Personal Factors and Comorbidities Age;Comorbidity 3+;Fitness;Past/Current Experience;Time since onset of injury/illness/exacerbation    Comorbidities CAD s/p CABG in 2012, breast CA with R mastectomy 2022, HTN, HLD, L bunionectomy, post-concussion syndrome, R TKA     PT Frequency 2x / week    PT  Duration 6 weeks    PT Treatment/Interventions ADLs/Self Care Home Management;Cryotherapy;Electrical Stimulation;Iontophoresis 86m/ml Dexamethasone;Moist Heat;Balance training;Therapeutic exercise;Therapeutic activities;Functional mobility training;Stair training;Gait training;Ultrasound;Neuromuscular re-education;Patient/family education;Manual techniques;Taping;Energy conservation;Dry needling;Passive range of motion    PT Next Visit Plan continue to progress LE strengthening, balance, cervical ROM    PT Home Exercise Plan Access Code [email or web address]   Consulted and Agree with Plan of Care Patient             Patient will benefit from skilled therapeutic intervention in order to improve the following deficits and impairments:  Abnormal gait, Hypomobility, Decreased activity tolerance, Decreased strength, Increased fascial restricitons, Pain, Impaired UE functional use, Decreased balance, Decreased mobility, Difficulty walking, Increased muscle spasms, Improper body mechanics, Decreased range of motion, Postural dysfunction, Impaired flexibility  Visit Diagnosis: Other symptoms and signs involving the musculoskeletal system  Chronic bilateral low back pain without sciatica  Unsteadiness on feet  Cervicalgia     Problem List Patient Active Problem List   Diagnosis Date Noted   Paroxysmal atrial fibrillation ([place] 05/15/2021   Secondary hypercoagulable state ([place] 05/15/2021   Myelopathy ([place] 04/23/2021   Genetic testing 12/04/2020   Family history of breast cancer 11/18/2020   Erythrocytosis 11/14/2020   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

## 2021-07-14 ENCOUNTER — Other Ambulatory Visit: Payer: Self-pay

## 2021-07-14 ENCOUNTER — Ambulatory Visit: Payer: Medicare HMO

## 2021-07-14 DIAGNOSIS — G8929 Other chronic pain: Secondary | ICD-10-CM

## 2021-07-14 DIAGNOSIS — R29898 Other symptoms and signs involving the musculoskeletal system: Secondary | ICD-10-CM | POA: Diagnosis not present

## 2021-07-14 DIAGNOSIS — M545 Low back pain, unspecified: Secondary | ICD-10-CM | POA: Diagnosis not present

## 2021-07-14 DIAGNOSIS — R2681 Unsteadiness on feet: Secondary | ICD-10-CM

## 2021-07-14 DIAGNOSIS — M542 Cervicalgia: Secondary | ICD-10-CM | POA: Diagnosis not present

## 2021-07-14 DIAGNOSIS — Z9011 Acquired absence of right breast and nipple: Secondary | ICD-10-CM | POA: Diagnosis not present

## 2021-07-14 DIAGNOSIS — M25611 Stiffness of right shoulder, not elsewhere classified: Secondary | ICD-10-CM | POA: Diagnosis not present

## 2021-07-14 NOTE — Therapy (Signed)
[name] [address]  [place], [place], [address] Phone: [phone]   Fax:  [phone]  Physical Therapy Treatment  Patient Details  Name: [name] MRN: [id number] Date of Birth: Oct 08, 1946 Referring Provider (PT): Kary Kos, MD   Encounter Date: 07/14/2021   PT End of Session - 07/14/21 1532     Visit Number 7    Number of Visits 13    Date for PT Re-Evaluation 08/05/21    Authorization Type Aetna Medicare    Progress Note Due on Visit 10    PT Start Time 1447    PT Stop Time 1526    PT Time Calculation (min) 39 min    Activity Tolerance Patient tolerated treatment well;Patient limited by fatigue    Behavior During Therapy [facility] for tasks assessed/performed             Past Medical History:  Diagnosis Date   Allergic rhinitis    Breast cancer ([place]) 10/17/2020   CAD (coronary artery disease)    , s/p CAGB 01/2011- LIMA to LAD patent, all other vein grafts occluded. Circumflex DES 3/13 placed following lateral ischemia on nuclear stress test   Cervical spondylosis    , bulging  discs   Disc herniation    L4-L5, L5-S1   Family history of breast cancer 11/18/2020   Hypertension    Mixed hyperlipidemia    Obesity    Post concussion syndrome    processes information slowly,    Postmenopausal    Prediabetes     Past Surgical History:  Procedure Laterality Date   ABDOMINAL HYSTERECTOMY     ANTERIOR CERVICAL DECOMP/DISCECTOMY FUSION N/A 04/23/2021   Procedure: Anterior Cervical Discectomy Fusion - Cervical Four- Cervical Five - Cervical Five -Cervical Six;  Surgeon: Kary Kos, MD;  Location: [place];  Service: Neurosurgery;  Laterality: N/A;  Anterior Cervical Discectomy Fusion - Cervical Four- Cervical Five - Cervical Five -Cervical Six   Bone Spur Left    Foot   BREAST BIOPSY     BREAST ENHANCEMENT SURGERY     BREAST IMPLANT EXCHANGE     replaced rupture implant   BREAST LUMPECTOMY  WITH RADIOACTIVE SEED LOCALIZATION Right 12/11/2020   Procedure: RIGHT BREAST LUMPECTOMY WITH RADIOACTIVE SEED LOCALIZATION;  Surgeon: Jovita Kussmaul, MD;  Location: [place];  Service: General;  Laterality: Right;   CARDIAC CATHETERIZATION     COLONOSCOPY     CORONARY ARTERY BYPASS GRAFT     JOINT REPLACEMENT     right total knee replacement   Left Bunionectomy     LEFT HEART CATHETERIZATION WITH CORONARY/GRAFT ANGIOGRAM  02/05/2012   Procedure: LEFT HEART CATHETERIZATION WITH Beatrix Fetters;  Surgeon: Candee Furbish, MD;  Location: [facility] CATH LAB;  Service: Cardiovascular;;   MASTECTOMY W/ SENTINEL NODE BIOPSY Right 02/19/2021   Procedure: RIGHT MASTECTOMY;  Surgeon: Jovita Kussmaul, MD;  Location: [place];  Service: General;  Laterality: Right;  RNFA, PEC BLOCK   PARTIAL HYSTERECTOMY     RE-EXCISION OF BREAST CANCER,SUPERIOR MARGINS Right 01/15/2021   Procedure: RE-EXCISION RIGHT BREAST MEDIAL MARGIN;  Surgeon: Jovita Kussmaul, MD;  Location: [place];  Service: General;  Laterality: Right;   REPLACEMENT TOTAL KNEE Right    SENTINEL NODE BIOPSY Right 02/19/2021   Procedure: SENTINEL LYMPH NODE BIOPSY;  Surgeon: Jovita Kussmaul, MD;  Location: [place];  Service: General;  Laterality: Right;   TONSILLECTOMY  TUBAL LIGATION Bilateral     There were no vitals filed for this visit.   Subjective Assessment - 07/14/21 1454     Subjective Pt notes that she is able to do STS w/o UE at times but sometimes required UE support.    Pertinent History CAD s/p CABG in 2012, breast CA with R mastectomy 2022, HTN, HLD, L bunionectomy, post-concussion syndrome, R TKA    Diagnostic tests none recent    Patient Stated Goals improve balance    Currently in Pain? No/denies                               [facility] Adult PT Treatment/Exercise - 07/14/21 0001       Ambulation/Gait   Ambulation/Gait Yes    Ambulation Distance (Feet) 110 Feet    Assistive  device Rolling walker    Gait Pattern Step-to pattern;Step-through pattern;Decreased step length - right;Decreased step length - left      Neck Exercises: Seated   Other Seated Exercise horizontal ABD and ER 10 reps each with red TB      Knee/Hip Exercises: Seated   Ball Squeeze 10x5"    Marching Strengthening;Both;2 sets;10 reps    Marching Limitations green TB    Hamstring Curl Strengthening;Both;10 reps    Hamstring Limitations green TB    Abduction/Adduction  Strengthening;Both;20 reps    Abd/Adduction Limitations green TB      Modalities   Modalities --                    PT Education - 07/14/21 1533     Education Details edu on reaching out to doctor to discuss edema in feet    Person(s) Educated Patient    Methods Explanation;Demonstration    Comprehension Verbalized understanding;Returned demonstration              PT Short Term Goals - 06/25/21 1449       PT SHORT TERM GOAL #1   Title Patient to be independent with initial HEP.    Time 3    Period Weeks    Status Achieved    Target Date 07/15/21               PT Long Term Goals - 06/25/21 1449       PT LONG TERM GOAL #1   Title Patient to be independent with advanced HEP.    Time 6    Period Weeks    Status Partially Met     PT LONG TERM GOAL #2   Title Patient to demonstrate B UE and LE strength >/=4+/5.    Time 6    Period Weeks    Status On-going      PT LONG TERM GOAL #3   Title Patient to demonstrate cervical AROM WFL and without pain limiting.    Time 6    Period Weeks    Status On-going      PT LONG TERM GOAL #4   Title Patient to report 70% improvement in LBP.    Time 6    Period Weeks    Status On-going                   Plan - 07/14/21 1536     Clinical Impression Statement Pt had no complaints with exercises. Avoided WB exercises today because pt did not have on the proper foot wear today. Cues with exercises  to avoid compensations with trunk but  overall she responded well. She inquired about what to do for edema in her feet, I educated her on ankle exercises to help and encouraged her to reach out to a doctor for consult. A lot of session was edu on seeking a doctor for foot edema and ankle exercises to help reduce edema.    Personal Factors and Comorbidities Age;Comorbidity 3+;Fitness;Past/Current Experience;Time since onset of injury/illness/exacerbation    Comorbidities CAD s/p CABG in 2012, breast CA with R mastectomy 2022, HTN, HLD, L bunionectomy, post-concussion syndrome, R TKA    PT Frequency 2x / week    PT Duration 6 weeks    PT Treatment/Interventions ADLs/Self Care Home Management;Cryotherapy;Electrical Stimulation;Iontophoresis 28m/ml Dexamethasone;Moist Heat;Balance training;Therapeutic exercise;Therapeutic activities;Functional mobility training;Stair training;Gait training;Ultrasound;Neuromuscular re-education;Patient/family education;Manual techniques;Taping;Energy conservation;Dry needling;Passive range of motion    PT Next Visit Plan continue to progress LE strengthening, balance, cervical ROM    PT Home Exercise Plan Access Code [email or web address]   Consulted and Agree with Plan of Care Patient             Patient will benefit from skilled therapeutic intervention in order to improve the following deficits and impairments:  Abnormal gait, Hypomobility, Decreased activity tolerance, Decreased strength, Increased fascial restricitons, Pain, Impaired UE functional use, Decreased balance, Decreased mobility, Difficulty walking, Increased muscle spasms, Improper body mechanics, Decreased range of motion, Postural dysfunction, Impaired flexibility  Visit Diagnosis: Other symptoms and signs involving the musculoskeletal system  Chronic bilateral low back pain without sciatica  Unsteadiness on feet  Cervicalgia     Problem List Patient Active Problem List   Diagnosis Date Noted   Paroxysmal atrial fibrillation ([place]  05/15/2021   Secondary hypercoagulable state ([place] 05/15/2021   Myelopathy ([place] 04/23/2021   Genetic testing 12/04/2020   Family history of breast cancer 11/18/2020   Erythrocytosis 11/14/2020   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 [place] [address]Phone: [phone]  Fax:  [phone] Name: BVina ByrdMRN: [id number] of Birth: [birth year]

## 2021-07-21 ENCOUNTER — Ambulatory Visit: Payer: Medicare HMO

## 2021-07-21 ENCOUNTER — Other Ambulatory Visit: Payer: Self-pay

## 2021-07-21 DIAGNOSIS — R2681 Unsteadiness on feet: Secondary | ICD-10-CM

## 2021-07-21 DIAGNOSIS — M542 Cervicalgia: Secondary | ICD-10-CM

## 2021-07-21 DIAGNOSIS — Z9011 Acquired absence of right breast and nipple: Secondary | ICD-10-CM | POA: Diagnosis not present

## 2021-07-21 DIAGNOSIS — M545 Low back pain, unspecified: Secondary | ICD-10-CM

## 2021-07-21 DIAGNOSIS — G8929 Other chronic pain: Secondary | ICD-10-CM

## 2021-07-21 DIAGNOSIS — M25611 Stiffness of right shoulder, not elsewhere classified: Secondary | ICD-10-CM | POA: Diagnosis not present

## 2021-07-21 DIAGNOSIS — R29898 Other symptoms and signs involving the musculoskeletal system: Secondary | ICD-10-CM | POA: Diagnosis not present

## 2021-07-21 NOTE — Therapy (Signed)
[facility] [address]  [place], [place], [address] Phone: [phone]   Fax:  [phone]  Physical Therapy Treatment  Patient Details  Name: [name] MRN: [id number] Date of Birth: 09/20/1946 Referring Provider (PT): Kary Kos, MD   Encounter Date: 07/21/2021   PT End of Session - 07/21/21 1527     Visit Number 8    Number of Visits 13    Date for PT Re-Evaluation 08/05/21    Authorization Type [employer]    Progress Note Due on Visit 10    PT Start Time 1446    PT Stop Time 1527    PT Time Calculation (min) 41 min    Activity Tolerance Patient tolerated treatment well    Behavior During Therapy [facility] for tasks assessed/performed             Past Medical History:  Diagnosis Date   Allergic rhinitis    Breast cancer ([place]) 10/17/2020   CAD (coronary artery disease)    , s/p CAGB 01/2011- LIMA to LAD patent, all other vein grafts occluded. Circumflex DES 3/13 placed following lateral ischemia on nuclear stress test   Cervical spondylosis    , bulging  discs   Disc herniation    L4-L5, L5-S1   Family history of breast cancer 11/18/2020   Hypertension    Mixed hyperlipidemia    Obesity    Post concussion syndrome    processes information slowly,    Postmenopausal    Prediabetes     Past Surgical History:  Procedure Laterality Date   ABDOMINAL HYSTERECTOMY     ANTERIOR CERVICAL DECOMP/DISCECTOMY FUSION N/A 04/23/2021   Procedure: Anterior Cervical Discectomy Fusion - Cervical Four- Cervical Five - Cervical Five -Cervical Six;  Surgeon: Kary Kos, MD;  Location: [place];  Service: Neurosurgery;  Laterality: N/A;  Anterior Cervical Discectomy Fusion - Cervical Four- Cervical Five - Cervical Five -Cervical Six   Bone Spur Left    Foot   BREAST BIOPSY     BREAST ENHANCEMENT SURGERY     BREAST IMPLANT EXCHANGE     replaced rupture implant   BREAST LUMPECTOMY WITH RADIOACTIVE SEED  LOCALIZATION Right 12/11/2020   Procedure: RIGHT BREAST LUMPECTOMY WITH RADIOACTIVE SEED LOCALIZATION;  Surgeon: Jovita Kussmaul, MD;  Location: [place];  Service: General;  Laterality: Right;   CARDIAC CATHETERIZATION     COLONOSCOPY     CORONARY ARTERY BYPASS GRAFT     JOINT REPLACEMENT     right total knee replacement   Left Bunionectomy     LEFT HEART CATHETERIZATION WITH CORONARY/GRAFT ANGIOGRAM  02/05/2012   Procedure: LEFT HEART CATHETERIZATION WITH Beatrix Fetters;  Surgeon: Candee Furbish, MD;  Location: [facility] CATH LAB;  Service: Cardiovascular;;   MASTECTOMY W/ SENTINEL NODE BIOPSY Right 02/19/2021   Procedure: RIGHT MASTECTOMY;  Surgeon: Jovita Kussmaul, MD;  Location: [place];  Service: General;  Laterality: Right;  RNFA, PEC BLOCK   PARTIAL HYSTERECTOMY     RE-EXCISION OF BREAST CANCER,SUPERIOR MARGINS Right 01/15/2021   Procedure: RE-EXCISION RIGHT BREAST MEDIAL MARGIN;  Surgeon: Jovita Kussmaul, MD;  Location: [place];  Service: General;  Laterality: Right;   REPLACEMENT TOTAL KNEE Right    SENTINEL NODE BIOPSY Right 02/19/2021   Procedure: SENTINEL LYMPH NODE BIOPSY;  Surgeon: Jovita Kussmaul, MD;  Location: [place];  Service: General;  Laterality: Right;   TONSILLECTOMY     TUBAL LIGATION  Bilateral     There were no vitals filed for this visit.   Subjective Assessment - 07/21/21 1449     Subjective Reports that she has not fallen recently but sometimes forgets to use her walker at home.    Pertinent History CAD s/p CABG in 2012, breast CA with R mastectomy 2022, HTN, HLD, L bunionectomy, post-concussion syndrome, R TKA    Diagnostic tests none recent    Patient Stated Goals improve balance    Currently in Pain? No/denies                [facility] PT Assessment - 07/21/21 0001       Strength   Right Hip Flexion 4+/5    Right Hip ABduction 4+/5    Right Hip ADduction 4/5    Left Hip Flexion 4+/5    Left Hip ABduction 4+/5    Left Hip  ADduction 4+/5    Right Knee Flexion 4/5    Right Knee Extension 4/5    Left Knee Flexion 4/5    Left Knee Extension 4+/5                           [facility] Adult PT Treatment/Exercise - 07/21/21 0001       Exercises   Exercises Knee/Hip;Ankle      Knee/Hip Exercises: Standing   Heel Raises Both;10 reps    Heel Raises Limitations counter support    Knee Flexion Strengthening;Both;10 reps    Knee Flexion Limitations 2# with counter support    Hip Extension Stengthening;Both;10 reps;Knee bent    Extension Limitations 2#    Functional Squat 10 reps    Functional Squat Limitations counter support; cues for equal foot position      Ankle Exercises: Seated   Heel Raises Both;20 reps    Toe Raise 20 reps    Other Seated Ankle Exercises AP 20x bilat                      PT Short Term Goals - 06/25/21 1449       PT SHORT TERM GOAL #1   Title Patient to be independent with initial HEP.    Time 3    Period Weeks    Status Achieved    Target Date 07/15/21               PT Long Term Goals - 07/21/21 1459       PT LONG TERM GOAL #1   Title Patient to be independent with advanced HEP.    Time 6    Period Weeks    Status On-going      PT LONG TERM GOAL #2   Title Patient to demonstrate B UE and LE strength >/=4+/5.    Time 6    Period Weeks    Status On-going   LE strength still limited but improving     PT LONG TERM GOAL #3   Title Patient to demonstrate cervical AROM WFL and without pain limiting.    Time 6    Period Weeks    Status On-going      PT LONG TERM GOAL #4   Title Patient to report 70% improvement in LBP.    Time 6    Period Weeks    Status On-going                   Plan - 07/21/21 1529  Clinical Impression Statement Pt responded well. Strength test today reveal improvements but most limitations with quad and hs weakness. Incorporated ankle ROM exercises due to edema in her feet. She showed a good  tolerance for ther ex today, cues required intermittenly during session for form. Advised her to keep her AD with her at all times for safety. Will plan to address cervical ROM next session.    Personal Factors and Comorbidities Age;Comorbidity 3+;Fitness;Past/Current Experience;Time since onset of injury/illness/exacerbation    Comorbidities CAD s/p CABG in 2012, breast CA with R mastectomy 2022, HTN, HLD, L bunionectomy, post-concussion syndrome, R TKA    Examination-Activity Limitations Sit;Bend;Squat;Carry;Stand;Dressing;Transfers;Hygiene/Grooming;Lift;Locomotion Level;Reach Overhead    PT Frequency 2x / week    PT Duration 6 weeks    PT Treatment/Interventions ADLs/Self Care Home Management;Cryotherapy;Electrical Stimulation;Iontophoresis '4mg'$ /ml Dexamethasone;Moist Heat;Balance training;Therapeutic exercise;Therapeutic activities;Functional mobility training;Stair training;Gait training;Ultrasound;Neuromuscular re-education;Patient/family education;Manual techniques;Taping;Energy conservation;Dry needling;Passive range of motion    PT Next Visit Plan assess HEP; continue to progress LE strengthening, balance, cervical ROM    PT Home Exercise Plan Access Code [email or web address]    Consulted and Agree with Plan of Care Patient             Patient will benefit from skilled therapeutic intervention in order to improve the following deficits and impairments:  Abnormal gait, Hypomobility, Decreased activity tolerance, Decreased strength, Increased fascial restricitons, Pain, Impaired UE functional use, Decreased balance, Decreased mobility, Difficulty walking, Increased muscle spasms, Improper body mechanics, Decreased range of motion, Postural dysfunction, Impaired flexibility  Visit Diagnosis: Other symptoms and signs involving the musculoskeletal system  Chronic bilateral low back pain without sciatica  Unsteadiness on feet  Cervicalgia     Problem List Patient Active Problem List    Diagnosis Date Noted   Paroxysmal atrial fibrillation ([place]) 05/15/2021   Secondary hypercoagulable state ([place]) 05/15/2021   Myelopathy ([place]) 04/23/2021   Genetic testing 12/04/2020   Family history of breast cancer 11/18/2020   Erythrocytosis 11/14/2020   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

## 2021-07-24 ENCOUNTER — Ambulatory Visit: Payer: Medicare HMO

## 2021-07-24 ENCOUNTER — Other Ambulatory Visit: Payer: Self-pay

## 2021-07-24 DIAGNOSIS — M25611 Stiffness of right shoulder, not elsewhere classified: Secondary | ICD-10-CM | POA: Diagnosis not present

## 2021-07-24 DIAGNOSIS — R29898 Other symptoms and signs involving the musculoskeletal system: Secondary | ICD-10-CM

## 2021-07-24 DIAGNOSIS — R2681 Unsteadiness on feet: Secondary | ICD-10-CM | POA: Diagnosis not present

## 2021-07-24 DIAGNOSIS — G8929 Other chronic pain: Secondary | ICD-10-CM | POA: Diagnosis not present

## 2021-07-24 DIAGNOSIS — M542 Cervicalgia: Secondary | ICD-10-CM | POA: Diagnosis not present

## 2021-07-24 DIAGNOSIS — Z9011 Acquired absence of right breast and nipple: Secondary | ICD-10-CM | POA: Diagnosis not present

## 2021-07-24 DIAGNOSIS — M545 Low back pain, unspecified: Secondary | ICD-10-CM | POA: Diagnosis not present

## 2021-07-24 NOTE — Therapy (Signed)
[facility] [address]  [place], [place], [address] Phone: [phone]   Fax:  [phone]  Physical Therapy Treatment  Patient Details  Name: [name] MRN: [id number] Date of Birth: 01-21-1946 Referring Provider (PT): Kary Kos, MD   Encounter Date: 07/24/2021   PT End of Session - 07/24/21 1443     Visit Number 9    Number of Visits 13    Date for PT Re-Evaluation 08/05/21    Authorization Type [employer] Medicare    Progress Note Due on Visit 10    PT Start Time 1402    PT Stop Time 1442    PT Time Calculation (min) 40 min    Activity Tolerance Patient tolerated treatment well    Behavior During Therapy [facility] for tasks assessed/performed             Past Medical History:  Diagnosis Date   Allergic rhinitis    Breast cancer ([place]) 10/17/2020   CAD (coronary artery disease)    , s/p CAGB 01/2011- LIMA to LAD patent, all other vein grafts occluded. Circumflex DES 3/13 placed following lateral ischemia on nuclear stress test   Cervical spondylosis    , bulging  discs   Disc herniation    L4-L5, L5-S1   Family history of breast cancer 11/18/2020   Hypertension    Mixed hyperlipidemia    Obesity    Post concussion syndrome    processes information slowly,    Postmenopausal    Prediabetes     Past Surgical History:  Procedure Laterality Date   ABDOMINAL HYSTERECTOMY     ANTERIOR CERVICAL DECOMP/DISCECTOMY FUSION N/A 04/23/2021   Procedure: Anterior Cervical Discectomy Fusion - Cervical Four- Cervical Five - Cervical Five -Cervical Six;  Surgeon: Kary Kos, MD;  Location: [place];  Service: Neurosurgery;  Laterality: N/A;  Anterior Cervical Discectomy Fusion - Cervical Four- Cervical Five - Cervical Five -Cervical Six   Bone Spur Left    Foot   BREAST BIOPSY     BREAST ENHANCEMENT SURGERY     BREAST IMPLANT EXCHANGE     replaced rupture implant   BREAST LUMPECTOMY WITH RADIOACTIVE SEED  LOCALIZATION Right 12/11/2020   Procedure: RIGHT BREAST LUMPECTOMY WITH RADIOACTIVE SEED LOCALIZATION;  Surgeon: Jovita Kussmaul, MD;  Location: [place];  Service: General;  Laterality: Right;   CARDIAC CATHETERIZATION     COLONOSCOPY     CORONARY ARTERY BYPASS GRAFT     JOINT REPLACEMENT     right total knee replacement   Left Bunionectomy     LEFT HEART CATHETERIZATION WITH CORONARY/GRAFT ANGIOGRAM  02/05/2012   Procedure: LEFT HEART CATHETERIZATION WITH Beatrix Fetters;  Surgeon: Candee Furbish, MD;  Location: [facility] CATH LAB;  Service: Cardiovascular;;   MASTECTOMY W/ SENTINEL NODE BIOPSY Right 02/19/2021   Procedure: RIGHT MASTECTOMY;  Surgeon: Jovita Kussmaul, MD;  Location: [place];  Service: General;  Laterality: Right;  RNFA, PEC BLOCK   PARTIAL HYSTERECTOMY     RE-EXCISION OF BREAST CANCER,SUPERIOR MARGINS Right 01/15/2021   Procedure: RE-EXCISION RIGHT BREAST MEDIAL MARGIN;  Surgeon: Jovita Kussmaul, MD;  Location: [place];  Service: General;  Laterality: Right;   REPLACEMENT TOTAL KNEE Right    SENTINEL NODE BIOPSY Right 02/19/2021   Procedure: SENTINEL LYMPH NODE BIOPSY;  Surgeon: Jovita Kussmaul, MD;  Location: [place];  Service: General;  Laterality: Right;   TONSILLECTOMY     TUBAL LIGATION  Bilateral     There were no vitals filed for this visit.   Subjective Assessment - 07/24/21 1404     Subjective Went to the dentist earlier today, has been walking a lot more.    Pertinent History CAD s/p CABG in 2012, breast CA with R mastectomy 2022, HTN, HLD, L bunionectomy, post-concussion syndrome, R TKA    Diagnostic tests none recent    Patient Stated Goals improve balance    Currently in Pain? No/denies                               [facility] Adult PT Treatment/Exercise - 07/24/21 0001       Ambulation/Gait   Ambulation/Gait Yes    Ambulation Distance (Feet) 170 Feet    Assistive device Rolling walker    Gait Pattern Step-to  pattern;Step-through pattern;Decreased step length - right;Decreased step length - left;Decreased stride length;Decreased dorsiflexion - right;Decreased dorsiflexion - left;Trunk flexed    Stairs Yes    Number of Stairs 4    Height of Stairs 8      Exercises   Exercises Knee/Hip;Ankle      Neck Exercises: Machines for Strengthening   UBE (Upper Arm Bike) L1.0 x 3 min forward/3 min back      Lumbar Exercises: Seated   Other Seated Lumbar Exercises shoulder extensions and rows 2x10 reps with red TB      Knee/Hip Exercises: Standing   Lateral Step Up Left;10 reps;Hand Hold: 2;Step Height: 6"    Forward Step Up Both;2 sets;10 reps;Hand Hold: 2;Step Height: 6"                      PT Short Term Goals - 06/25/21 1449       PT SHORT TERM GOAL #1   Title Patient to be independent with initial HEP.    Time 3    Period Weeks    Status Achieved    Target Date 07/15/21               PT Long Term Goals - 07/24/21 1506       PT LONG TERM GOAL #1   Title Patient to be independent with advanced HEP.    Time 6    Period Weeks    Status Partially Met   met for inital     PT LONG TERM GOAL #2   Title Patient to demonstrate B UE and LE strength >/=4+/5.    Time 6    Period Weeks    Status On-going   LE strength still limited but improving     PT LONG TERM GOAL #3   Title Patient to demonstrate cervical AROM WFL and without pain limiting.    Time 6    Period Weeks    Status On-going      PT LONG TERM GOAL #4   Title Patient to report 70% improvement in LBP.    Time 6    Period Weeks    Status On-going                   Plan - 07/24/21 1444     Clinical Impression Statement Pt demonstrates decreased B foot clearance and flexed trunk during gait. Cues given to keep close proximity with AD during gait as well for safety. Incorporated stair training but limited to 4 steps due to her reported fear of glass window. Also did  steps up to work on functional  strengthening, gave instruction for her to fully extend the hips at top. Cues also for full engagement of periscap muscles during scap stab.    Personal Factors and Comorbidities Age;Comorbidity 3+;Fitness;Past/Current Experience;Time since onset of injury/illness/exacerbation    Comorbidities CAD s/p CABG in 2012, breast CA with R mastectomy 2022, HTN, HLD, L bunionectomy, post-concussion syndrome, R TKA    PT Frequency 2x / week    PT Duration 6 weeks    PT Treatment/Interventions ADLs/Self Care Home Management;Cryotherapy;Electrical Stimulation;Iontophoresis 91m/ml Dexamethasone;Moist Heat;Balance training;Therapeutic exercise;Therapeutic activities;Functional mobility training;Stair training;Gait training;Ultrasound;Neuromuscular re-education;Patient/family education;Manual techniques;Taping;Energy conservation;Dry needling;Passive range of motion    PT Next Visit Plan continue to progress LE strengthening, balance, cervical ROM    PT Home Exercise Plan Access Code [email or web address]   Consulted and Agree with Plan of Care Patient             Patient will benefit from skilled therapeutic intervention in order to improve the following deficits and impairments:  Abnormal gait, Hypomobility, Decreased activity tolerance, Decreased strength, Increased fascial restricitons, Pain, Impaired UE functional use, Decreased balance, Decreased mobility, Difficulty walking, Increased muscle spasms, Improper body mechanics, Decreased range of motion, Postural dysfunction, Impaired flexibility  Visit Diagnosis: Other symptoms and signs involving the musculoskeletal system  Chronic bilateral low back pain without sciatica  Unsteadiness on feet  Cervicalgia     Problem List Patient Active Problem List   Diagnosis Date Noted   Paroxysmal atrial fibrillation ([place] 05/15/2021   Secondary hypercoagulable state ([place] 05/15/2021   [place]el[place] ([place] 04/23/2021   Genetic testing 12/04/2020   Family history of  breast cancer 11/18/2020   Erythrocytosis 11/14/2020   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 [place] [address]Phone: [phone]  Fax:  [phone] Name: [name] DelcidMRN: [id number] of Birth: 106-May-1947

## 2021-07-25 DIAGNOSIS — D0511 Intraductal carcinoma in situ of right breast: Secondary | ICD-10-CM | POA: Diagnosis not present

## 2021-07-28 ENCOUNTER — Encounter: Payer: Self-pay | Admitting: Physical Therapy

## 2021-07-28 ENCOUNTER — Ambulatory Visit: Payer: Medicare HMO | Admitting: Physical Therapy

## 2021-07-28 ENCOUNTER — Other Ambulatory Visit: Payer: Self-pay

## 2021-07-28 DIAGNOSIS — M545 Low back pain, unspecified: Secondary | ICD-10-CM | POA: Diagnosis not present

## 2021-07-28 DIAGNOSIS — R29898 Other symptoms and signs involving the musculoskeletal system: Secondary | ICD-10-CM | POA: Diagnosis not present

## 2021-07-28 DIAGNOSIS — G8929 Other chronic pain: Secondary | ICD-10-CM | POA: Diagnosis not present

## 2021-07-28 DIAGNOSIS — M25611 Stiffness of right shoulder, not elsewhere classified: Secondary | ICD-10-CM | POA: Diagnosis not present

## 2021-07-28 DIAGNOSIS — M542 Cervicalgia: Secondary | ICD-10-CM

## 2021-07-28 DIAGNOSIS — Z9011 Acquired absence of right breast and nipple: Secondary | ICD-10-CM | POA: Diagnosis not present

## 2021-07-28 DIAGNOSIS — R2681 Unsteadiness on feet: Secondary | ICD-10-CM

## 2021-07-28 NOTE — Therapy (Signed)
[facility] [address]  [place], [place], [address] Phone: [phone]   Fax:  [phone]  Physical Therapy Treatment  Progress Note Reporting Period 06/24/2021 to 07/28/2021  See note below for Objective Data and Assessment of Progress/Goals.     Patient Details  Name: [name] MRN: [id number] Date of Birth: 23-Jun-1946 Referring Provider (PT): Kary Kos, MD   Encounter Date: 07/28/2021   PT End of Session - 07/28/21 1322     Visit Number 10    Number of Visits 13    Date for PT Re-Evaluation 08/05/21    Authorization Type [employer] Medicare    Progress Note Due on Visit 10    PT Start Time 1318    PT Stop Time 1400    PT Time Calculation (min) 42 min    Activity Tolerance Patient tolerated treatment well    Behavior During Therapy [facility] for tasks assessed/performed             Past Medical History:  Diagnosis Date   Allergic rhinitis    Breast cancer ([place]) 10/17/2020   CAD (coronary artery disease)    , s/p CAGB 01/2011- LIMA to LAD patent, all other vein grafts occluded. Circumflex DES 3/13 placed following lateral ischemia on nuclear stress test   Cervical spondylosis    , bulging  discs   Disc herniation    L4-L5, L5-S1   Family history of breast cancer 11/18/2020   Hypertension    Mixed hyperlipidemia    Obesity    Post concussion syndrome    processes information slowly,    Postmenopausal    Prediabetes     Past Surgical History:  Procedure Laterality Date   ABDOMINAL HYSTERECTOMY     ANTERIOR CERVICAL DECOMP/DISCECTOMY FUSION N/A 04/23/2021   Procedure: Anterior Cervical Discectomy Fusion - Cervical Four- Cervical Five - Cervical Five -Cervical Six;  Surgeon: Kary Kos, MD;  Location: [place];  Service: Neurosurgery;  Laterality: N/A;  Anterior Cervical Discectomy Fusion - Cervical Four- Cervical Five - Cervical Five -Cervical Six   Bone Spur Left    Foot   BREAST BIOPSY      BREAST ENHANCEMENT SURGERY     BREAST IMPLANT EXCHANGE     replaced rupture implant   BREAST LUMPECTOMY WITH RADIOACTIVE SEED LOCALIZATION Right 12/11/2020   Procedure: RIGHT BREAST LUMPECTOMY WITH RADIOACTIVE SEED LOCALIZATION;  Surgeon: Jovita Kussmaul, MD;  Location: [place];  Service: General;  Laterality: Right;   CARDIAC CATHETERIZATION     COLONOSCOPY     CORONARY ARTERY BYPASS GRAFT     JOINT REPLACEMENT     right total knee replacement   Left Bunionectomy     LEFT HEART CATHETERIZATION WITH CORONARY/GRAFT ANGIOGRAM  02/05/2012   Procedure: LEFT HEART CATHETERIZATION WITH Beatrix Fetters;  Surgeon: Candee Furbish, MD;  Location: [facility] CATH LAB;  Service: Cardiovascular;;   MASTECTOMY W/ SENTINEL NODE BIOPSY Right 02/19/2021   Procedure: RIGHT MASTECTOMY;  Surgeon: Jovita Kussmaul, MD;  Location: [place];  Service: General;  Laterality: Right;  RNFA, [place]   PARTIAL HYSTERECTOMY     RE-EXCISION OF BREAST CANCER,SUPERIOR MARGINS Right 01/15/2021   Procedure: RE-EXCISION RIGHT BREAST MEDIAL MARGIN;  Surgeon: Jovita Kussmaul, MD;  Location: [place];  Service: General;  Laterality: Right;   REPLACEMENT TOTAL KNEE Right    SENTINEL NODE BIOPSY Right 02/19/2021   Procedure: SENTINEL LYMPH NODE BIOPSY;  Surgeon: Marlou Starks,  Sena Hitch, MD;  Location: [place];  Service: General;  Laterality: Right;   TONSILLECTOMY     TUBAL LIGATION Bilateral     There were no vitals filed for this visit.   Subjective Assessment - 07/28/21 1321     Subjective Has family visiting right now.  No new concerns or changes.    Pertinent History CAD s/p CABG in 2012, breast CA with R mastectomy 2022, HTN, HLD, L bunionectomy, post-concussion syndrome, R TKA    Diagnostic tests none recent    Patient Stated Goals improve balance    Currently in Pain? No/denies                [facility] PT Assessment - 07/28/21 0001       Assessment   Medical Diagnosis Disease of spinal cord     Referring Provider (PT) Kary Kos, MD    Onset Date/Surgical Date 04/23/21    Hand Dominance Right    Prior Therapy [facility]      ROM / Strength   AROM / PROM / Strength AROM;Strength      AROM   Cervical Flexion 30    Cervical Extension 40    Cervical - Right Rotation 50    Cervical - Left Rotation 45      Strength   Right Shoulder Flexion 4+/5    Right Shoulder ABduction 4+/5    Right Shoulder Internal Rotation 4+/5    Right Shoulder External Rotation 4+/5    Left Shoulder Flexion 4+/5    Left Shoulder ABduction 4+/5    Left Shoulder Internal Rotation 4+/5    Left Shoulder External Rotation 4+/5    Right Elbow Flexion 4+/5    Right Elbow Extension 4+/5    Left Elbow Flexion 4+/5    Left Elbow Extension 4+/5    Right Hip Flexion 4/5    Right Hip ABduction 4+/5    Right Hip ADduction 4+/5    Left Hip Flexion 4/5    Left Hip ABduction 4+/5    Left Hip ADduction 4+/5      Transfers   Five time sit to stand comments  47 sec with UE assist                           [facility] Adult PT Treatment/Exercise - 07/28/21 0001       Balance   Balance Assessed Yes      Berg Balance Test   Sit to Stand Able to stand  independently using hands    Standing Unsupported Able to stand safely 2 minutes    Sitting with Back Unsupported but Feet Supported on Floor or Stool Able to sit safely and securely 2 minutes    Stand to Sit Controls descent by using hands    Transfers Able to transfer safely, definite need of hands    Standing Unsupported with Eyes Closed Able to stand 10 seconds safely    Standing Ubsupported with Feet Together Able to place feet together independently and stand 1 minute safely    From Standing, Reach Forward with Outstretched Arm Can reach forward >12 cm safely (5")    From Standing Position, Pick up Object from Floor Able to pick up shoe, needs supervision    From Standing Position, Turn to Look Behind Over each Shoulder Needs supervision when turning     Turn 360 Degrees Able to turn 360 degrees safely but slowly    Standing Unsupported, Alternately  Place Feet on Step/Stool Able to complete >2 steps/needs minimal assist    Standing Unsupported, One Foot in [employer] balance while stepping or standing    Standing on One Leg Tries to lift leg/unable to hold 3 seconds but remains standing independently    Total Score 36      Exercises   Exercises Knee/Hip;Ankle      Neck Exercises: Seated   Other Seated Exercise upper trunk twist x 10      Lumbar Exercises: Aerobic   Stationary Bike x 5 min   R knee stiffness post TKA     Lumbar Exercises: Seated   Other Seated Lumbar Exercises sit to stands 2 x 10                    PT Education - 07/28/21 1406     Education Details education on progress, goals, plan of care, progression HEP including sit to stands, technique    Person(s) Educated Patient    Methods Explanation;Demonstration;Verbal cues;Handout    Comprehension Verbalized understanding;Returned demonstration              PT Short Term Goals - 06/25/21 1449       PT SHORT TERM GOAL #1   Title Patient to be independent with initial HEP.    Time 3    Period Weeks    Status Achieved    Target Date 07/15/21               PT Long Term Goals - 07/28/21 1323       PT LONG TERM GOAL #1   Title Patient to be independent with advanced HEP.    Time 6    Period Weeks    Status On-going   met for current HEP   Target Date 09/08/21      PT LONG TERM GOAL #2   Title Patient to demonstrate B UE and LE strength >/=4+/5.    Time 6    Period Weeks    Status On-going   hip flexor weakness 4/5   Target Date 09/08/21      PT LONG TERM GOAL #3   Title Patient to demonstrate cervical AROM WFL and without pain limiting.    Time 6    Period Weeks    Status On-going   still very limited, also lacking trunk rotation.  See flowsheet   Target Date 09/08/21      PT LONG TERM GOAL #4   Title Patient to report  70% improvement in LBP.    Time 6    Period Weeks    Status Achieved   no low back pain     PT LONG TERM GOAL #5   Title Pt will improve balance as demonstrated by 45/56 on berg to decrease fall risk    Baseline 36/56    Time 6    Period Weeks    Status New    Target Date 09/08/21      Additional Long Term Goals   Additional Long Term Goals Yes      PT LONG TERM GOAL #6   Title Pt. will demonstrate improved functional strength by complete 5x STS <40 seconds.    Baseline 47 sec with UE support    Time 6    Period Weeks    Status New    Target Date 09/08/21                   Plan -  07/28/21 1407     Clinical Impression Statement Patient is making progress towards goals, however is still very stiff and guarded with cervical ROM, noted today very limited in trunk rotation as well, added upper trunk rotation to HEP to encourage.  She also is very slow with getting up and down (5x STS was 47 seconds with UE assist) again limiting trunk movements, reviewed proper mechanics of standing, using table support to encourage leaning foward to unweight buttocks.  Her BERG score is 36/56 indicating high risk of falls.  Her strength is improving, and she reports no pain.  She would benefit from continuing skilled physical therapy additional 2x/week for 6 weeks due to her high risk of falls in order to address remaining deficits.    Personal Factors and Comorbidities Age;Comorbidity 3+;Fitness;Past/Current Experience;Time since onset of injury/illness/exacerbation    Comorbidities CAD s/p CABG in 2012, breast CA with R mastectomy 2022, HTN, HLD, L bunionectomy, post-concussion syndrome, R TKA    PT Frequency 2x / week    PT Duration 6 weeks    PT Treatment/Interventions ADLs/Self Care Home Management;Cryotherapy;Electrical Stimulation;Iontophoresis 22m/ml Dexamethasone;Moist Heat;Balance training;Therapeutic exercise;Therapeutic activities;Functional mobility training;Stair training;Gait  training;Ultrasound;Neuromuscular re-education;Patient/family education;Manual techniques;Taping;Energy conservation;Dry needling;Passive range of motion    PT Next Visit Plan incorporate more dynamic movement with trunk rotation, update FOTO    PT Home Exercise Plan Access Code: [email or web address]    Consulted and Agree with Plan of Care Patient             Patient will benefit from skilled therapeutic intervention in order to improve the following deficits and impairments:  Abnormal gait, Hypomobility, Decreased activity tolerance, Decreased strength, Increased fascial restricitons, Pain, Impaired UE functional use, Decreased balance, Decreased mobility, Difficulty walking, Increased muscle spasms, Improper body mechanics, Decreased range of motion, Postural dysfunction, Impaired flexibility  Visit Diagnosis: Other symptoms and signs involving the musculoskeletal system  Chronic bilateral low back pain without sciatica  Unsteadiness on feet  Cervicalgia  Status post mastectomy, right  Stiffness of right shoulder, not elsewhere classified     Problem List Patient Active Problem List   Diagnosis Date Noted   Paroxysmal atrial fibrillation ([facility] [place] 05/15/2021   Secondary hypercoagulable state ([facility] 05/15/2021   Myelopathy ([facility] 04/23/2021   Genetic testing 12/04/2020   Family history of breast cancer 11/18/2020   Erythrocytosis 11/14/2020   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 [place] [address]Phone: [phone]  Fax:  [phone] Name: BTeah VotawMRN: [id number] of Birth: [birth year]

## 2021-07-28 NOTE — Patient Instructions (Signed)
Access Code: [email or web address] URL: [email or web address] Date: 07/28/2021 Prepared by: Glenetta Hew  Exercises Sit to Stand with Counter Support - 3 x daily - 7 x weekly - 2 sets - 10 reps Seated Trunk Rotation - Arms Crossed - 1 x daily - 7 x weekly - 2 sets - 10 reps

## 2021-07-28 NOTE — Addendum Note (Signed)
Addended by: Rennie Natter on: 07/28/2021 02:19 PM   Modules accepted: Orders

## 2021-07-31 ENCOUNTER — Other Ambulatory Visit: Payer: Self-pay

## 2021-07-31 ENCOUNTER — Ambulatory Visit: Payer: Medicare HMO

## 2021-07-31 DIAGNOSIS — G8929 Other chronic pain: Secondary | ICD-10-CM

## 2021-07-31 DIAGNOSIS — M542 Cervicalgia: Secondary | ICD-10-CM | POA: Diagnosis not present

## 2021-07-31 DIAGNOSIS — R29898 Other symptoms and signs involving the musculoskeletal system: Secondary | ICD-10-CM | POA: Diagnosis not present

## 2021-07-31 DIAGNOSIS — M25611 Stiffness of right shoulder, not elsewhere classified: Secondary | ICD-10-CM | POA: Diagnosis not present

## 2021-07-31 DIAGNOSIS — M545 Low back pain, unspecified: Secondary | ICD-10-CM | POA: Diagnosis not present

## 2021-07-31 DIAGNOSIS — R2681 Unsteadiness on feet: Secondary | ICD-10-CM | POA: Diagnosis not present

## 2021-07-31 DIAGNOSIS — Z9011 Acquired absence of right breast and nipple: Secondary | ICD-10-CM | POA: Diagnosis not present

## 2021-07-31 NOTE — Therapy (Signed)
[facility] [address]  [place], [place], [address] Phone: [phone]   Fax:  [phone]  Physical Therapy Treatment  Patient Details  Name: [name] MRN: [id number] Date of Birth: 1946/08/05 Referring Provider (PT): Kary Kos, MD   Encounter Date: 07/31/2021   PT End of Session - 07/31/21 1459     Visit Number 11    Number of Visits 23    Date for PT Re-Evaluation 09/08/21    Authorization Type A[employer]na Medicare    Progress Note Due on Visit 103    PT Start Time 1402    PT Stop Time 1450    PT Time Calculation (min) 48 min    Activity Tolerance Patient tolerated treatment well    Behavior During Therapy [facility] for tasks assessed/performed             Past Medical History:  Diagnosis Date   Allergic rhinitis    Breast cancer ([place]) 10/17/2020   CAD (coronary artery disease)    , s/p CAGB 01/2011- LIMA to LAD patent, all other vein grafts occluded. Circumflex DES 3/13 placed following lateral ischemia on nuclear stress test   Cervical spondylosis    , bulging  discs   Disc herniation    L4-L5, L5-S1   Family history of breast cancer 11/18/2020   Hypertension    Mixed hyperlipidemia    Obesity    Post concussion syndrome    processes information slowly,    Postmenopausal    Prediabetes     Past Surgical History:  Procedure Laterality Date   ABDOMINAL HYSTERECTOMY     ANTERIOR CERVICAL DECOMP/DISCECTOMY FUSION N/A 04/23/2021   Procedure: Anterior Cervical Discectomy Fusion - Cervical Four- Cervical Five - Cervical Five -Cervical Six;  Surgeon: Kary Kos, MD;  Location: [place];  Service: Neurosurgery;  Laterality: N/A;  Anterior Cervical Discectomy Fusion - Cervical Four- Cervical Five - Cervical Five -Cervical Six   Bone Spur Left    Foot   BREAST BIOPSY     BREAST ENHANCEMENT SURGERY     BREAST IMPLANT EXCHANGE     replaced rupture implant   BREAST LUMPECTOMY WITH RADIOACTIVE SEED  LOCALIZATION Right 12/11/2020   Procedure: RIGHT BREAST LUMPECTOMY WITH RADIOACTIVE SEED LOCALIZATION;  Surgeon: Jovita Kussmaul, MD;  Location: [place];  Service: General;  Laterality: Right;   CARDIAC CATHETERIZATION     COLONOSCOPY     CORONARY ARTERY BYPASS GRAFT     JOINT REPLACEMENT     right total knee replacement   Left Bunionectomy     LEFT HEART CATHETERIZATION WITH CORONARY/GRAFT ANGIOGRAM  02/05/2012   Procedure: LEFT HEART CATHETERIZATION WITH Beatrix Fetters;  Surgeon: Candee Furbish, MD;  Location: [facility] CATH LAB;  Service: Cardiovascular;;   MASTECTOMY W/ SENTINEL NODE BIOPSY Right 02/19/2021   Procedure: RIGHT MASTECTOMY;  Surgeon: Jovita Kussmaul, MD;  Location: [place];  Service: General;  Laterality: Right;  RNFA, PEC BLOCK   PARTIAL HYSTERECTOMY     RE-EXCISION OF BREAST CANCER,SUPERIOR MARGINS Right 01/15/2021   Procedure: RE-EXCISION RIGHT BREAST MEDIAL MARGIN;  Surgeon: Jovita Kussmaul, MD;  Location: [place];  Service: General;  Laterality: Right;   REPLACEMENT TOTAL KNEE Right    SENTINEL NODE BIOPSY Right 02/19/2021   Procedure: SENTINEL LYMPH NODE BIOPSY;  Surgeon: Jovita Kussmaul, MD;  Location: [place];  Service: General;  Laterality: Right;   TONSILLECTOMY     TUBAL LIGATION  Bilateral     There were no vitals filed for this visit.   Subjective Assessment - 07/31/21 1406     Subjective Having trouble doing STS with counter support, chairs are too low.    Pertinent History CAD s/p CABG in 2012, breast CA with R mastectomy 2022, HTN, HLD, L bunionectomy, post-concussion syndrome, R TKA    Diagnostic tests none recent    Patient Stated Goals improve balance    Currently in Pain? No/denies                               [facility] Adult PT Treatment/Exercise - 07/31/21 0001       Exercises   Exercises Knee/Hip;Ankle      Lumbar Exercises: Aerobic   Nustep L6 x 7 min      Lumbar Exercises: Seated   Other Seated  Lumbar Exercises B trunk rotations and extension 10 reps each      Knee/Hip Exercises: Standing   Knee Flexion Strengthening;Both;15 reps    Knee Flexion Limitations 2# with counter support    Hip Abduction Stengthening;Both;10 reps;Knee straight    Abduction Limitations 2#; circles starting in ABD to ext    Functional Squat 10 reps    Functional Squat Limitations counter support    Other Standing Knee Exercises marches with counter support 15 reps; 2# weights      Knee/Hip Exercises: Seated   Marching Strengthening;Both;2 sets;10 reps    Marching Limitations red TB                    PT Education - 07/31/21 1502     Education Details HEP update    Person(s) Educated Patient    Methods Explanation;Demonstration;Verbal cues;Handout    Comprehension Verbalized understanding;Returned demonstration              PT Short Term Goals - 06/25/21 1449       PT SHORT TERM GOAL #1   Title Patient to be independent with initial HEP.    Time 3    Period Weeks    Status Achieved    Target Date 07/15/21               PT Long Term Goals - 07/28/21 1323       PT LONG TERM GOAL #1   Title Patient to be independent with advanced HEP.    Time 6    Period Weeks    Status On-going   met for current HEP   Target Date 09/08/21      PT LONG TERM GOAL #2   Title Patient to demonstrate B UE and LE strength >/=4+/5.    Time 6    Period Weeks    Status On-going   hip flexor weakness 4/5   Target Date 09/08/21      PT LONG TERM GOAL #3   Title Patient to demonstrate cervical AROM WFL and without pain limiting.    Time 6    Period Weeks    Status On-going   still very limited, also lacking trunk rotation.  See flowsheet   Target Date 09/08/21      PT LONG TERM GOAL #4   Title Patient to report 70% improvement in LBP.    Time 6    Period Weeks    Status Achieved   no low back pain     PT LONG TERM GOAL #5   Title  Pt will improve balance as demonstrated by 45/56  on berg to decrease fall risk    Baseline 36/56    Time 6    Period Weeks    Status New    Target Date 09/08/21      Additional Long Term Goals   Additional Long Term Goals Yes      PT LONG TERM GOAL #6   Title Pt. will demonstrate improved functional strength by complete 5x STS <40 seconds.    Baseline 47 sec with UE support    Time 6    Period Weeks    Status New    Target Date 09/08/21                   Plan - 07/31/21 1503     Clinical Impression Statement Pt reportedly has been having trouble with completing STS with counter support. She noted that her chairs at home are too low and demonstrates difficulty utlizing the counter support to assist with STS. After trying different modifications and he still finding difficulty we substituted squats with counter support. Continued with hip strengthening exercises to improve stability in WB positions. Fatigue noted with hs curls but otherwise good demonstration. Updated HEP and reviewed it with her to ensure proper understanding. Pt responded well.    Personal Factors and Comorbidities Age;Comorbidity 3+;Fitness;Past/Current Experience;Time since onset of injury/illness/exacerbation    Comorbidities CAD s/p CABG in 2012, breast CA with R mastectomy 2022, HTN, HLD, L bunionectomy, post-concussion syndrome, R TKA    PT Frequency 2x / week    PT Duration 6 weeks    PT Treatment/Interventions ADLs/Self Care Home Management;Cryotherapy;Electrical Stimulation;Iontophoresis 76m/ml Dexamethasone;Moist Heat;Balance training;Therapeutic exercise;Therapeutic activities;Functional mobility training;Stair training;Gait training;Ultrasound;Neuromuscular re-education;Patient/family education;Manual techniques;Taping;Energy conservation;Dry needling;Passive range of motion    PT Next Visit Plan incorporate more dynamic movement with trunk rotation, update FOTO    PT Home Exercise Plan Access Code: [email or web address]    Consulted and Agree with Plan of  Care Patient             Patient will benefit from skilled therapeutic intervention in order to improve the following deficits and impairments:  Abnormal gait, Hypomobility, Decreased activity tolerance, Decreased strength, Increased fascial restricitons, Pain, Impaired UE functional use, Decreased balance, Decreased mobility, Difficulty walking, Increased muscle spasms, Improper body mechanics, Decreased range of motion, Postural dysfunction, Impaired flexibility  Visit Diagnosis: Other symptoms and signs involving the musculoskeletal system  Chronic bilateral low back pain without sciatica  Unsteadiness on feet  Cervicalgia     Problem List Patient Active Problem List   Diagnosis Date Noted   Paroxysmal atrial fibrillation ([facility]) 05/15/2021   Secondary hypercoagulable state ([place] 05/15/2021   Myelopathy ([place] 04/23/2021   Genetic testing 12/04/2020   Family history of breast cancer 11/18/2020   Erythrocytosis 11/14/2020   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

## 2021-08-04 ENCOUNTER — Other Ambulatory Visit: Payer: Self-pay

## 2021-08-04 ENCOUNTER — Ambulatory Visit: Payer: Medicare HMO

## 2021-08-04 DIAGNOSIS — M545 Low back pain, unspecified: Secondary | ICD-10-CM | POA: Diagnosis not present

## 2021-08-04 DIAGNOSIS — R29898 Other symptoms and signs involving the musculoskeletal system: Secondary | ICD-10-CM | POA: Diagnosis not present

## 2021-08-04 DIAGNOSIS — G8929 Other chronic pain: Secondary | ICD-10-CM

## 2021-08-04 DIAGNOSIS — M542 Cervicalgia: Secondary | ICD-10-CM

## 2021-08-04 DIAGNOSIS — R2681 Unsteadiness on feet: Secondary | ICD-10-CM | POA: Diagnosis not present

## 2021-08-04 DIAGNOSIS — M25611 Stiffness of right shoulder, not elsewhere classified: Secondary | ICD-10-CM | POA: Diagnosis not present

## 2021-08-04 DIAGNOSIS — Z9011 Acquired absence of right breast and nipple: Secondary | ICD-10-CM | POA: Diagnosis not present

## 2021-08-04 NOTE — Therapy (Signed)
[facility] [address]  [place], [place], [address] Phone: [phone]   Fax:  [phone]  Physical Therapy Treatment  Patient Details  Name: [name] MRN: [id number] Date of Birth: Jul 07, 1946 Referring Provider (PT): Kary Kos, MD   Encounter Date: 08/04/2021   PT End of Session - 08/04/21 1446     Visit Number 12    Number of Visits 23    Date for PT Re-Evaluation 09/08/21    Authorization Type [employer]    Progress Note Due on Visit 20    PT Start Time 1403    PT Stop Time 1443    PT Time Calculation (min) 40 min    Activity Tolerance Patient tolerated treatment well    Behavior During Therapy [facility] for tasks assessed/performed             Past Medical History:  Diagnosis Date   Allergic rhinitis    Breast cancer ([place]) 10/17/2020   CAD (coronary artery disease)    , s/p CAGB 01/2011- LIMA to LAD patent, all other vein grafts occluded. Circumflex DES 3/13 placed following lateral ischemia on nuclear stress test   Cervical spondylosis    , bulging  discs   Disc herniation    L4-L5, L5-S1   Family history of breast cancer 11/18/2020   Hypertension    Mixed hyperlipidemia    Obesity    Post concussion syndrome    processes information slowly,    Postmenopausal    Prediabetes     Past Surgical History:  Procedure Laterality Date   ABDOMINAL HYSTERECTOMY     ANTERIOR CERVICAL DECOMP/DISCECTOMY FUSION N/A 04/23/2021   Procedure: Anterior Cervical Discectomy Fusion - Cervical Four- Cervical Five - Cervical Five -Cervical Six;  Surgeon: Kary Kos, MD;  Location: [place];  Service: Neurosurgery;  Laterality: N/A;  Anterior Cervical Discectomy Fusion - Cervical Four- Cervical Five - Cervical Five -Cervical Six   Bone Spur Left    Foot   BREAST BIOPSY     BREAST ENHANCEMENT SURGERY     BREAST IMPLANT EXCHANGE     replaced rupture implant   BREAST LUMPECTOMY WITH RADIOACTIVE SEED  LOCALIZATION Right 12/11/2020   Procedure: RIGHT BREAST LUMPECTOMY WITH RADIOACTIVE SEED LOCALIZATION;  Surgeon: Jovita Kussmaul, MD;  Location: [place];  Service: General;  Laterality: Right;   CARDIAC CATHETERIZATION     COLONOSCOPY     CORONARY ARTERY BYPASS GRAFT     JOINT REPLACEMENT     right total knee replacement   Left Bunionectomy     LEFT HEART CATHETERIZATION WITH CORONARY/GRAFT ANGIOGRAM  02/05/2012   Procedure: LEFT HEART CATHETERIZATION WITH Beatrix Fetters;  Surgeon: Candee Furbish, MD;  Location: [facility] CATH LAB;  Service: Cardiovascular;;   MASTECTOMY W/ SENTINEL NODE BIOPSY Right 02/19/2021   Procedure: RIGHT MASTECTOMY;  Surgeon: Jovita Kussmaul, MD;  Location: [place];  Service: General;  Laterality: Right;  RNFA, PEC BLOCK   PARTIAL HYSTERECTOMY     RE-EXCISION OF BREAST CANCER,SUPERIOR MARGINS Right 01/15/2021   Procedure: RE-EXCISION RIGHT BREAST MEDIAL MARGIN;  Surgeon: Jovita Kussmaul, MD;  Location: [place];  Service: General;  Laterality: Right;   REPLACEMENT TOTAL KNEE Right    SENTINEL NODE BIOPSY Right 02/19/2021   Procedure: SENTINEL LYMPH NODE BIOPSY;  Surgeon: Jovita Kussmaul, MD;  Location: [place];  Service: General;  Laterality: Right;   TONSILLECTOMY     TUBAL LIGATION  Bilateral     There were no vitals filed for this visit.   Subjective Assessment - 08/04/21 1407     Subjective Feels like she pulled a muscle in her neck from rotating her head to the L over the weekend.    Pertinent History CAD s/p CABG in 2012, breast CA with R mastectomy 2022, HTN, HLD, L bunionectomy, post-concussion syndrome, R TKA    Diagnostic tests none recent    Patient Stated Goals improve balance    Currently in Pain? No/denies                               [facility] Adult PT Treatment/Exercise - 08/04/21 0001       Neck Exercises: Theraband   Shoulder Extension 10 reps;Red    Shoulder Extension Limitations seated    Rows 10  reps;Red    Rows Limitations seated    Horizontal ABduction 10 reps;Red    Horizontal ABduction Limitations seated      Neck Exercises: Seated   Cervical Rotation Both;10 reps    Cervical Rotation Limitations cue to avoid pain      Neck Exercises: Stretches   Upper Trapezius Stretch Right;Left;2 reps;30 seconds      Manual Therapy   Manual Therapy Soft tissue mobilization;Myofascial release    Manual therapy comments seated    Soft tissue mobilization STM to B UT, LS, cervical paraspinals    Myofascial Release manual TPR to B UT, LS                      PT Short Term Goals - 06/25/21 1449       PT SHORT TERM GOAL #1   Title Patient to be independent with initial HEP.    Time 3    Period Weeks    Status Achieved    Target Date 07/15/21               PT Long Term Goals - 07/28/21 1323       PT LONG TERM GOAL #1   Title Patient to be independent with advanced HEP.    Time 6    Period Weeks    Status On-going   met for current HEP   Target Date 09/08/21      PT LONG TERM GOAL #2   Title Patient to demonstrate B UE and LE strength >/=4+/5.    Time 6    Period Weeks    Status On-going   hip flexor weakness 4/5   Target Date 09/08/21      PT LONG TERM GOAL #3   Title Patient to demonstrate cervical AROM WFL and without pain limiting.    Time 6    Period Weeks    Status On-going   still very limited, also lacking trunk rotation.  See flowsheet   Target Date 09/08/21      PT LONG TERM GOAL #4   Title Patient to report 70% improvement in LBP.    Time 6    Period Weeks    Status Achieved   no low back pain     PT LONG TERM GOAL #5   Title Pt will improve balance as demonstrated by 45/56 on berg to decrease fall risk    Baseline 36/56    Time 6    Period Weeks    Status New    Target Date 09/08/21  Additional Long Term Goals   Additional Long Term Goals Yes      PT LONG TERM GOAL #6   Title Pt. will demonstrate improved functional  strength by complete 5x STS <40 seconds.    Baseline 47 sec with UE support    Time 6    Period Weeks    Status New    Target Date 09/08/21                   Plan - 08/04/21 1451     Clinical Impression Statement Pt reported that she feels that she pulled a muscle in her R UT area over the weekend. Started with STM and noted many tender points more so on the R side. She would benefit form more work on STM to loosen up the musculature in the upper neck and shoulder area. Followed up with postural strengthening and she demonstrated good posterior shoulder engagement with these exercises. She responded well to treatment today and she would benefit from more work on STM and stretches to relieve tightness in the neck.    Personal Factors and Comorbidities Age;Comorbidity 3+;Fitness;Past/Current Experience;Time since onset of injury/illness/exacerbation    Comorbidities CAD s/p CABG in 2012, breast CA with R mastectomy 2022, HTN, HLD, L bunionectomy, post-concussion syndrome, R TKA    PT Frequency 2x / week    PT Duration 6 weeks    PT Treatment/Interventions ADLs/Self Care Home Management;Cryotherapy;Electrical Stimulation;Iontophoresis 36m/ml Dexamethasone;Moist Heat;Balance training;Therapeutic exercise;Therapeutic activities;Functional mobility training;Stair training;Gait training;Ultrasound;Neuromuscular re-education;Patient/family education;Manual techniques;Taping;Energy conservation;Dry needling;Passive range of motion    PT Next Visit Plan STM for the neck musculature; incorporate more dynamic movement with trunk rotation, update FOTO    PT Home Exercise Plan Access Code: [email or web address]    Consulted and Agree with Plan of Care Patient             Patient will benefit from skilled therapeutic intervention in order to improve the following deficits and impairments:  Abnormal gait, Hypomobility, Decreased activity tolerance, Decreased strength, Increased fascial restricitons, Pain,  Impaired UE functional use, Decreased balance, Decreased mobility, Difficulty walking, Increased muscle spasms, Improper body mechanics, Decreased range of motion, Postural dysfunction, Impaired flexibility  Visit Diagnosis: Other symptoms and signs involving the musculoskeletal system  Chronic bilateral low back pain without sciatica  Unsteadiness on feet  Cervicalgia     Problem List Patient Active Problem List   Diagnosis Date Noted   Paroxysmal atrial fibrillation ([place] 05/15/2021   Secondary hypercoagulable state ([place] 05/15/2021   Myelopathy ([place] 04/23/2021   Genetic testing 12/04/2020   Family history of breast cancer 11/18/2020   Erythrocytosis 11/14/2020   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

## 2021-08-05 ENCOUNTER — Ambulatory Visit: Payer: Medicare HMO | Admitting: Physical Therapy

## 2021-08-05 ENCOUNTER — Encounter: Payer: Self-pay | Admitting: Physical Therapy

## 2021-08-05 DIAGNOSIS — R29898 Other symptoms and signs involving the musculoskeletal system: Secondary | ICD-10-CM | POA: Diagnosis not present

## 2021-08-05 DIAGNOSIS — M545 Low back pain, unspecified: Secondary | ICD-10-CM | POA: Diagnosis not present

## 2021-08-05 DIAGNOSIS — R2681 Unsteadiness on feet: Secondary | ICD-10-CM | POA: Diagnosis not present

## 2021-08-05 DIAGNOSIS — M542 Cervicalgia: Secondary | ICD-10-CM | POA: Diagnosis not present

## 2021-08-05 DIAGNOSIS — G8929 Other chronic pain: Secondary | ICD-10-CM

## 2021-08-05 DIAGNOSIS — M25611 Stiffness of right shoulder, not elsewhere classified: Secondary | ICD-10-CM | POA: Diagnosis not present

## 2021-08-05 DIAGNOSIS — Z9011 Acquired absence of right breast and nipple: Secondary | ICD-10-CM | POA: Diagnosis not present

## 2021-08-05 NOTE — Therapy (Signed)
[facility] [address]  [place], [place], [address] Phone: [phone]   Fax:  [phone]  Physical Therapy Treatment  Patient Details  Name: [name] MRN: [id number] Date of Birth: 1946/01/17 Referring Provider (PT): Kary Kos, MD   Encounter Date: 08/05/2021   PT End of Session - 08/05/21 1313     Visit Number 13    Number of Visits 23    Date for PT Re-Evaluation 09/08/21    Authorization Type [employer]na Medicare    Progress Note Due on Visit 31    PT Start Time 1315    PT Stop Time 1400    PT Time Calculation (min) 45 min    Activity Tolerance Patient tolerated treatment well    Behavior During Therapy [facility] for tasks assessed/performed             Past Medical History:  Diagnosis Date   Allergic rhinitis    Breast cancer ([place]) 10/17/2020   CAD (coronary artery disease)    , s/p CAGB 01/2011- LIMA to LAD patent, all other vein grafts occluded. Circumflex DES 3/13 placed following lateral ischemia on nuclear stress test   Cervical spondylosis    , bulging  discs   Disc herniation    L4-L5, L5-S1   Family history of breast cancer 11/18/2020   Hypertension    Mixed hyperlipidemia    Obesity    Post concussion syndrome    processes information slowly,    Postmenopausal    Prediabetes     Past Surgical History:  Procedure Laterality Date   ABDOMINAL HYSTERECTOMY     ANTERIOR CERVICAL DECOMP/DISCECTOMY FUSION N/A 04/23/2021   Procedure: Anterior Cervical Discectomy Fusion - Cervical Four- Cervical Five - Cervical Five -Cervical Six;  Surgeon: Kary Kos, MD;  Location: [place];  Service: Neurosurgery;  Laterality: N/A;  Anterior Cervical Discectomy Fusion - Cervical Four- Cervical Five - Cervical Five -Cervical Six   Bone Spur Left    Foot   BREAST BIOPSY     BREAST ENHANCEMENT SURGERY     BREAST IMPLANT EXCHANGE     replaced rupture implant   BREAST LUMPECTOMY WITH RADIOACTIVE SEED  LOCALIZATION Right 12/11/2020   Procedure: RIGHT BREAST LUMPECTOMY WITH RADIOACTIVE SEED LOCALIZATION;  Surgeon: Jovita Kussmaul, MD;  Location: [place];  Service: General;  Laterality: Right;   CARDIAC CATHETERIZATION     COLONOSCOPY     CORONARY ARTERY BYPASS GRAFT     JOINT REPLACEMENT     right total knee replacement   Left Bunionectomy     LEFT HEART CATHETERIZATION WITH CORONARY/GRAFT ANGIOGRAM  02/05/2012   Procedure: LEFT HEART CATHETERIZATION WITH Beatrix Fetters;  Surgeon: Candee Furbish, MD;  Location: [facility] CATH LAB;  Service: Cardiovascular;;   MASTECTOMY W/ SENTINEL NODE BIOPSY Right 02/19/2021   Procedure: RIGHT MASTECTOMY;  Surgeon: Jovita Kussmaul, MD;  Location: [place];  Service: General;  Laterality: Right;  RNFA, PEC BLOCK   PARTIAL HYSTERECTOMY     RE-EXCISION OF BREAST CANCER,SUPERIOR MARGINS Right 01/15/2021   Procedure: RE-EXCISION RIGHT BREAST MEDIAL MARGIN;  Surgeon: Jovita Kussmaul, MD;  Location: [place];  Service: General;  Laterality: Right;   REPLACEMENT TOTAL KNEE Right    SENTINEL NODE BIOPSY Right 02/19/2021   Procedure: SENTINEL LYMPH NODE BIOPSY;  Surgeon: Jovita Kussmaul, MD;  Location: [place];  Service: General;  Laterality: Right;   TONSILLECTOMY     TUBAL LIGATION  Bilateral     There were no vitals filed for this visit.   Subjective Assessment - 08/05/21 1318     Subjective Pt. reports she feels like she is getting stronger.  She's been putting biofreeze on her R shoulder and it is feeling better.    Pertinent History CAD s/p CABG in 2012, breast CA with R mastectomy 2022, HTN, HLD, L bunionectomy, post-concussion syndrome, R TKA    Diagnostic tests none recent    Patient Stated Goals improve balance    Currently in Pain? No/denies                               [facility] Adult PT Treatment/Exercise - 08/05/21 0001       Exercises   Exercises Knee/Hip      Neck Exercises: Machines for Strengthening    Nustep L6 x 6 min      Neck Exercises: Theraband   Shoulder Extension Red;20 reps    Shoulder Extension Limitations standing for postural control, SBA for safety    Rows 20 reps;Red    Rows Limitations standing for postural control, SBA for safety    Shoulder External Rotation 20 reps;Red    Shoulder External Rotation Limitations standing for postural control, SBA for safety      Lumbar Exercises: Standing   Heel Raises 20 reps      Knee/Hip Exercises: Standing   Hip Abduction Stengthening;Both;Knee straight;20 reps    Abduction Limitations 2#, counter support    Hip Extension Stengthening;Both;10 reps    Extension Limitations 2lb ankles weights, counter support    Functional Squat 10 reps      Knee/Hip Exercises: Sidelying   Clams x 10 bil    Other Sidelying Knee/Hip Exercises open book x 10 bil                    PT Education - 08/05/21 1404     Education Details HEP update for clamshells and open book exercises.    Person(s) Educated Patient    Methods Explanation;Demonstration;Handout;Verbal cues    Comprehension Verbalized understanding;Returned demonstration              PT Short Term Goals - 06/25/21 1449       PT SHORT TERM GOAL #1   Title Patient to be independent with initial HEP.    Time 3    Period Weeks    Status Achieved    Target Date 07/15/21               PT Long Term Goals - 07/28/21 1323       PT LONG TERM GOAL #1   Title Patient to be independent with advanced HEP.    Time 6    Period Weeks    Status On-going   met for current HEP   Target Date 09/08/21      PT LONG TERM GOAL #2   Title Patient to demonstrate B UE and LE strength >/=4+/5.    Time 6    Period Weeks    Status On-going   hip flexor weakness 4/5   Target Date 09/08/21      PT LONG TERM GOAL #3   Title Patient to demonstrate cervical AROM WFL and without pain limiting.    Time 6    Period Weeks    Status On-going   still very limited, also  lacking trunk rotation.  See flowsheet  Target Date 09/08/21      PT LONG TERM GOAL #4   Title Patient to report 70% improvement in LBP.    Time 6    Period Weeks    Status Achieved   no low back pain     PT LONG TERM GOAL #5   Title Pt will improve balance as demonstrated by 45/56 on berg to decrease fall risk    Baseline 36/56    Time 6    Period Weeks    Status New    Target Date 09/08/21      Additional Long Term Goals   Additional Long Term Goals Yes      PT LONG TERM GOAL #6   Title Pt. will demonstrate improved functional strength by complete 5x STS <40 seconds.    Baseline 47 sec with UE support    Time 6    Period Weeks    Status New    Target Date 09/08/21                   Plan - 08/05/21 1314     Clinical Impression Statement Focus of todays interventions progressing standing exercises, for both LE and postural strengthening.  Some initial sway starting standing theraband exercises (close SBA for safety throughout) but improved steadiness as progressed.  She reported husband bought 2.5# ankle weights to use at home, based on ability with 2# weights today this should be manageable.  She tolerated sidelying exercises well but reported dizziness standing up.  Educated to perform open book to tolerance or keep arm bent due to pec tightness following BC surgery.  Pt. would benefit from continued skilled therapy.    Personal Factors and Comorbidities Age;Comorbidity 3+;Fitness;Past/Current Experience;Time since onset of injury/illness/exacerbation    Comorbidities CAD s/p CABG in 2012, breast CA with R mastectomy 2022, HTN, HLD, L bunionectomy, post-concussion syndrome, R TKA    PT Frequency 2x / week    PT Duration 6 weeks    PT Treatment/Interventions ADLs/Self Care Home Management;Cryotherapy;Electrical Stimulation;Iontophoresis 74m/ml Dexamethasone;Moist Heat;Balance training;Therapeutic exercise;Therapeutic activities;Functional mobility training;Stair  training;Gait training;Ultrasound;Neuromuscular re-education;Patient/family education;Manual techniques;Taping;Energy conservation;Dry needling;Passive range of motion    PT Next Visit Plan incorporate more dynamic movement with trunk rotation    PT Home Exercise Plan Access Code: [email or web address]    Consulted and Agree with Plan of Care Patient             Patient will benefit from skilled therapeutic intervention in order to improve the following deficits and impairments:  Abnormal gait, Hypomobility, Decreased activity tolerance, Decreased strength, Increased fascial restricitons, Pain, Impaired UE functional use, Decreased balance, Decreased mobility, Difficulty walking, Increased muscle spasms, Improper body mechanics, Decreased range of motion, Postural dysfunction, Impaired flexibility  Visit Diagnosis: Other symptoms and signs involving the musculoskeletal system  Chronic bilateral low back pain without sciatica  Unsteadiness on feet  Cervicalgia     Problem List Patient Active Problem List   Diagnosis Date Noted   Paroxysmal atrial fibrillation ([place] 05/15/2021   Secondary hypercoagulable state ([place] 05/15/2021   [place]elopathy ([place] 04/23/2021   Genetic testing 12/04/2020   Family history of breast cancer 11/18/2020   Erythrocytosis 11/14/2020   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  [place], [place], [address] Phone: [phone]   Fax:  [phone]  Name: [name] MRN: [id number] Date of Birth: 22-May-1946

## 2021-08-05 NOTE — Patient Instructions (Signed)
Access Code: [email or web address] URL: [email or web address] Date: 08/05/2021 Prepared by: Glenetta Hew  Exercises Clamshell - 1 x daily - 7 x weekly - 2 sets - 10 reps Sidelying Open Book - 1 x daily - 7 x weekly - 1 sets - 10 reps

## 2021-08-12 ENCOUNTER — Other Ambulatory Visit: Payer: Self-pay

## 2021-08-12 ENCOUNTER — Encounter: Payer: Self-pay | Admitting: Physical Therapy

## 2021-08-12 ENCOUNTER — Ambulatory Visit: Payer: Medicare HMO | Attending: Neurosurgery | Admitting: Physical Therapy

## 2021-08-12 DIAGNOSIS — R2681 Unsteadiness on feet: Secondary | ICD-10-CM | POA: Diagnosis not present

## 2021-08-12 DIAGNOSIS — R29898 Other symptoms and signs involving the musculoskeletal system: Secondary | ICD-10-CM | POA: Insufficient documentation

## 2021-08-12 DIAGNOSIS — M545 Low back pain, unspecified: Secondary | ICD-10-CM | POA: Insufficient documentation

## 2021-08-12 DIAGNOSIS — G8929 Other chronic pain: Secondary | ICD-10-CM | POA: Insufficient documentation

## 2021-08-12 DIAGNOSIS — M542 Cervicalgia: Secondary | ICD-10-CM | POA: Diagnosis not present

## 2021-08-12 NOTE — Therapy (Signed)
[facility] [address]  [place], [place], [address] Phone: [phone]   Fax:  [phone]  Physical Therapy Treatment  Patient Details  Name: [name] MRN: [id number] Date of Birth: 1946/05/15 Referring Provider (PT): Kary Kos, MD   Encounter Date: 08/12/2021   PT End of Session - 08/12/21 1820     Visit Number 14    Number of Visits 23    Date for PT Re-Evaluation 09/08/21    Authorization Type [employer]    Progress Note Due on Visit 38    PT Start Time 1315    PT Stop Time 1345    PT Time Calculation (min) 30 min    Activity Tolerance Patient tolerated treatment well    Behavior During Therapy [facility] for tasks assessed/performed             Past Medical History:  Diagnosis Date   Allergic rhinitis    Breast cancer ([place]) 10/17/2020   CAD (coronary artery disease)    , s/p CAGB 01/2011- LIMA to LAD patent, all other vein grafts occluded. Circumflex DES 3/13 placed following lateral ischemia on nuclear stress test   Cervical spondylosis    , bulging  discs   Disc herniation    L4-L5, L5-S1   Family history of breast cancer 11/18/2020   Hypertension    Mixed hyperlipidemia    Obesity    Post concussion syndrome    processes information slowly,    Postmenopausal    Prediabetes     Past Surgical History:  Procedure Laterality Date   ABDOMINAL HYSTERECTOMY     ANTERIOR CERVICAL DECOMP/DISCECTOMY FUSION N/A 04/23/2021   Procedure: Anterior Cervical Discectomy Fusion - Cervical Four- Cervical Five - Cervical Five -Cervical Six;  Surgeon: Kary Kos, MD;  Location: [place];  Service: Neurosurgery;  Laterality: N/A;  Anterior Cervical Discectomy Fusion - Cervical Four- Cervical Five - Cervical Five -Cervical Six   Bone Spur Left    Foot   BREAST BIOPSY     BREAST ENHANCEMENT SURGERY     BREAST IMPLANT EXCHANGE     replaced rupture implant   BREAST LUMPECTOMY WITH RADIOACTIVE SEED  LOCALIZATION Right 12/11/2020   Procedure: RIGHT BREAST LUMPECTOMY WITH RADIOACTIVE SEED LOCALIZATION;  Surgeon: Jovita Kussmaul, MD;  Location: [place];  Service: General;  Laterality: Right;   CARDIAC CATHETERIZATION     COLONOSCOPY     CORONARY ARTERY BYPASS GRAFT     JOINT REPLACEMENT     right total knee replacement   Left Bunionectomy     LEFT HEART CATHETERIZATION WITH CORONARY/GRAFT ANGIOGRAM  02/05/2012   Procedure: LEFT HEART CATHETERIZATION WITH Beatrix Fetters;  Surgeon: Candee Furbish, MD;  Location: [facility] C[facility] LAB;  Service: Cardiovascular;;   MASTECTOMY W/ SENTINEL NODE BIOPSY Right 02/19/2021   Procedure: RIGHT MASTECTOMY;  Surgeon: Jovita Kussmaul, MD;  Location: [place];  Service: General;  Laterality: Right;  RNFA, PEC BLOCK   PARTIAL HYSTERECTOMY     RE-EXCISION OF BREAST CANCER,SUPERIOR MARGINS Right 01/15/2021   Procedure: RE-EXCISION RIGHT BREAST MEDIAL MARGIN;  Surgeon: Jovita Kussmaul, MD;  Location: [place];  Service: General;  Laterality: Right;   REPLACEMENT TOTAL KNEE Right    SENTINEL NODE BIOPSY Right 02/19/2021   Procedure: SENTINEL LYMPH NODE BIOPSY;  Surgeon: Jovita Kussmaul, MD;  Location: [place];  Service: General;  Laterality: Right;   TONSILLECTOMY     TUBAL LIGATION  Bilateral     There were no vitals filed for this visit.   Subjective Assessment - 08/12/21 1315     Subjective Pt. reports she is doing well, no pain. Denies falls.    Pertinent History CAD s/p CABG in 2012, breast CA with R mastectomy 2022, HTN, HLD, L bunionectomy, post-concussion syndrome, R TKA    Diagnostic tests none recent    Patient Stated Goals improve balance    Currently in Pain? No/denies                               [facility] Adult PT Treatment/Exercise - 08/12/21 0001       Ambulation/Gait   Ambulation/Gait Yes    Ambulation Distance (Feet) 250 Feet    Assistive device Straight cane    Gait Pattern Step-through pattern    decreased trunk and cervical movement, very stiff in upper body     Neuro Re-ed    Neuro Re-ed Details  stepping strategy - foward with arm raises 2 x 10 bil, 1 UE support on counter, side steps 2 x 10 bil with decreasing UE support from 2 UE to 1 UE with second set, and backwards stepping with arm movements 2 x 10 bil with 1 UE support, CGA for safety.      Exercises   Exercises Knee/Hip      Neck Exercises: Machines for Strengthening   Nustep L6 x 6 min                    PT Short Term Goals - 06/25/21 1449       PT SHORT TERM GOAL #1   Title Patient to be independent with initial HEP.    Time 3    Period Weeks    Status Achieved    Target Date 07/15/21               PT Long Term Goals - 07/28/21 1323       PT LONG TERM GOAL #1   Title Patient to be independent with advanced HEP.    Time 6    Period Weeks    Status On-going   met for current HEP   Target Date 09/08/21      PT LONG TERM GOAL #2   Title Patient to demonstrate B UE and LE strength >/=4+/5.    Time 6    Period Weeks    Status On-going   hip flexor weakness 4/5   Target Date 09/08/21      PT LONG TERM GOAL #3   Title Patient to demonstrate cervical AROM WFL and without pain limiting.    Time 6    Period Weeks    Status On-going   still very limited, also lacking trunk rotation.  See flowsheet   Target Date 09/08/21      PT LONG TERM GOAL #4   Title Patient to report 70% improvement in LBP.    Time 6    Period Weeks    Status Achieved   no low back pain     PT LONG TERM GOAL #5   Title Pt will improve balance as demonstrated by 45/56 on berg to decrease fall risk    Baseline 36/56    Time 6    Period Weeks    Status New    Target Date 09/08/21      Additional Long Term Goals   Additional  Long Term Goals Yes      PT LONG TERM GOAL #6   Title Pt. will demonstrate improved functional strength by complete 5x STS <40 seconds.    Baseline 47 sec with UE support    Time 6     Period Weeks    Status New    Target Date 09/08/21                   Plan - 08/12/21 1820     Clinical Impression Statement Focus of today's interventions was working on stepping strategy, both for balance and increasing step length.  She required counter for support as well as CGA, primarily due to anxiety as no LOB observed today with activity.  cues for reaching with steps to improve trunk mobility.  Also progressed gait training to using cane today, very hesitant but no instability noted, but very stiff upperbody with gait.   Pt. would benefit from continued skilled therapy.    Personal Factors and Comorbidities Age;Comorbidity 3+;Fitness;Past/Current Experience;Time since onset of injury/illness/exacerbation    Comorbidities CAD s/p CABG in 2012, breast CA with R mastectomy 2022, HTN, HLD, L bunionectomy, post-concussion syndrome, R TKA    PT Frequency 2x / week    PT Duration 6 weeks    PT Treatment/Interventions ADLs/Self Care Home Management;Cryotherapy;Electrical Stimulation;Iontophoresis 81m/ml Dexamethasone;Moist Heat;Balance training;Therapeutic exercise;Therapeutic activities;Functional mobility training;Stair training;Gait training;Ultrasound;Neuromuscular re-education;Patient/family education;Manual techniques;Taping;Energy conservation;Dry needling;Passive range of motion    PT Next Visit Plan continue incorporate more dynamic movement with trunk rotation, gait training with cane    PT Home Exercise Plan Access Code: [email or web address]    Consulted and Agree with Plan of Care Patient             Patient will benefit from skilled therapeutic intervention in order to improve the following deficits and impairments:  Abnormal gait, Hypomobility, Decreased activity tolerance, Decreased strength, Increased fascial restricitons, Pain, Impaired UE functional use, Decreased balance, Decreased mobility, Difficulty walking, Increased muscle spasms, Improper body mechanics, Decreased range  of motion, Postural dysfunction, Impaired flexibility  Visit Diagnosis: Other symptoms and signs involving the musculoskeletal system  Chronic bilateral low back pain without sciatica  Unsteadiness on feet  Cervicalgia     Problem List Patient Active Problem List   Diagnosis Date Noted   Paroxysmal atrial fibrillation ([place] 05/15/2021   [place]coagulable state ([place] 05/15/2021   Myelopathy ([place] 04/23/2021   Genetic testing 12/04/2020   Family history of breast cancer 11/18/2020   Erythrocytosis 11/14/2020   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 [place] [address]Phone: [phone]  Fax:  [phone] Name: BCeyda PeterkaMRN: [id number] of Birth: [birth year]

## 2021-08-18 ENCOUNTER — Ambulatory Visit: Payer: Medicare HMO

## 2021-08-18 ENCOUNTER — Other Ambulatory Visit: Payer: Self-pay

## 2021-08-18 ENCOUNTER — Other Ambulatory Visit (HOSPITAL_BASED_OUTPATIENT_CLINIC_OR_DEPARTMENT_OTHER): Payer: Self-pay

## 2021-08-18 DIAGNOSIS — R29898 Other symptoms and signs involving the musculoskeletal system: Secondary | ICD-10-CM

## 2021-08-18 DIAGNOSIS — G8929 Other chronic pain: Secondary | ICD-10-CM | POA: Diagnosis not present

## 2021-08-18 DIAGNOSIS — R2681 Unsteadiness on feet: Secondary | ICD-10-CM

## 2021-08-18 DIAGNOSIS — M542 Cervicalgia: Secondary | ICD-10-CM

## 2021-08-18 DIAGNOSIS — M545 Low back pain, unspecified: Secondary | ICD-10-CM | POA: Diagnosis not present

## 2021-08-18 NOTE — Therapy (Signed)
[facility] [address]  [place], [place], [address] Phone: [phone]   Fax:  [phone]  Physical Therapy Treatment  Patient Details  Name: [name] MRN: [id number] Date of Birth: 1946-01-13 Referring Provider (PT): Kary Kos, MD   Encounter Date: 08/18/2021   PT End of Session - 08/18/21 1442     Visit Number 15    Number of Visits 23    Date for PT Re-Evaluation 09/08/21    Authorization Type [employer] Medicare    Progress Note Due on Visit 89    PT Start Time 1402    PT Stop Time 1441    PT Time Calculation (min) 39 min    Activity Tolerance Patient tolerated treatment well    Behavior During Therapy [facility] for tasks assessed/performed             Past Medical History:  Diagnosis Date   Allergic rhinitis    Breast cancer ([place]) 10/17/2020   CAD (coronary artery disease)    , s/p CAGB 01/2011- LIMA to LAD patent, all other vein grafts occluded. Circumflex DES 3/13 placed following lateral ischemia on nuclear stress test   Cervical spondylosis    , bulging  discs   Disc herniation    L4-L5, L5-S1   Family history of breast cancer 11/18/2020   Hypertension    Mixed hyperlipidemia    Obesity    Post concussion syndrome    processes information slowly,    Postmenopausal    Prediabetes     Past Surgical History:  Procedure Laterality Date   ABDOMINAL HYSTERECTOMY     ANTERIOR CERVICAL DECOMP/DISCECTOMY FUSION N/A 04/23/2021   Procedure: Anterior Cervical Discectomy Fusion - Cervical Four- Cervical Five - Cervical Five -Cervical Six;  Surgeon: Kary Kos, MD;  Location: [place];  Service: Neurosurgery;  Laterality: N/A;  Anterior Cervical Discectomy Fusion - Cervical Four- Cervical Five - Cervical Five -Cervical Six   Bone Spur Left    Foot   BREAST BIOPSY     BREAST ENHANCEMENT SURGERY     BREAST IMPLANT EXCHANGE     replaced rupture implant   BREAST LUMPECTOMY WITH RADIOACTIVE SEED  LOCALIZATION Right 12/11/2020   Procedure: RIGHT BREAST LUMPECTOMY WITH RADIOACTIVE SEED LOCALIZATION;  Surgeon: Jovita Kussmaul, MD;  Location: [place];  Service: General;  Laterality: Right;   CARDIAC CATHETERIZATION     COLONOSCOPY     CORONARY ARTERY BYPASS GRAFT     JOINT REPLACEMENT     right total knee replacement   Left Bunionectomy     LEFT HEART CATHETERIZATION WITH CORONARY/GRAFT ANGIOGRAM  02/05/2012   Procedure: LEFT HEART CATHETERIZATION WITH Beatrix Fetters;  Surgeon: Candee Furbish, MD;  Location: [facility] CATH LAB;  Service: Cardiovascular;;   MASTECTOMY W/ SENTINEL NODE BIOPSY Right 02/19/2021   Procedure: RIGHT MASTECTOMY;  Surgeon: Jovita Kussmaul, MD;  Location: [place];  Service: General;  Laterality: Right;  RNFA, PEC BLOCK   PARTIAL HYSTERECTOMY     RE-EXCISION OF BREAST CANCER,SUPERIOR MARGINS Right 01/15/2021   Procedure: RE-EXCISION RIGHT BREAST MEDIAL MARGIN;  Surgeon: Jovita Kussmaul, MD;  Location: [place];  Service: General;  Laterality: Right;   REPLACEMENT TOTAL KNEE Right    SENTINEL NODE BIOPSY Right 02/19/2021   Procedure: SENTINEL LYMPH NODE BIOPSY;  Surgeon: Jovita Kussmaul, MD;  Location: [place];  Service: General;  Laterality: Right;   TONSILLECTOMY     TUBAL LIGATION  Bilateral     There were no vitals filed for this visit.   Subjective Assessment - 08/18/21 1407     Subjective Pt reports no new complaints, doing well.    Pertinent History CAD s/p CABG in 2012, breast CA with R mastectomy 2022, HTN, HLD, L bunionectomy, post-concussion syndrome, R TKA    Diagnostic tests none recent    Patient Stated Goals improve balance                               [facility] Adult PT Treatment/Exercise - 08/18/21 0001       Exercises   Exercises Knee/Hip      Neck Exercises: Machines for Strengthening   Nustep L6 x 7 min      Neck Exercises: Standing   Upper Extremity D2 Flexion;20 reps    UE D2 Limitations  standing at counter B      Lumbar Exercises: Stretches   Standing Extension 10 reps;5 seconds    Standing Extension Limitations UE support at wall      Lumbar Exercises: Standing   Other Standing Lumbar Exercises B trunk rotations at counter 10 reps    Other Standing Lumbar Exercises B UE reaching to manual targets to incorporate more trunk movements and rotation 12 reps      Knee/Hip Exercises: Standing   Other Standing Knee Exercises retro steps at counter 10 reps with alt feet   cues to keep knees straight to facilitate quad contraction   Other Standing Knee Exercises church pews 2x10 reps with counter support                       PT Short Term Goals - 06/25/21 1449       PT SHORT TERM GOAL #1   Title Patient to be independent with initial HEP.    Time 3    Period Weeks    Status Achieved    Target Date 07/15/21               PT Long Term Goals - 07/28/21 1323       PT LONG TERM GOAL #1   Title Patient to be independent with advanced HEP.    Time 6    Period Weeks    Status On-going   met for current HEP   Target Date 09/08/21      PT LONG TERM GOAL #2   Title Patient to demonstrate B UE and LE strength >/=4+/5.    Time 6    Period Weeks    Status On-going   hip flexor weakness 4/5   Target Date 09/08/21      PT LONG TERM GOAL #3   Title Patient to demonstrate cervical AROM WFL and without pain limiting.    Time 6    Period Weeks    Status On-going   still very limited, also lacking trunk rotation.  See flowsheet   Target Date 09/08/21      PT LONG TERM GOAL #4   Title Patient to report 70% improvement in LBP.    Time 6    Period Weeks    Status Achieved   no low back pain     PT LONG TERM GOAL #5   Title Pt will improve balance as demonstrated by 45/56 on berg to decrease fall risk    Baseline 36/56    Time 6    Period  Weeks    Status New    Target Date 09/08/21      Additional Long Term Goals   Additional Long Term Goals Yes       PT LONG TERM GOAL #6   Title Pt. will demonstrate improved functional strength by complete 5x STS <40 seconds.    Baseline 47 sec with UE support    Time 6    Period Weeks    Status New    Target Date 09/08/21                   Plan - 08/18/21 1442     Clinical Impression Statement Pt still shows decreased trunk movement and arm swing during gait. Incorporated dynamic activites focusing on trunk rotation to allow for better mobility during gait. Cues required for proper WS and step strategy with the steeping exercises. Cues also given with the retro steps to keep TKE to emphasize quad contraction. Overall she responded well and would continue to benefit from exercises to improve trunk mobility and arm swing during gait.    Personal Factors and Comorbidities Age;Comorbidity 3+;Fitness;Past/Current Experience;Time since onset of injury/illness/exacerbation    Comorbidities CAD s/p CABG in 2012, breast CA with R mastectomy 2022, HTN, HLD, L bunionectomy, post-concussion syndrome, R TKA    PT Frequency 2x / week    PT Duration 6 weeks    PT Treatment/Interventions ADLs/Self Care Home Management;Cryotherapy;Electrical Stimulation;Iontophoresis 72m/ml Dexamethasone;Moist Heat;Balance training;Therapeutic exercise;Therapeutic activities;Functional mobility training;Stair training;Gait training;Ultrasound;Neuromuscular re-education;Patient/family education;Manual techniques;Taping;Energy conservation;Dry needling;Passive range of motion    PT Next Visit Plan continue incorporate more dynamic movement with trunk rotation, gait training with cane    PT Home Exercise Plan Access Code: [email or web address]    Consulted and Agree with Plan of Care Patient             Patient will benefit from skilled therapeutic intervention in order to improve the following deficits and impairments:  Abnormal gait, Hypomobility, Decreased activity tolerance, Decreased strength, Increased fascial restricitons,  Pain, Impaired UE functional use, Decreased balance, Decreased mobility, Difficulty walking, Increased muscle spasms, Improper body mechanics, Decreased range of motion, Postural dysfunction, Impaired flexibility  Visit Diagnosis: Other symptoms and signs involving the musculoskeletal system  Chronic bilateral low back pain without sciatica  Unsteadiness on feet  Cervicalgia     Problem List Patient Active Problem List   Diagnosis Date Noted   Paroxysmal atrial fibrillation ([place] 05/15/2021   Secondary hypercoagulable state ([place] 05/15/2021   Myelopathy ([place] 04/23/2021   Genetic testing 12/04/2020   Family history of breast cancer 11/18/2020   Erythrocytosis 11/14/2020   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

## 2021-08-21 ENCOUNTER — Other Ambulatory Visit: Payer: Self-pay

## 2021-08-21 ENCOUNTER — Ambulatory Visit: Payer: Medicare HMO

## 2021-08-21 DIAGNOSIS — G8929 Other chronic pain: Secondary | ICD-10-CM

## 2021-08-21 DIAGNOSIS — M542 Cervicalgia: Secondary | ICD-10-CM

## 2021-08-21 DIAGNOSIS — R29898 Other symptoms and signs involving the musculoskeletal system: Secondary | ICD-10-CM | POA: Diagnosis not present

## 2021-08-21 DIAGNOSIS — M545 Low back pain, unspecified: Secondary | ICD-10-CM | POA: Diagnosis not present

## 2021-08-21 DIAGNOSIS — R2681 Unsteadiness on feet: Secondary | ICD-10-CM | POA: Diagnosis not present

## 2021-08-21 NOTE — Therapy (Signed)
[facility] [address]  [place], [place], [address] Phone: [phone]   Fax:  [phone]  Physical Therapy Treatment  Patient Details  Name: [name] MRN: [id number] Date of Birth: 1946/07/16 Referring Provider (PT): Kary Kos, MD   Encounter Date: 08/21/2021   PT End of Session - 08/21/21 1446     Visit Number 16    Number of Visits 23    Date for PT Re-Evaluation 09/08/21    Authorization Type [employer]    Progress Note Due on Visit 46    PT Start Time 1403    PT Stop Time 1443    PT Time Calculation (min) 40 min    Activity Tolerance Patient tolerated treatment well    Behavior During Therapy [facility] for tasks assessed/performed             Past Medical History:  Diagnosis Date   Allergic rhinitis    Breast cancer ([place]) 10/17/2020   CAD (coronary artery disease)    , s/p CAGB 01/2011- LIMA to LAD patent, all other vein grafts occluded. Circumflex DES 3/13 placed following lateral ischemia on nuclear stress test   Cervical spondylosis    , bulging  discs   Disc herniation    L4-L5, L5-S1   Family history of breast cancer 11/18/2020   Hypertension    Mixed hyperlipidemia    Obesity    Post concussion syndrome    processes information slowly,    Postmenopausal    Prediabetes     Past Surgical History:  Procedure Laterality Date   ABDOMINAL HYSTERECTOMY     ANTERIOR CERVICAL DECOMP/DISCECTOMY FUSION N/A 04/23/2021   Procedure: Anterior Cervical Discectomy Fusion - Cervical Four- Cervical Five - Cervical Five -Cervical Six;  Surgeon: Kary Kos, MD;  Location: [place];  Service: Neurosurgery;  Laterality: N/A;  Anterior Cervical Discectomy Fusion - Cervical Four- Cervical Five - Cervical Five -Cervical Six   Bone Spur Left    Foot   BREAST BIOPSY     BREAST ENHANCEMENT SURGERY     BREAST IMPLANT EXCHANGE     replaced rupture implant   BREAST LUMPECTOMY WITH RADIOACTIVE SEED  LOCALIZATION Right 12/11/2020   Procedure: RIGHT BREAST LUMPECTOMY WITH RADIOACTIVE SEED LOCALIZATION;  Surgeon: Jovita Kussmaul, MD;  Location: [place];  Service: General;  Laterality: Right;   CARDIAC CATHETERIZATION     COLONOSCOPY     CORONARY ARTERY BYPASS GRAFT     JOINT REPLACEMENT     right total knee replacement   Left Bunionectomy     LEFT HEART CATHETERIZATION WITH CORONARY/GRAFT ANGIOGRAM  02/05/2012   Procedure: LEFT HEART CATHETERIZATION WITH Beatrix Fetters;  Surgeon: Candee Furbish, MD;  Location: [facility] CATH LAB;  Service: Cardiovascular;;   MASTECTOMY W/ SENTINEL NODE BIOPSY Right 02/19/2021   Procedure: RIGHT MASTECTOMY;  Surgeon: Jovita Kussmaul, MD;  Location: [place];  Service: General;  Laterality: Right;  RNFA, PEC BLOCK   PARTIAL HYSTERECTOMY     RE-EXCISION OF BREAST CANCER,SUPERIOR MARGINS Right 01/15/2021   Procedure: RE-EXCISION RIGHT BREAST MEDIAL MARGIN;  Surgeon: Jovita Kussmaul, MD;  Location: [place];  Service: General;  Laterality: Right;   REPLACEMENT TOTAL KNEE Right    SENTINEL NODE BIOPSY Right 02/19/2021   Procedure: SENTINEL LYMPH NODE BIOPSY;  Surgeon: Jovita Kussmaul, MD;  Location: [place];  Service: General;  Laterality: Right;   TONSILLECTOMY     TUBAL LIGATION  Bilateral     There were no vitals filed for this visit.   Subjective Assessment - 08/21/21 1406     Subjective Pt notes that she feels she is able to do more around the house, she only uses her walker at night.    Pertinent History CAD s/p CABG in 2012, breast CA with R mastectomy 2022, HTN, HLD, L bunionectomy, post-concussion syndrome, R TKA    Diagnostic tests none recent    Patient Stated Goals improve balance    Currently in Pain? No/denies                               [facility] Adult PT Treatment/Exercise - 08/21/21 0001       Ambulation/Gait   Ambulation Distance (Feet) 250 Feet    Assistive device Straight cane    Gait Pattern  Step-through pattern    Gait Comments decreased trunk movement, decreased arm swing, and decreased heel strike      Neck Exercises: Theraband   Horizontal ABduction 20 reps;Red    Horizontal ABduction Limitations seated      Lumbar Exercises: Aerobic   Nustep L6x107mn      Lumbar Exercises: Standing   Other Standing Lumbar Exercises B trunk rotations at counter 10 reps    Other Standing Lumbar Exercises reaching to manual targets to incorporate more trunk movements and rotation 15 reps      Knee/Hip Exercises: Standing   Hip Abduction Stengthening;Both;10 reps;Knee straight    Abduction Limitations red TB; counter support    Hip Extension Stengthening;Both;10 reps;Knee straight    Extension Limitations red TB; counter support    Other Standing Knee Exercises fwd gait with reciprocal arm raises 15 reps B                       PT Short Term Goals - 06/25/21 1449       PT SHORT TERM GOAL #1   Title Patient to be independent with initial HEP.    Time 3    Period Weeks    Status Achieved    Target Date 07/15/21               PT Long Term Goals - 07/28/21 1323       PT LONG TERM GOAL #1   Title Patient to be independent with advanced HEP.    Time 6    Period Weeks    Status On-going   met for current HEP   Target Date 09/08/21      PT LONG TERM GOAL #2   Title Patient to demonstrate B UE and LE strength >/=4+/5.    Time 6    Period Weeks    Status On-going   hip flexor weakness 4/5   Target Date 09/08/21      PT LONG TERM GOAL #3   Title Patient to demonstrate cervical AROM WFL and without pain limiting.    Time 6    Period Weeks    Status On-going   still very limited, also lacking trunk rotation.  See flowsheet   Target Date 09/08/21      PT LONG TERM GOAL #4   Title Patient to report 70% improvement in LBP.    Time 6    Period Weeks    Status Achieved   no low back pain     PT LONG TERM GOAL #5   Title Pt  will improve balance as  demonstrated by 45/56 on berg to decrease fall risk    Baseline 36/56    Time 6    Period Weeks    Status New    Target Date 09/08/21      Additional Long Term Goals   Additional Long Term Goals Yes      PT LONG TERM GOAL #6   Title Pt. will demonstrate improved functional strength by complete 5x STS <40 seconds.    Baseline 47 sec with UE support    Time 6    Period Weeks    Status New    Target Date 09/08/21                   Plan - 08/21/21 1446     Clinical Impression Statement Continued working on exercises to promote increased trunk movement during gait as patient still is reluctant with arm swing and heel strike during gait. She still showed decreased WS and trunk rotation even with encouragement on both sides. Gave instruction on her to start increasing her stride length, striking with heel first, and increasing arm swing with opposite UE from AD. Pt would continue to benefit from PT to improve stability and gait mechanics.    Personal Factors and Comorbidities Age;Comorbidity 3+;Fitness;Past/Current Experience;Time since onset of injury/illness/exacerbation    Comorbidities CAD s/p CABG in 2012, breast CA with R mastectomy 2022, HTN, HLD, L bunionectomy, post-concussion syndrome, R TKA    PT Frequency 2x / week    PT Duration 6 weeks    PT Treatment/Interventions ADLs/Self Care Home Management;Cryotherapy;Electrical Stimulation;Iontophoresis 59m/ml Dexamethasone;Moist Heat;Balance training;Therapeutic exercise;Therapeutic activities;Functional mobility training;Stair training;Gait training;Ultrasound;Neuromuscular re-education;Patient/family education;Manual techniques;Taping;Energy conservation;Dry needling;Passive range of motion    PT Next Visit Plan continue incorporate more dynamic movement with trunk rotation, gait training with cane    PT Home Exercise Plan Access Code: [email or web address]    Consulted and Agree with Plan of Care Patient             Patient will  benefit from skilled therapeutic intervention in order to improve the following deficits and impairments:  Abnormal gait, Hypomobility, Decreased activity tolerance, Decreased strength, Increased fascial restricitons, Pain, Impaired UE functional use, Decreased balance, Decreased mobility, Difficulty walking, Increased muscle spasms, Improper body mechanics, Decreased range of motion, Postural dysfunction, Impaired flexibility  Visit Diagnosis: Other symptoms and signs involving the musculoskeletal system  Chronic bilateral low back pain without sciatica  Unsteadiness on feet  Cervicalgia     Problem List Patient Active Problem List   Diagnosis Date Noted   Paroxysmal atrial fibrillation ([place] 05/15/2021   [place]able [place] ([place] 05/15/2021   Myelopathy ([place] 04/23/2021   Genetic testing 12/04/2020   Family history of breast cancer 11/18/2020   Erythrocytosis 11/14/2020   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 [place] [address]Phone: [phone]  Fax:  [phone] Name: BFadia MarlarMRN: [id number] of Birth: [birth year]

## 2021-08-25 ENCOUNTER — Ambulatory Visit: Payer: Medicare HMO | Admitting: Physical Therapy

## 2021-08-25 ENCOUNTER — Other Ambulatory Visit: Payer: Self-pay

## 2021-08-25 ENCOUNTER — Encounter: Payer: Self-pay | Admitting: Physical Therapy

## 2021-08-25 DIAGNOSIS — M545 Low back pain, unspecified: Secondary | ICD-10-CM | POA: Diagnosis not present

## 2021-08-25 DIAGNOSIS — M542 Cervicalgia: Secondary | ICD-10-CM | POA: Diagnosis not present

## 2021-08-25 DIAGNOSIS — R2681 Unsteadiness on feet: Secondary | ICD-10-CM | POA: Diagnosis not present

## 2021-08-25 DIAGNOSIS — R29898 Other symptoms and signs involving the musculoskeletal system: Secondary | ICD-10-CM

## 2021-08-25 DIAGNOSIS — G8929 Other chronic pain: Secondary | ICD-10-CM

## 2021-08-25 NOTE — Therapy (Signed)
[facility] [address]  [place], [place], [address] Phone: [phone]   Fax:  [phone]  Physical Therapy Treatment  Patient Details  Name: [name] MRN: [id number] Date of Birth: [birth year] Referring Provider (PT): Kary Kos, MD   Encounter Date: 08/25/2021   PT End of Session - 08/25/21 1506     Visit Number 17    Number of Visits 23    Date for PT Re-Evaluation 09/08/21    Authorization Type [employer] Medicare    Progress Note Due on Visit 8    PT Start Time 1315    PT Stop Time 1359    PT Time Calculation (min) 44 min    Activity Tolerance Patient tolerated treatment well    Behavior During Therapy [facility] for tasks assessed/performed             Past Medical History:  Diagnosis Date   Allergic rhinitis    Breast cancer ([place]) 10/17/2020   CAD (coronary artery disease)    , s/p CAGB 01/2011- LIMA to LAD patent, all other vein grafts occluded. Circumflex DES 3/13 placed following lateral ischemia on nuclear stress test   Cervical spondylosis    , bulging  discs   Disc herniation    L4-L5, L5-S1   Family history of breast cancer 11/18/2020   Hypertension    Mixed hyperlipidemia    Obesity    Post concussion syndrome    processes information slowly,    Postmenopausal    Prediabetes     Past Surgical History:  Procedure Laterality Date   ABDOMINAL HYSTERECTOMY     ANTERIOR CERVICAL DECOMP/DISCECTOMY FUSION N/A 04/23/2021   Procedure: Anterior Cervical Discectomy Fusion - Cervical Four- Cervical Five - Cervical Five -Cervical Six;  Surgeon: Kary Kos, MD;  Location: [place];  Service: Neurosurgery;  Laterality: N/A;  Anterior Cervical Discectomy Fusion - Cervical Four- Cervical Five - Cervical Five -Cervical Six   Bone Spur Left    Foot   BREAST BIOPSY     BREAST ENHANCEMENT SURGERY     BREAST IMPLANT EXCHANGE     replaced rupture implant   BREAST LUMPECTOMY WITH RADIOACTIVE SEED  LOCALIZATION Right 12/11/2020   Procedure: RIGHT BREAST LUMPECTOMY WITH RADIOACTIVE SEED LOCALIZATION;  Surgeon: Jovita Kussmaul, MD;  Location: [place];  Service: General;  Laterality: Right;   CARDIAC CATHETERIZATION     COLONOSCOPY     CORONARY ARTERY BYPASS GRAFT     JOINT REPLACEMENT     right total knee replacement   Left Bunionectomy     LEFT HEART CATHETERIZATION WITH CORONARY/GRAFT ANGIOGRAM  02/05/2012   Procedure: LEFT HEART CATHETERIZATION WITH Beatrix Fetters;  Surgeon: Candee Furbish, MD;  Location: [facility] CATH LAB;  Service: Cardiovascular;;   MASTECTOMY W/ SENTINEL NODE BIOPSY Right 02/19/2021   Procedure: RIGHT MASTECTOMY;  Surgeon: Jovita Kussmaul, MD;  Location: [place];  Service: General;  Laterality: Right;  RNFA, PEC BLOCK   PARTIAL HYSTERECTOMY     RE-EXCISION OF BREAST CANCER,SUPERIOR MARGINS Right 01/15/2021   Procedure: RE-EXCISION RIGHT BREAST MEDIAL MARGIN;  Surgeon: Jovita Kussmaul, MD;  Location: [place];  Service: General;  Laterality: Right;   REPLACEMENT TOTAL KNEE Right    SENTINEL NODE BIOPSY Right 02/19/2021   Procedure: SENTINEL LYMPH NODE BIOPSY;  Surgeon: Jovita Kussmaul, MD;  Location: [place];  Service: General;  Laterality: Right;   TONSILLECTOMY     TUBAL LIGATION  Bilateral     There were no vitals filed for this visit.   Subjective Assessment - 08/25/21 1318     Subjective Pt. reports some pain in R hip today.   Denies any new falls.  Has started using cane around house, but often furniture surfs because she forgets it and walks away.  She flipped her contour pillow around so smaller side under neck, and slept great last night.    Pertinent History CAD s/p CABG in 2012, breast CA with R mastectomy 2022, HTN, HLD, L bunionectomy, post-concussion syndrome, R TKA    Diagnostic tests none recent    Patient Stated Goals improve balance    Currently in Pain? Yes    Pain Score 1     Pain Location Hip    Pain Orientation Right     Pain Descriptors / Indicators Sore                [facility] PT Assessment - 08/25/21 0001       Assessment   Medical Diagnosis Disease of spinal cord    Referring Provider (PT) Kary Kos, MD    Onset Date/Surgical Date 04/23/21    Hand Dominance Right      AROM   Cervical - Right Rotation 50    Cervical - Left Rotation 50      Strength   Right Hip Flexion 4+/5    Left Hip Flexion 4+/5                           [facility] Adult PT Treatment/Exercise - 08/25/21 0001       Exercises   Exercises Knee/Hip      Neck Exercises: Machines for Strengthening   Nustep L6 x 7 min                 Balance Exercises - 08/25/21 0001       Balance Exercises: Standing   Step Over Hurdles / Cones stepping foward and back over YTB, mirror for posture, CGA for safety 2 x 10 bil; side stepping over YTB 2 x 10 bil with CGA and mirror for feed back.    Sit to Stand Without upper extremity support   x 10 cues for foward weight shift, CGA for safety   Other Standing Exercises dynamic reaching outside BOS x 10 bil, dynamic step and reach outside BOS 2 x 10                  PT Short Term Goals - 06/25/21 1449       PT SHORT TERM GOAL #1   Title Patient to be independent with initial HEP.    Time 3    Period Weeks    Status Achieved    Target Date 07/15/21               PT Long Term Goals - 08/25/21 1356       PT LONG TERM GOAL #1   Title Patient to be independent with advanced HEP.    Time 6    Period Weeks    Status On-going   08/25/21-met for current HEP   Target Date 09/08/21      PT LONG TERM GOAL #2   Title Patient to demonstrate B UE and LE strength >/=4+/5.    Time 6    Period Weeks    Status Achieved   08/25/21- 4+/5 bil hip flexor strength  PT LONG TERM GOAL #3   Title Patient to demonstrate cervical AROM WFL and without pain limiting.    Time 6    Period Weeks    Status On-going   08/25/21- progressing, 50 deg cervical  rotation, no pain, still limited trunk rotation.     PT LONG TERM GOAL #4   Title Patient to report 70% improvement in LBP.    Time 6    Period Weeks    Status Achieved   no low back pain     PT LONG TERM GOAL #5   Title Pt will improve balance as demonstrated by 45/56 on berg to decrease fall risk    Baseline 36/56    Time 6    Period Weeks    Status New      PT LONG TERM GOAL #6   Title Pt. will demonstrate improved functional strength by complete 5x STS <40 seconds.    Baseline 47 sec with UE support    Time 6    Period Weeks    Status New                   Plan - 08/25/21 1507     Clinical Impression Statement Focus of today's interventions was progressing balance exercises and rotation to improve turning.  She required CGA throughout for safety, with 2 large LOB with dynamic stepping and rotation needing MinA to recover balance.  Very fatigued at end of session, reporting more R hip pain, but declined offer of modalities.  She is making good progress, demonstrating improving LE strength, met STG#2 today.  She would benefit from continued skilled therapy to improve balance and decrease risk of falls.    Personal Factors and Comorbidities Age;Comorbidity 3+;Fitness;Past/Current Experience;Time since onset of injury/illness/exacerbation    Comorbidities CAD s/p CABG in 2012, breast CA with R mastectomy 2022, HTN, HLD, L bunionectomy, post-concussion syndrome, R TKA    PT Frequency 2x / week    PT Duration 6 weeks    PT Treatment/Interventions ADLs/Self Care Home Management;Cryotherapy;Electrical Stimulation;Iontophoresis 63m/ml Dexamethasone;Moist Heat;Balance training;Therapeutic exercise;Therapeutic activities;Functional mobility training;Stair training;Gait training;Ultrasound;Neuromuscular re-education;Patient/family education;Manual techniques;Taping;Energy conservation;Dry needling;Passive range of motion    PT Next Visit Plan continue incorporate more dynamic  movement with trunk rotation, gait training with cane    PT Home Exercise Plan Access Code: [email or web address]    Consulted and Agree with Plan of Care Patient             Patient will benefit from skilled therapeutic intervention in order to improve the following deficits and impairments:  Abnormal gait, Hypomobility, Decreased activity tolerance, Decreased strength, Increased fascial restricitons, Pain, Impaired UE functional use, Decreased balance, Decreased mobility, Difficulty walking, Increased muscle spasms, Improper body mechanics, Decreased range of motion, Postural dysfunction, Impaired flexibility  Visit Diagnosis: Other symptoms and signs involving the musculoskeletal system  Chronic bilateral low back pain without sciatica  Unsteadiness on feet  Cervicalgia     Problem List Patient Active Problem List   Diagnosis Date Noted   Paroxysmal atrial fibrillation ([place] 05/15/2021   [place]able [place] (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

## 2021-08-28 ENCOUNTER — Ambulatory Visit: Payer: Medicare HMO

## 2021-08-28 ENCOUNTER — Other Ambulatory Visit: Payer: Self-pay

## 2021-08-28 DIAGNOSIS — R29898 Other symptoms and signs involving the musculoskeletal system: Secondary | ICD-10-CM

## 2021-08-28 DIAGNOSIS — M542 Cervicalgia: Secondary | ICD-10-CM

## 2021-08-28 DIAGNOSIS — G8929 Other chronic pain: Secondary | ICD-10-CM | POA: Diagnosis not present

## 2021-08-28 DIAGNOSIS — M545 Low back pain, unspecified: Secondary | ICD-10-CM | POA: Diagnosis not present

## 2021-08-28 DIAGNOSIS — R2681 Unsteadiness on feet: Secondary | ICD-10-CM

## 2021-08-28 NOTE — Therapy (Signed)
[facility] [address]  [name], [place], [address] Phone: [phone]   Fax:  [phone]  Physical Therapy Treatment  Patient Details  Name: [name] MRN: [id number] Date of Birth: 1946/01/19 Referring Provider (PT): Kary Kos, MD   Encounter Date: 08/28/2021   PT End of Session - 08/28/21 1402     Visit Number 18    Number of Visits 23    Date for PT Re-Evaluation 09/08/21    Authorization Type [employer]    Progress Note Due on Visit 78    PT Start Time 1317    PT Stop Time 1357    PT Time Calculation (min) 40 min    Activity Tolerance Patient tolerated treatment well    Behavior During Therapy [facility] for tasks assessed/performed             Past Medical History:  Diagnosis Date   Allergic rhinitis    Breast cancer ([place]) 10/17/2020   CAD (coronary artery disease)    , s/p CAGB 01/2011- LIMA to LAD patent, all other vein grafts occluded. Circumflex DES 3/13 placed following lateral ischemia on nuclear stress test   Cervical spondylosis    , bulging  discs   Disc herniation    L4-L5, L5-S1   Family history of breast cancer 11/18/2020   Hypertension    Mixed hyperlipidemia    Obesity    Post concussion syndrome    processes information slowly,    Postmenopausal    Prediabetes     Past Surgical History:  Procedure Laterality Date   ABDOMINAL HYSTERECTOMY     ANTERIOR CERVICAL DECOMP/DISCECTOMY FUSION N/A 04/23/2021   Procedure: Anterior Cervical Discectomy Fusion - Cervical Four- Cervical Five - Cervical Five -Cervical Six;  Surgeon: Kary Kos, MD;  Location: [place];  Service: Neurosurgery;  Laterality: N/A;  Anterior Cervical Discectomy Fusion - Cervical Four- Cervical Five - Cervical Five -Cervical Six   Bone Spur Left    Foot   BREAST BIOPSY     BREAST ENHANCEMENT SURGERY     BREAST IMPLANT EXCHANGE     replaced rupture implant   BREAST LUMPECTOMY WITH RADIOACTIVE SEED  LOCALIZATION Right 12/11/2020   Procedure: RIGHT BREAST LUMPECTOMY WITH RADIOACTIVE SEED LOCALIZATION;  Surgeon: Jovita Kussmaul, MD;  Location: [place];  Service: General;  Laterality: Right;   CARDIAC CATHETERIZATION     COLONOSCOPY     CORONARY ARTERY BYPASS GRAFT     JOINT REPLACEMENT     right total knee replacement   Left Bunionectomy     LEFT HEART CATHETERIZATION WITH CORONARY/GRAFT ANGIOGRAM  02/05/2012   Procedure: LEFT HEART CATHETERIZATION WITH Beatrix Fetters;  Surgeon: Candee Furbish, MD;  Location: [facility] CATH LAB;  Service: Cardiovascular;;   MASTECTOMY W/ SENTINEL NODE BIOPSY Right 02/19/2021   Procedure: RIGHT MASTECTOMY;  Surgeon: Jovita Kussmaul, MD;  Location: [place];  Service: General;  Laterality: Right;  RNFA, PEC BLOCK   PARTIAL HYSTERECTOMY     RE-EXCISION OF BREAST CANCER,SUPERIOR MARGINS Right 01/15/2021   Procedure: RE-EXCISION RIGHT BREAST MEDIAL MARGIN;  Surgeon: Jovita Kussmaul, MD;  Location: [place];  Service: General;  Laterality: Right;   REPLACEMENT TOTAL KNEE Right    SENTINEL NODE BIOPSY Right 02/19/2021   Procedure: SENTINEL LYMPH NODE BIOPSY;  Surgeon: Jovita Kussmaul, MD;  Location: [place];  Service: General;  Laterality: Right;   TONSILLECTOMY     TUBAL LIGATION  Bilateral     There were no vitals filed for this visit.   Subjective Assessment - 08/28/21 1320     Subjective Pt notes that she has been trying to help her son get things situated for a new house, has been a bit stressful. R hip is bothering her    Pertinent History CAD s/p CABG in 2012, breast CA with R mastectomy 2022, HTN, HLD, L bunionectomy, post-concussion syndrome, R TKA    Diagnostic tests none recent    Patient Stated Goals improve balance    Currently in Pain? No/denies    Pain Score --   gets up to an 8 when getting into car sometimes                              [facility] Adult PT Treatment/Exercise - 08/28/21 0001        Ambulation/Gait   Ambulation Distance (Feet) 280 Feet    Assistive device Straight cane    Gait Pattern Step-through pattern;Decreased arm swing - right;Decreased arm swing - left;Decreased stride length;Decreased hip/knee flexion - right;Decreased hip/knee flexion - left      Neck Exercises: Standing   Other Standing Exercises trunk rotation with reaching using opposite extremities 15 reps each      Lumbar Exercises: Aerobic   Nustep L6x1mn      Lumbar Exercises: Standing   Other Standing Lumbar Exercises B shoulder flexion and abduction 10 reps with trunk extension    Other Standing Lumbar Exercises B arm swing with 1# weights 2x10  Wndsheild whippers with B UE 2x10 with red TB                      PT Short Term Goals - 06/25/21 1449       PT SHORT TERM GOAL #1   Title Patient to be independent with initial HEP.    Time 3    Period Weeks    Status Achieved    Target Date 07/15/21               PT Long Term Goals - 08/25/21 1356       PT LONG TERM GOAL #1   Title Patient to be independent with advanced HEP.    Time 6    Period Weeks    Status On-going   08/25/21-met for current HEP   Target Date 09/08/21      PT LONG TERM GOAL #2   Title Patient to demonstrate B UE and LE strength >/=4+/5.    Time 6    Period Weeks    Status Achieved   08/25/21- 4+/5 bil hip flexor strength     PT LONG TERM GOAL #3   Title Patient to demonstrate cervical AROM WFL and without pain limiting.    Time 6    Period Weeks    Status On-going   08/25/21- progressing, 50 deg cervical rotation, no pain, still limited trunk rotation.     PT LONG TERM GOAL #4   Title Patient to report 70% improvement in LBP.    Time 6    Period Weeks    Status Achieved   no low back pain     PT LONG TERM GOAL #5   Title Pt will improve balance as demonstrated by 45/56 on berg to decrease fall risk    Baseline 36/56    Time 6    Period Weeks  Status New      PT LONG TERM GOAL #6    Title Pt. will demonstrate improved functional strength by complete 5x STS <40 seconds.    Baseline 47 sec with UE support    Time 6    Period Weeks    Status New                   Plan - 08/28/21 1402     Clinical Impression Statement Focused today's interventions on increaseing trunk rotation and arm swing. She required cues to increase pelvic rotation with arm swing. She did demonstrate better reciprocal arm swing after the exercises today. She only notes R hip pain when getting in and out of her car, no c/o pain during session. She would continue to benefit from dynamic WS exercises to improve gait dynamics and reduce fall risk.    Personal Factors and Comorbidities Age;Comorbidity 3+;Fitness;Past/Current Experience;Time since onset of injury/illness/exacerbation    Comorbidities CAD s/p CABG in 2012, breast CA with R mastectomy 2022, HTN, HLD, L bunionectomy, post-concussion syndrome, R TKA    PT Frequency 2x / week    PT Duration 6 weeks    PT Treatment/Interventions ADLs/Self Care Home Management;Cryotherapy;Electrical Stimulation;Iontophoresis 87m/ml Dexamethasone;Moist Heat;Balance training;Therapeutic exercise;Therapeutic activities;Functional mobility training;Stair training;Gait training;Ultrasound;Neuromuscular re-education;Patient/family education;Manual techniques;Taping;Energy conservation;Dry needling;Passive range of motion    PT Next Visit Plan continue incorporate more dynamic movement with trunk rotation, gait training with cane    PT Home Exercise Plan Access Code: [email or web address]    Consulted and Agree with Plan of Care Patient             Patient will benefit from skilled therapeutic intervention in order to improve the following deficits and impairments:  Abnormal gait, Hypomobility, Decreased activity tolerance, Decreased strength, Increased fascial restricitons, Pain, Impaired UE functional use, Decreased balance, Decreased mobility, Difficulty walking,  Increased muscle spasms, Improper body mechanics, Decreased range of motion, Postural dysfunction, Impaired flexibility  Visit Diagnosis: Other symptoms and signs involving the musculoskeletal system  Chronic bilateral low back pain without sciatica  Unsteadiness on feet  Cervicalgia     Problem List Patient Active Problem List   Diagnosis Date Noted   Paroxysmal atrial fibrillation ([place] 05/15/2021   Secondary hypercoagulable state ([place] 05/15/2021   Myelopathy ([place] 04/23/2021   Genetic testing 12/04/2020   Family history of breast cancer 11/18/2020   Erythrocytosis 11/14/2020   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

## 2021-09-01 ENCOUNTER — Encounter: Payer: Medicare HMO | Admitting: Physical Therapy

## 2021-09-02 ENCOUNTER — Ambulatory Visit: Payer: Medicare HMO

## 2021-09-02 ENCOUNTER — Other Ambulatory Visit: Payer: Self-pay

## 2021-09-02 DIAGNOSIS — G8929 Other chronic pain: Secondary | ICD-10-CM

## 2021-09-02 DIAGNOSIS — R2681 Unsteadiness on feet: Secondary | ICD-10-CM

## 2021-09-02 DIAGNOSIS — R29898 Other symptoms and signs involving the musculoskeletal system: Secondary | ICD-10-CM | POA: Diagnosis not present

## 2021-09-02 DIAGNOSIS — M542 Cervicalgia: Secondary | ICD-10-CM

## 2021-09-02 DIAGNOSIS — M545 Low back pain, unspecified: Secondary | ICD-10-CM | POA: Diagnosis not present

## 2021-09-02 NOTE — Therapy (Signed)
[facility] [address]  [place], [place], [address] Phone: [phone]   Fax:  [phone]  Physical Therapy Treatment  Patient Details  Name: [name] MRN: [id number] Date of Birth: 1946/03/08 Referring Provider (PT): Kary Kos, MD   Encounter Date: 09/02/2021   PT End of Session - 09/02/21 1043     Visit Number 19    Number of Visits 23    Date for PT Re-Evaluation 09/08/21    Authorization Type [employer] Medicare    Progress Note Due on Visit 20    PT Start Time 0930    PT Stop Time 1013    PT Time Calculation (min) 43 min    Activity Tolerance Patient tolerated treatment well    Behavior During Therapy [facility] for tasks assessed/performed             Past Medical History:  Diagnosis Date   Allergic rhinitis    Breast cancer ([place]) 10/17/2020   CAD (coronary artery disease)    , s/p CAGB 01/2011- LIMA to LAD patent, all other vein grafts occluded. Circumflex DES 3/13 placed following lateral ischemia on nuclear stress test   Cervical spondylosis    , bulging  discs   Disc herniation    L4-L5, L5-S1   Family history of breast cancer 11/18/2020   Hypertension    Mixed hyperlipidemia    Obesity    Post concussion syndrome    processes information slowly,    Postmenopausal    Prediabetes     Past Surgical History:  Procedure Laterality Date   ABDOMINAL HYSTERECTOMY     ANTERIOR CERVICAL DECOMP/DISCECTOMY FUSION N/A 04/23/2021   Procedure: Anterior Cervical Discectomy Fusion - Cervical Four- Cervical Five - Cervical Five -Cervical Six;  Surgeon: Kary Kos, MD;  Location: [place];  Service: Neurosurgery;  Laterality: N/A;  Anterior Cervical Discectomy Fusion - Cervical Four- Cervical Five - Cervical Five -Cervical Six   Bone Spur Left    Foot   BREAST BIOPSY     BREAST ENHANCEMENT SURGERY     BREAST IMPLANT EXCHANGE     replaced rupture implant   BREAST LUMPECTOMY WITH RADIOACTIVE SEED  LOCALIZATION Right 12/11/2020   Procedure: RIGHT BREAST LUMPECTOMY WITH RADIOACTIVE SEED LOCALIZATION;  Surgeon: Jovita Kussmaul, MD;  Location: [place];  Service: General;  Laterality: Right;   CARDIAC CATHETERIZATION     COLONOSCOPY     CORONARY ARTERY BYPASS GRAFT     JOINT REPLACEMENT     right total knee replacement   Left Bunionectomy     LEFT HEART CATHETERIZATION WITH CORONARY/GRAFT ANGIOGRAM  02/05/2012   Procedure: LEFT HEART CATHETERIZATION WITH Beatrix Fetters;  Surgeon: Candee Furbish, MD;  Location[facility] CATH LAB;  Service: Cardiovascular;;   MASTECTOMY W/ SENTINEL NODE BIOPSY Right 02/19/2021   Procedure: RIGHT MASTECTOMY;  Surgeon: Jovita Kussmaul, MD;  Location: [place];  Service: General;  Laterality: Right;  RNFA, PEC BLOCK   PARTIAL HYSTERECTOMY     RE-EXCISION OF BREAST CANCER,SUPERIOR MARGINS Right 01/15/2021   Procedure: RE-EXCISION RIGHT BREAST MEDIAL MARGIN;  Surgeon: Jovita Kussmaul, MD;  Location: [place];  Service: General;  Laterality: Right;   REPLACEMENT TOTAL KNEE Right    SENTINEL NODE BIOPSY Right 02/19/2021   Procedure: SENTINEL LYMPH NODE BIOPSY;  Surgeon: Jovita Kussmaul, MD;  Location: [place];  Service: General;  Laterality: Right;   TONSILLECTOMY     TUBAL LIGATION  Bilateral     There were no vitals filed for this visit.   Subjective Assessment - 09/02/21 0934     Subjective Pt reports that her R hip has been bothering her lately. Sneezed the other day and almost fell.    Pertinent History CAD s/p CABG in 2012, breast CA with R mastectomy 2022, HTN, HLD, L bunionectomy, post-concussion syndrome, R TKA    Diagnostic tests none recent    Patient Stated Goals improve balance    Currently in Pain? Yes    Pain Score 3     Pain Location Hip    Pain Orientation Right    Pain Descriptors / Indicators Sore    Pain Type Acute pain                               [facility] Adult PT Treatment/Exercise - 09/02/21  0001       Lumbar Exercises: Aerobic   Nustep L6x30mn      Lumbar Exercises: Standing   Other Standing Lumbar Exercises B UE cabinet reaches with trunk rotations 2x10; 2nd set progressed to 2# weight    Other Standing Lumbar Exercises bird dog at counter 15 reps                 Balance Exercises - 09/02/21 0001       Balance Exercises: Standing   Other Standing Exercises side steps, backwards walking, brading along the counter 2x each   Other Standing Exercises Comments B UE punches with staggered stance and trunk rotation 12 reps                  PT Short Term Goals - 06/25/21 1449       PT SHORT TERM GOAL #1   Title Patient to be independent with initial HEP.    Time 3    Period Weeks    Status Achieved    Target Date 07/15/21               PT Long Term Goals - 08/25/21 1356       PT LONG TERM GOAL #1   Title Patient to be independent with advanced HEP.    Time 6    Period Weeks    Status On-going   08/25/21-met for current HEP   Target Date 09/08/21      PT LONG TERM GOAL #2   Title Patient to demonstrate B UE and LE strength >/=4+/5.    Time 6    Period Weeks    Status Achieved   08/25/21- 4+/5 bil hip flexor strength     PT LONG TERM GOAL #3   Title Patient to demonstrate cervical AROM WFL and without pain limiting.    Time 6    Period Weeks    Status On-going   08/25/21- progressing, 50 deg cervical rotation, no pain, still limited trunk rotation.     PT LONG TERM GOAL #4   Title Patient to report 70% improvement in LBP.    Time 6    Period Weeks    Status Achieved   no low back pain     PT LONG TERM GOAL #5   Title Pt will improve balance as demonstrated by 45/56 on berg to decrease fall risk    Baseline 36/56    Time 6    Period Weeks    Status New      PT LONG  TERM GOAL #6   Title Pt. will demonstrate improved functional strength by complete 5x STS <40 seconds.    Baseline 47 sec with UE support    Time 6    Period  Weeks    Status New                   Plan - 09/02/21 1044     Clinical Impression Statement Pt demonstrated better trunk rotation and arm swing during gait coming into clinic today. She did note increased pain in her hip which causes increased time taken with STS transfers. Progressed core strengthening and mobility exercises today, pt did have a couple incidents of LOB but able to self recover. Overall she noted improvement with hip pain post session, close SBA required for safety during balance exercises.    Personal Factors and Comorbidities Age;Comorbidity 3+;Fitness;Past/Current Experience;Time since onset of injury/illness/exacerbation    Comorbidities CAD s/p CABG in 2012, breast CA with R mastectomy 2022, HTN, HLD, L bunionectomy, post-concussion syndrome, R TKA    PT Frequency 2x / week    PT Duration 6 weeks    PT Treatment/Interventions ADLs/Self Care Home Management;Cryotherapy;Electrical Stimulation;Iontophoresis 15m/ml Dexamethasone;Moist Heat;Balance training;Therapeutic exercise;Therapeutic activities;Functional mobility training;Stair training;Gait training;Ultrasound;Neuromuscular re-education;Patient/family education;Manual techniques;Taping;Energy conservation;Dry needling;Passive range of motion    PT Next Visit Plan continue incorporate more dynamic movement with trunk rotation, gait training with cane    PT Home Exercise Plan Access Code: [email or web address]    Consulted and Agree with Plan of Care Patient             Patient will benefit from skilled therapeutic intervention in order to improve the following deficits and impairments:  Abnormal gait, Hypomobility, Decreased activity tolerance, Decreased strength, Increased fascial restricitons, Pain, Impaired UE functional use, Decreased balance, Decreased mobility, Difficulty walking, Increased muscle spasms, Improper body mechanics, Decreased range of motion, Postural dysfunction, Impaired flexibility  Visit  Diagnosis: Other symptoms and signs involving the musculoskeletal system  Chronic bilateral low back pain without sciatica  Unsteadiness on feet  Cervicalgia     Problem List Patient Active Problem List   Diagnosis Date Noted   Paroxysmal atrial fibrillation ([place] 05/15/2021   Secondary hypercoagulable state ([place] 05/15/2021   Myelopathy ([place] 04/23/2021   Genetic testing 12/04/2020   Family history of breast cancer 11/18/2020   Erythrocytosis 11/14/2020   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

## 2021-09-03 DIAGNOSIS — Z23 Encounter for immunization: Secondary | ICD-10-CM | POA: Diagnosis not present

## 2021-09-04 ENCOUNTER — Ambulatory Visit: Payer: Medicare HMO | Admitting: Physical Therapy

## 2021-09-04 ENCOUNTER — Other Ambulatory Visit: Payer: Self-pay

## 2021-09-04 ENCOUNTER — Encounter: Payer: Self-pay | Admitting: Physical Therapy

## 2021-09-04 DIAGNOSIS — M545 Low back pain, unspecified: Secondary | ICD-10-CM | POA: Diagnosis not present

## 2021-09-04 DIAGNOSIS — M542 Cervicalgia: Secondary | ICD-10-CM | POA: Diagnosis not present

## 2021-09-04 DIAGNOSIS — R29898 Other symptoms and signs involving the musculoskeletal system: Secondary | ICD-10-CM

## 2021-09-04 DIAGNOSIS — R2681 Unsteadiness on feet: Secondary | ICD-10-CM | POA: Diagnosis not present

## 2021-09-04 DIAGNOSIS — H00015 Hordeolum externum left lower eyelid: Secondary | ICD-10-CM | POA: Diagnosis not present

## 2021-09-04 DIAGNOSIS — G8929 Other chronic pain: Secondary | ICD-10-CM

## 2021-09-04 NOTE — Therapy (Signed)
[facility] [address]  [place], [place], [address] Phone: [phone]   Fax:  [phone]  Physical Therapy Treatment/Progress Note Progress Note Reporting Period 07/28/2021 to 09/04/2021  See note below for Objective Data and Assessment of Progress/Goals.     Patient Details  Name: [name] MRN: [id number] Date of Birth: 06/06/1946 Referring Provider (PT): Kary Kos, MD   Encounter Date: 09/04/2021   PT End of Session - 09/04/21 5573     Visit Number 20    Number of Visits 32    Date for PT Re-Evaluation 10/16/21    Authorization Type [employer]    Progress Note Due on Visit 22    PT Start Time 1317    PT Stop Time 1405    PT Time Calculation (min) 48 min    Activity Tolerance Patient tolerated treatment well    Behavior During Therapy [facility] for tasks assessed/performed             Past Medical History:  Diagnosis Date   Allergic rhinitis    Breast cancer ([place]) 10/17/2020   CAD (coronary artery disease)    , s/p CAGB 01/2011- LIMA to LAD patent, all other vein grafts occluded. Circumflex DES 3/13 placed following lateral ischemia on nuclear stress test   Cervical spondylosis    , bulging  discs   Disc herniation    L4-L5, L5-S1   Family history of breast cancer 11/18/2020   Hypertension    Mixed hyperlipidemia    Obesity    Post concussion syndrome    processes information slowly,    Postmenopausal    Prediabetes     Past Surgical History:  Procedure Laterality Date   ABDOMINAL HYSTERECTOMY     ANTERIOR CERVICAL DECOMP/DISCECTOMY FUSION N/A 04/23/2021   Procedure: Anterior Cervical Discectomy Fusion - Cervical Four- Cervical Five - Cervical Five -Cervical Six;  Surgeon: Kary Kos, MD;  Location: [place];  Service: Neurosurgery;  Laterality: N/A;  Anterior Cervical Discectomy Fusion - Cervical Four- Cervical Five - Cervical Five -Cervical Six   Bone Spur Left    Foot   BREAST  BIOPSY     BREAST ENHANCEMENT SURGERY     BREAST IMPLANT EXCHANGE     replaced rupture implant   BREAST LUMPECTOMY WITH RADIOACTIVE SEED LOCALIZATION Right 12/11/2020   Procedure: RIGHT BREAST LUMPECTOMY WITH RADIOACTIVE SEED LOCALIZATION;  Surgeon: Jovita Kussmaul, MD;  Location: [place];  Service: General;  Laterality: Right;   CARDIAC CATHETERIZATION     COLONOSCOPY     CORONARY ARTERY BYPASS GRAFT     JOINT REPLACEMENT     right total knee replacement   Left Bunionectomy     LEFT HEART CATHETERIZATION WITH CORONARY/GRAFT ANGIOGRAM  02/05/2012   Procedure: LEFT HEART CATHETERIZATION WITH Beatrix Fetters;  Surgeon: Candee Furbish, MD;  Location: [facility] CATH LAB;  Service: Cardiovascular;;   MASTECTOMY W/ SENTINEL NODE BIOPSY Right 02/19/2021   Procedure: RIGHT MASTECTOMY;  Surgeon: Jovita Kussmaul, MD;  Location: [place];  Service: General;  Laterality: Right;  RNFA, [place]   PARTIAL HYSTERECTOMY     RE-EXCISION OF BREAST CANCER,SUPERIOR MARGINS Right 01/15/2021   Procedure: RE-EXCISION RIGHT BREAST MEDIAL MARGIN;  Surgeon: Jovita Kussmaul, MD;  Location: [place];  Service: General;  Laterality: Right;   REPLACEMENT TOTAL KNEE Right    SENTINEL NODE BIOPSY Right 02/19/2021   Procedure: SENTINEL LYMPH NODE BIOPSY;  Surgeon: Marlou Starks,  Sena Hitch, MD;  Location: [place];  Service: General;  Laterality: Right;   TONSILLECTOMY     TUBAL LIGATION Bilateral     There were no vitals filed for this visit.   Subjective Assessment - 09/04/21 1322     Subjective Patient reports she has a stye on her L eye and went to eye doctor today, they gave her medication in eye so now she is seeing double.  She asked her husband to get her walker out of the closet because she does not want to fall.  Otherwise doing well.    Pertinent History CAD s/p CABG in 2012, breast CA with R mastectomy 2022, HTN, HLD, L bunionectomy, post-concussion syndrome, R TKA    Diagnostic tests none recent     Patient Stated Goals improve balance    Currently in Pain? Yes    Pain Score 1    took 3 tylenol and put biofreeze on it   Pain Location Hip    Pain Orientation Right                [facility] PT Assessment - 09/04/21 0001       Assessment   Medical Diagnosis Disease of spinal cord    Referring Provider (PT) Kary Kos, MD    Onset Date/Surgical Date 04/23/21    Hand Dominance Right      Precautions   Precautions Fall      Restrictions   Weight Bearing Restrictions No      Balance Screen   Has the patient fallen in the past 6 months Yes    Has the patient had a decrease in activity level because of a fear of falling?  Yes    Is the patient reluctant to leave their home because of a fear of falling?  Yes      Prior Function   Level of Independence Independent      Observation/Other Assessments   Focus on Therapeutic Outcomes (FOTO)  Cervical 59      AROM   Cervical Flexion 38    Cervical Extension 40    Cervical - Right Side Bend 22    Cervical - Left Side Bend 20    Cervical - Right Rotation 58    Cervical - Left Rotation 60      Strength   Right Hip Flexion 4/5   limited by pain   Right Hip ABduction 5/5    Right Hip ADduction 5/5    Left Hip Extension 4+/5    Left Hip ABduction 5/5    Left Hip ADduction 5/5    Right Knee Flexion 4+/5    Right Knee Extension 5/5    Left Knee Flexion 5/5    Left Knee Extension 5/5      Balance   Balance Assessed Yes      Standardized Balance Assessment   Standardized Balance Assessment Berg Balance Test      Berg Balance Test   Sit to Stand Able to stand  independently using hands    Standing Unsupported Able to stand safely 2 minutes    Sitting with Back Unsupported but Feet Supported on Floor or Stool Able to sit safely and securely 2 minutes    Stand to Sit Controls descent by using hands    Transfers Able to transfer with verbal cueing and /or supervision    Standing Unsupported with Eyes Closed Able to stand 10  seconds safely    Standing Unsupported with Feet Together Able  to place feet together independently and stand 1 minute safely    From Standing, Reach Forward with Outstretched Arm Can reach forward >5 cm safely (2")    From Standing Position, Pick up Object from Floor Unable to pick up shoe, but reaches 2-5 cm (1-2") from shoe and balances independently    From Standing Position, Turn to Look Behind Over each Shoulder Turn sideways only but maintains balance    Turn 360 Degrees Needs close supervision or verbal cueing    Standing Unsupported, Alternately Place Feet on Step/Stool Needs assistance to keep from falling or unable to try    Standing Unsupported, One Foot in [place] balance while stepping or standing    Standing on One Leg Unable to try or needs assist to prevent fall    Total Score 31                           [facility] Adult PT Treatment/Exercise - 09/04/21 0001       Lumbar Exercises: Aerobic   Recumbent Bike L1 x 6 min      Lumbar Exercises: Seated   Sit to Stand 5 reps   cues on proper technique                      PT Short Term Goals - 06/25/21 1449       PT SHORT TERM GOAL #1   Title Patient to be independent with initial HEP.    Time 3    Period Weeks    Status Achieved    Target Date 07/15/21               PT Long Term Goals - 09/04/21 1324       PT LONG TERM GOAL #1   Title Patient to be independent with advanced HEP.    Time 6    Period Weeks    Status On-going   09/04/21- doing neck exercises regularly, butvague about other exercises.   Target Date 10/16/21      PT LONG TERM GOAL #2   Title Patient to demonstrate B UE and LE strength >/=4+/5.    Time 6    Period Weeks    Status Achieved   08/25/21- 4+/5 bil hip flexor strength     PT LONG TERM GOAL #3   Title Patient to demonstrate cervical AROM WFL and without pain limiting.    Time 6    Period Weeks    Status Achieved   09/04/21- 60L, 58 R rotation, 40  extension, 38 flexion, no pain   Target Date 09/08/21      PT LONG TERM GOAL #4   Title Patient to report 70% improvement in LBP.    Time 6    Period Weeks    Status Achieved   no low back pain     PT LONG TERM GOAL #5   Title Pt will improve balance as demonstrated by 45/56 on berg to decrease fall risk    Baseline 36/56    Time 6    Period Weeks    Status On-going   31/56   Target Date 10/16/21      PT LONG TERM GOAL #6   Title Pt. will demonstrate improved functional strength by complete 5x STS <40 seconds.    Baseline 47 sec with UE support    Time 6    Period Weeks    Status On-going  continues to need UE support and increased time.   Target Date 10/16/21                   Plan - 09/04/21 1412     Clinical Impression Statement Overall patient has made good progress and has met LTG 2,3 and 4, but she continues to demonstrate high risk of falls as demonstrated by [name] of 31/56 today.  Her cervical ROM has improved but all global movements continue to be very slow and guarded.  Her FOTO score does not reflect progress, scored 59 today.  She is having difficulty today due to vision impairements, with large stye in her eye causing blurred vision, but she does not feel confident with her balance and would benefit from continued skilled therapy to decrease risk of falls and injury.    Personal Factors and Comorbidities Age;Comorbidity 3+;Fitness;Past/Current Experience;Time since onset of injury/illness/exacerbation    Comorbidities CAD s/p CABG in 2012, breast CA with R mastectomy 2022, HTN, HLD, L bunionectomy, post-concussion syndrome, R TKA    PT Frequency 2x / week    PT Duration 6 weeks    PT Treatment/Interventions ADLs/Self Care Home Management;Cryotherapy;Electrical Stimulation;Iontophoresis 32m/ml Dexamethasone;Moist Heat;Balance training;Therapeutic exercise;Therapeutic activities;Functional mobility training;Stair training;Gait training;Ultrasound;Neuromuscular  re-education;Patient/family education;Manual techniques;Taping;Energy conservation;Dry needling;Passive range of motion    PT Next Visit Plan continue incorporate more dynamic movement with trunk rotation, gait training with cane    PT Home Exercise Plan Access Code: [email or web address]    Consulted and Agree with Plan of Care Patient             Patient will benefit from skilled therapeutic intervention in order to improve the following deficits and impairments:  Abnormal gait, Hypomobility, Decreased activity tolerance, Decreased strength, Increased fascial restricitons, Pain, Impaired UE functional use, Decreased balance, Decreased mobility, Difficulty walking, Increased muscle spasms, Improper body mechanics, Decreased range of motion, Postural dysfunction, Impaired flexibility  Visit Diagnosis: Other symptoms and signs involving the musculoskeletal system  Chronic bilateral low back pain without sciatica  Unsteadiness on feet  Cervicalgia     Problem List Patient Active Problem List   Diagnosis Date Noted   Paroxysmal atrial fibrillation ([place] 05/15/2021   Secondary hypercoagulable state ([place] 05/15/2021   Myelopathy ([place] 04/23/2021   Genetic testing 12/04/2020   Family history of breast cancer 11/18/2020   Erythrocytosis 11/14/2020   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

## 2021-09-08 ENCOUNTER — Ambulatory Visit: Payer: Medicare HMO | Attending: Neurosurgery

## 2021-09-08 ENCOUNTER — Other Ambulatory Visit: Payer: Self-pay

## 2021-09-08 DIAGNOSIS — G8929 Other chronic pain: Secondary | ICD-10-CM | POA: Insufficient documentation

## 2021-09-08 DIAGNOSIS — R29898 Other symptoms and signs involving the musculoskeletal system: Secondary | ICD-10-CM | POA: Diagnosis not present

## 2021-09-08 DIAGNOSIS — M545 Low back pain, unspecified: Secondary | ICD-10-CM | POA: Insufficient documentation

## 2021-09-08 DIAGNOSIS — R2681 Unsteadiness on feet: Secondary | ICD-10-CM | POA: Insufficient documentation

## 2021-09-08 DIAGNOSIS — M542 Cervicalgia: Secondary | ICD-10-CM | POA: Diagnosis not present

## 2021-09-08 NOTE — Therapy (Signed)
[facility] [address]  [place], [place], [address] Phone: [phone]   Fax:  [phone]  Physical Therapy Treatment  Patient Details  Name: Phala Schraeder MRN: [id number] Date of Birth: 27-May-1946 Referring Provider (PT): Kary Kos, MD   Encounter Date: 09/08/2021   PT End of Session - 09/08/21 1535     Visit Number 21    Number of Visits 32    Date for PT Re-Evaluation 10/16/21    Authorization Type [employer] Medicare    Progress Note Due on Visit 29    PT Start Time 1447    PT Stop Time 7616    PT Time Calculation (min) 43 min    Activity Tolerance Patient tolerated treatment well    Behavior During Therapy [facility] for tasks assessed/performed             Past Medical History:  Diagnosis Date   Allergic rhinitis    Breast cancer ([place]) 10/17/2020   CAD (coronary artery disease)    , s/p CAGB 01/2011- LIMA to LAD patent, all other vein grafts occluded. Circumflex DES 3/13 placed following lateral ischemia on nuclear stress test   Cervical spondylosis    , bulging  discs   Disc herniation    L4-L5, L5-S1   Family history of breast cancer 11/18/2020   Hypertension    Mixed hyperlipidemia    Obesity    Post concussion syndrome    processes information slowly,    Postmenopausal    Prediabetes     Past Surgical History:  Procedure Laterality Date   ABDOMINAL HYSTERECTOMY     ANTERIOR CERVICAL DECOMP/DISCECTOMY FUSION N/A 04/23/2021   Procedure: Anterior Cervical Discectomy Fusion - Cervical Four- Cervical Five - Cervical Five -Cervical Six;  Surgeon: Kary Kos, MD;  Location: [place];  Service: Neurosurgery;  Laterality: N/A;  Anterior Cervical Discectomy Fusion - Cervical Four- Cervical Five - Cervical Five -Cervical Six   Bone Spur Left    Foot   BREAST BIOPSY     BREAST ENHANCEMENT SURGERY     BREAST IMPLANT EXCHANGE     replaced rupture implant   BREAST LUMPECTOMY WITH RADIOACTIVE SEED  LOCALIZATION Right 12/11/2020   Procedure: RIGHT BREAST LUMPECTOMY WITH RADIOACTIVE SEED LOCALIZATION;  Surgeon: Jovita Kussmaul, MD;  Location: [place];  Service: General;  Laterality: Right;   CARDIAC CATHETERIZATION     COLONOSCOPY     CORONARY ARTERY BYPASS GRAFT     JOINT REPLACEMENT     right total knee replacement   Left Bunionectomy     LEFT HEART CATHETERIZATION WITH CORONARY/GRAFT ANGIOGRAM  02/05/2012   Procedure: LEFT HEART CATHETERIZATION WITH Beatrix Fetters;  Surgeon: Candee Furbish, MD;  Location: [facility] CATH LAB;  Service: Cardiovascular;;   MASTECTOMY W/ SENTINEL NODE BIOPSY Right 02/19/2021   Procedure: RIGHT MASTECTOMY;  Surgeon: Jovita Kussmaul, MD;  Location: [place];  Service: General;  Laterality: Right;  RNFA, [place]   PARTIAL HYSTERECTOMY     RE-EXCISION OF BREAST CANCER,SUPERIOR MARGINS Right 01/15/2021   Procedure: RE-EXCISION RIGHT BREAST MEDIAL MARGIN;  Surgeon: Jovita Kussmaul, MD;  Location: [place];  Service: General;  Laterality: Right;   REPLACEMENT TOTAL KNEE Right    SENTINEL NODE BIOPSY Right 02/19/2021   Procedure: SENTINEL LYMPH NODE BIOPSY;  Surgeon: Jovita Kussmaul, MD;  Location: [place];  Service: General;  Laterality: Right;   TONSILLECTOMY     TUBAL LIGATION  Bilateral     There were no vitals filed for this visit.   Subjective Assessment - 09/08/21 1452     Subjective Pt reports that she has been doing more laundry and household activities.    Pertinent History CAD s/p CABG in 2012, breast CA with R mastectomy 2022, HTN, HLD, L bunionectomy, post-concussion syndrome, R TKA    Diagnostic tests none recent    Patient Stated Goals improve balance    Currently in Pain? Yes    Pain Score 1     Pain Location Hip    Pain Orientation Right    Pain Descriptors / Indicators Sore    Pain Type Acute pain                               [facility] Adult PT Treatment/Exercise - 09/08/21 0001       Lumbar  Exercises: Aerobic   Nustep L4x31min      Lumbar Exercises: Standing   Other Standing Lumbar Exercises golfers lift 10x each leg      Knee/Hip Exercises: Standing   Other Standing Knee Exercises B marching with UE support 10 reps with 2#                 Balance Exercises - 09/08/21 0001       Balance Exercises: Standing   Marching Solid surface;Upper extremity assist 1;Dynamic;Static;10 reps   with opposite arm raise; progressed to marching walk along the counter   Other Standing Exercises trunk rotations reaching with ball 12 reps    Other Standing Exercises Comments church pews 20x                  PT Short Term Goals - 06/25/21 1449       PT SHORT TERM GOAL #1   Title Patient to be independent with initial HEP.    Time 3    Period Weeks    Status Achieved    Target Date 07/15/21               PT Long Term Goals - 09/04/21 1324       PT LONG TERM GOAL #1   Title Patient to be independent with advanced HEP.    Time 6    Period Weeks    Status On-going   09/04/21- doing neck exercises regularly, butvague about other exercises.   Target Date 10/16/21      PT LONG TERM GOAL #2   Title Patient to demonstrate B UE and LE strength >/=4+/5.    Time 6    Period Weeks    Status Achieved   08/25/21- 4+/5 bil hip flexor strength     PT LONG TERM GOAL #3   Title Patient to demonstrate cervical AROM WFL and without pain limiting.    Time 6    Period Weeks    Status Achieved   09/04/21- 60L, 58 R rotation, 40 extension, 38 flexion, no pain   Target Date 09/08/21      PT LONG TERM GOAL #4   Title Patient to report 70% improvement in LBP.    Time 6    Period Weeks    Status Achieved   no low back pain     PT LONG TERM GOAL #5   Title Pt will improve balance as demonstrated by 45/56 on berg to decrease fall risk    Baseline 36/56    Time 6  Period Weeks    Status On-going   31/56   Target Date 10/16/21      PT LONG TERM GOAL #6   Title Pt. will  demonstrate improved functional strength by complete 5x STS <40 seconds.    Baseline 47 sec with UE support    Time 6    Period Weeks    Status On-going   continues to need UE support and increased time.   Target Date 10/16/21                   Plan - 09/08/21 1535     Clinical Impression Statement Pt had a good response to treatment today but continues to require instruction to allow for normal body movement with less muscle guarding. She required increased time today with the exercises, showing slow/cautious and requiring lots of instruction for proper form with exercises. Extra time also required for encouragement with the higher level exercises. Close assistance needed with balance exercises for max safety and cues for upright posture to facilitate greater balance. She would continue to benefit on more dynamic movements and balance exercises to reduce her risk of falls.    Personal Factors and Comorbidities Age;Comorbidity 3+;Fitness;Past/Current Experience;Time since onset of injury/illness/exacerbation    Comorbidities CAD s/p CABG in 2012, breast CA with R mastectomy 2022, HTN, HLD, L bunionectomy, post-concussion syndrome, R TKA    PT Frequency 2x / week    PT Duration 6 weeks    PT Treatment/Interventions ADLs/Self Care Home Management;Cryotherapy;Electrical Stimulation;Iontophoresis 4mg /ml Dexamethasone;Moist Heat;Balance training;Therapeutic exercise;Therapeutic activities;Functional mobility training;Stair training;Gait training;Ultrasound;Neuromuscular re-education;Patient/family education;Manual techniques;Taping;Energy conservation;Dry needling;Passive range of motion    PT Next Visit Plan continue incorporate more dynamic movement with trunk rotation, gait training with cane    PT Home Exercise Plan Access Code: [email or web address]    Consulted and Agree with Plan of Care Patient             Patient will benefit from skilled therapeutic intervention in order to improve the  following deficits and impairments:  Abnormal gait, Hypomobility, Decreased activity tolerance, Decreased strength, Increased fascial restricitons, Pain, Impaired UE functional use, Decreased balance, Decreased mobility, Difficulty walking, Increased muscle spasms, Improper body mechanics, Decreased range of motion, Postural dysfunction, Impaired flexibility  Visit Diagnosis: Other symptoms and signs involving the musculoskeletal system  Chronic bilateral low back pain without sciatica  Unsteadiness on feet  Cervicalgia     Problem List Patient Active Problem List   Diagnosis Date Noted   Paroxysmal atrial fibrillation ([place]) 05/15/2021   Secondary hypercoagulable state ([place]) 05/15/2021   Myelopathy ([place]) 04/23/2021   Genetic testing 12/04/2020   Family history of breast cancer 11/18/2020   Erythrocytosis 11/14/2020   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  [place], [place], [address] Phone: [phone]   Fax:  [phone]  Name: Rosary Filosa MRN: [id number] Date of Birth: August 20, 1946

## 2021-09-11 ENCOUNTER — Other Ambulatory Visit: Payer: Self-pay | Admitting: Cardiology

## 2021-09-12 ENCOUNTER — Other Ambulatory Visit: Payer: Self-pay

## 2021-09-12 ENCOUNTER — Ambulatory Visit: Payer: Medicare HMO

## 2021-09-12 DIAGNOSIS — R2681 Unsteadiness on feet: Secondary | ICD-10-CM

## 2021-09-12 DIAGNOSIS — G8929 Other chronic pain: Secondary | ICD-10-CM

## 2021-09-12 DIAGNOSIS — M545 Low back pain, unspecified: Secondary | ICD-10-CM | POA: Diagnosis not present

## 2021-09-12 DIAGNOSIS — R29898 Other symptoms and signs involving the musculoskeletal system: Secondary | ICD-10-CM

## 2021-09-12 DIAGNOSIS — M542 Cervicalgia: Secondary | ICD-10-CM

## 2021-09-12 NOTE — Therapy (Signed)
[facility] [address]  [place], [place], [address] Phone: [phone]   Fax:  [phone]  Physical Therapy Treatment  Patient Details  Name: [name] MRN: [id number] Date of Birth: August 08, 1946 Referring Provider (PT): Kary Kos, MD   Encounter Date: 09/12/2021   PT End of Session - 09/12/21 1019     Visit Number 22    Number of Visits 32    Date for PT Re-Evaluation 10/16/21    Authorization Type [employer]    Progress Note Due on Visit 92    PT Start Time 0935    PT Stop Time 1012    PT Time Calculation (min) 37 min    Activity Tolerance Patient tolerated treatment well    Behavior During Therapy [facility] for tasks assessed/performed             Past Medical History:  Diagnosis Date   Allergic rhinitis    Breast cancer ([place]) 10/17/2020   CAD (coronary artery disease)    , s/p CAGB 01/2011- LIMA to LAD patent, all other vein grafts occluded. Circumflex DES 3/13 placed following lateral ischemia on nuclear stress test   Cervical spondylosis    , bulging  discs   Disc herniation    L4-L5, L5-S1   Family history of breast cancer 11/18/2020   Hypertension    Mixed hyperlipidemia    Obesity    Post concussion syndrome    processes information slowly,    Postmenopausal    Prediabetes     Past Surgical History:  Procedure Laterality Date   ABDOMINAL HYSTERECTOMY     ANTERIOR CERVICAL DECOMP/DISCECTOMY FUSION N/A 04/23/2021   Procedure: Anterior Cervical Discectomy Fusion - Cervical Four- Cervical Five - Cervical Five -Cervical Six;  Surgeon: Kary Kos, MD;  Location: [place];  Service: Neurosurgery;  Laterality: N/A;  Anterior Cervical Discectomy Fusion - Cervical Four- Cervical Five - Cervical Five -Cervical Six   Bone Spur Left    Foot   BREAST BIOPSY     BREAST ENHANCEMENT SURGERY     BREAST IMPLANT EXCHANGE     replaced rupture implant   BREAST LUMPECTOMY WITH RADIOACTIVE SEED  LOCALIZATION Right 12/11/2020   Procedure: RIGHT BREAST LUMPECTOMY WITH RADIOACTIVE SEED LOCALIZATION;  Surgeon: Jovita Kussmaul, MD;  Location: [place];  Service: General;  Laterality: Right;   CARDIAC CATHETERIZATION     COLONOSCOPY     CORONARY ARTERY BYPASS GRAFT     JOINT REPLACEMENT     right total knee replacement   Left Bunionectomy     LEFT HEART CATHETERIZATION WITH CORONARY/GRAFT ANGIOGRAM  02/05/2012   Procedure: LEFT HEART CATHETERIZATION WITH Beatrix Fetters;  Surgeon: Candee Furbish, MD;  Location[facility] C[facility] LAB;  Service: Cardiovascular;;   MASTECTOMY W/ SENTINEL NODE BIOPSY Right 02/19/2021   Procedure: RIGHT MASTECTOMY;  Surgeon: Jovita Kussmaul, MD;  Location: [place];  Service: General;  Laterality: Right;  RNFA, PEC BLOCK   PARTIAL HYSTERECTOMY     RE-EXCISION OF BREAST CANCER,SUPERIOR MARGINS Right 01/15/2021   Procedure: RE-EXCISION RIGHT BREAST MEDIAL MARGIN;  Surgeon: Jovita Kussmaul, MD;  Location: [place];  Service: General;  Laterality: Right;   REPLACEMENT TOTAL KNEE Right    SENTINEL NODE BIOPSY Right 02/19/2021   Procedure: SENTINEL LYMPH NODE BIOPSY;  Surgeon: Jovita Kussmaul, MD;  Location: [place];  Service: General;  Laterality: Right;   TONSILLECTOMY     TUBAL LIGATION  Bilateral     There were no vitals filed for this visit.   Subjective Assessment - 09/12/21 0941     Subjective Pt reports that her R hip pain has been bothering her more, been using biofreeze and ibuprofen to help with pain.    Pertinent History CAD s/p CABG in 2012, breast CA with R mastectomy 2022, HTN, HLD, L bunionectomy, post-concussion syndrome, R TKA    Diagnostic tests none recent    Patient Stated Goals improve balance    Currently in Pain? Yes    Pain Score 6     Pain Location Hip    Pain Orientation Right    Pain Descriptors / Indicators Sore    Pain Type Acute pain                               [facility] Adult PT  Treatment/Exercise - 09/12/21 0001       Lumbar Exercises: Stretches   Standing Side Bend Right    Standing Side Bend Limitations 10 reps with counter support    Piriformis Stretch Right;30 seconds    Piriformis Stretch Limitations supine    Figure 4 Stretch 30 seconds;Supine;With overpressure    Gastroc Stretch Right;2 reps;20 seconds    Gastroc Stretch Limitations runners stretch      Lumbar Exercises: Aerobic   Nustep L6x12min      Manual Therapy   Manual Therapy Soft tissue mobilization    Soft tissue mobilization STM to R glutes, piriformis, ITB                       PT Short Term Goals - 06/25/21 1449       PT SHORT TERM GOAL #1   Title Patient to be independent with initial HEP.    Time 3    Period Weeks    Status Achieved    Target Date 07/15/21               PT Long Term Goals - 09/04/21 1324       PT LONG TERM GOAL #1   Title Patient to be independent with advanced HEP.    Time 6    Period Weeks    Status On-going   09/04/21- doing neck exercises regularly, butvague about other exercises.   Target Date 10/16/21      PT LONG TERM GOAL #2   Title Patient to demonstrate B UE and LE strength >/=4+/5.    Time 6    Period Weeks    Status Achieved   08/25/21- 4+/5 bil hip flexor strength     PT LONG TERM GOAL #3   Title Patient to demonstrate cervical AROM WFL and without pain limiting.    Time 6    Period Weeks    Status Achieved   09/04/21- 60L, 58 R rotation, 40 extension, 38 flexion, no pain   Target Date 09/08/21      PT LONG TERM GOAL #4   Title Patient to report 70% improvement in LBP.    Time 6    Period Weeks    Status Achieved   no low back pain     PT LONG TERM GOAL #5   Title Pt will improve balance as demonstrated by 45/56 on berg to decrease fall risk    Baseline 36/56    Time 6    Period Weeks    Status On-going  31/56   Target Date 10/16/21      PT LONG TERM GOAL #6   Title Pt. will demonstrate improved  functional strength by complete 5x STS <40 seconds.    Baseline 47 sec with UE support    Time 6    Period Weeks    Status On-going   continues to need UE support and increased time.   Target Date 10/16/21                   Plan - 09/12/21 1232     Clinical Impression Statement Pt still has c/o R lateral hip pain but eased with ibuprofen and bio freeze. Did STM to her R lateral hip and glutes, finding some tender points but pt reported much improvement after manual. Added piriformis stretches to help with R hip flexibility to reduce strain on her pelvic and hip joint. Educated her on the importance of hip flexibility to improve mobility with gait and ADLs. Pt had no complaints with the stretches today, may need more soft tissue work.    Personal Factors and Comorbidities Age;Comorbidity 3+;Fitness;Past/Current Experience;Time since onset of injury/illness/exacerbation    Comorbidities CAD s/p CABG in 2012, breast CA with R mastectomy 2022, HTN, HLD, L bunionectomy, post-concussion syndrome, R TKA    PT Frequency 2x / week    PT Duration 6 weeks    PT Treatment/Interventions ADLs/Self Care Home Management;Cryotherapy;Electrical Stimulation;Iontophoresis 4mg /ml Dexamethasone;Moist Heat;Balance training;Therapeutic exercise;Therapeutic activities;Functional mobility training;Stair training;Gait training;Ultrasound;Neuromuscular re-education;Patient/family education;Manual techniques;Taping;Energy conservation;Dry needling;Passive range of motion    PT Next Visit Plan STM to R lateral hip if pain persist; continue incorporate more dynamic movement with trunk rotation, gait training with cane    PT Home Exercise Plan Access Code: [email or web address]    Consulted and Agree with Plan of Care Patient             Patient will benefit from skilled therapeutic intervention in order to improve the following deficits and impairments:  Abnormal gait, Hypomobility, Decreased activity tolerance, Decreased  strength, Increased fascial restricitons, Pain, Impaired UE functional use, Decreased balance, Decreased mobility, Difficulty walking, Increased muscle spasms, Improper body mechanics, Decreased range of motion, Postural dysfunction, Impaired flexibility  Visit Diagnosis: Other symptoms and signs involving the musculoskeletal system  Chronic bilateral low back pain without sciatica  Unsteadiness on feet  Cervicalgia     Problem List Patient Active Problem List   Diagnosis Date Noted   Paroxysmal atrial fibrillation ([place]) 05/15/2021   Secondary hypercoagulable state ([place]) 05/15/2021   Myelopathy ([place]) 04/23/2021   Genetic testing 12/04/2020   Family history of breast cancer 11/18/2020   Erythrocytosis 11/14/2020   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

## 2021-09-17 ENCOUNTER — Ambulatory Visit: Payer: Medicare HMO

## 2021-09-17 ENCOUNTER — Other Ambulatory Visit: Payer: Self-pay

## 2021-09-17 DIAGNOSIS — M542 Cervicalgia: Secondary | ICD-10-CM | POA: Diagnosis not present

## 2021-09-17 DIAGNOSIS — R29898 Other symptoms and signs involving the musculoskeletal system: Secondary | ICD-10-CM

## 2021-09-17 DIAGNOSIS — M545 Low back pain, unspecified: Secondary | ICD-10-CM

## 2021-09-17 DIAGNOSIS — R2681 Unsteadiness on feet: Secondary | ICD-10-CM

## 2021-09-17 DIAGNOSIS — G8929 Other chronic pain: Secondary | ICD-10-CM | POA: Diagnosis not present

## 2021-09-17 NOTE — Therapy (Signed)
[facility] [address]  [place], [place], [address] Phone: [phone]   Fax:  [phone]  Physical Therapy Treatment  Patient Details  Name: [name] MRN: [id number] Date of Birth: Sep 13, 1946 Referring Provider (PT): Kary Kos, MD   Encounter Date: 09/17/2021   PT End of Session - 09/17/21 1545     Visit Number 23    Number of Visits 32    Date for PT Re-Evaluation 10/16/21    Authorization Type [employer]na Medicare    Progress Note Due on Visit 62    PT Start Time 1448    PT Stop Time 1530    PT Time Calculation (min) 42 min    Activity Tolerance Patient tolerated treatment well    Behavior During Therapy [facility] for tasks assessed/performed             Past Medical History:  Diagnosis Date   Allergic rhinitis    Breast cancer ([place]) 10/17/2020   CAD (coronary artery disease)    , s/p CAGB 01/2011- LIMA to LAD patent, all other vein grafts occluded. Circumflex DES 3/13 placed following lateral ischemia on nuclear stress test   Cervical spondylosis    , bulging  discs   Disc herniation    L4-L5, L5-S1   Family history of breast cancer 11/18/2020   Hypertension    Mixed hyperlipidemia    Obesity    Post concussion syndrome    processes information slowly,    Postmenopausal    Prediabetes     Past Surgical History:  Procedure Laterality Date   ABDOMINAL HYSTERECTOMY     ANTERIOR CERVICAL DECOMP/DISCECTOMY FUSION N/A 04/23/2021   Procedure: Anterior Cervical Discectomy Fusion - Cervical Four- Cervical Five - Cervical Five -Cervical Six;  Surgeon: Kary Kos, MD;  Location: [place];  Service: Neurosurgery;  Laterality: N/A;  Anterior Cervical Discectomy Fusion - Cervical Four- Cervical Five - Cervical Five -Cervical Six   Bone Spur Left    Foot   BREAST BIOPSY     BREAST ENHANCEMENT SURGERY     BREAST IMPLANT EXCHANGE     replaced rupture implant   BREAST LUMPECTOMY WITH RADIOACTIVE SEED  LOCALIZATION Right 12/11/2020   Procedure: RIGHT BREAST LUMPECTOMY WITH RADIOACTIVE SEED LOCALIZATION;  Surgeon: Jovita Kussmaul, MD;  Location: [facility];  Service: General;  Laterality: Right;   CARDIAC CATHETERIZATION     COLONOSCOPY     CORONARY ARTERY BYPASS GRAFT     JOINT REPLACEMENT     right total knee replacement   Left Bunionectomy     LEFT HEART CATHETERIZATION WITH CORONARY/GRAFT ANGIOGRAM  02/05/2012   Procedure: LEFT HEART CATHETERIZATION WITH Beatrix Fetters;  Surgeon: Candee Furbish, MD;  Location: [facility] C[facility] LAB;  Service: Cardiovascular;;   MASTECTOMY W/ SENTINEL NODE BIOPSY Right 02/19/2021   Procedure: RIGHT MASTECTOMY;  Surgeon: Jovita Kussmaul, MD;  Location: [place];  Service: General;  Laterality: Right;  RNFA, PEC BLOCK   PARTIAL HYSTERECTOMY     RE-EXCISION OF BREAST CANCER,SUPERIOR MARGINS Right 01/15/2021   Procedure: RE-EXCISION RIGHT BREAST MEDIAL MARGIN;  Surgeon: Jovita Kussmaul, MD;  Location: [place];  Service: General;  Laterality: Right;   REPLACEMENT TOTAL KNEE Right    SENTINEL NODE BIOPSY Right 02/19/2021   Procedure: SENTINEL LYMPH NODE BIOPSY;  Surgeon: Jovita Kussmaul, MD;  Location: [place];  Service: General;  Laterality: Right;   TONSILLECTOMY     TUBAL LIGATION  Bilateral     There were no vitals filed for this visit.   Subjective Assessment - 09/17/21 1454     Subjective Pt notes that her hip has gotten better but still hurting.    Pertinent History CAD s/p CABG in 2012, breast CA with R mastectomy 2022, HTN, HLD, L bunionectomy, post-concussion syndrome, R TKA    Diagnostic tests none recent    Patient Stated Goals improve balance    Currently in Pain? Yes    Pain Score 6     Pain Location Hip    Pain Orientation Right    Pain Descriptors / Indicators Sore    Pain Type Acute pain                               [facility] Adult PT Treatment/Exercise - 09/17/21 0001       Lumbar Exercises:  Stretches   ITB Stretch Right;2 reps   15 seconds   ITB Stretch Limitations in Ober test position; manual    Piriformis Stretch Right;2 reps;30 seconds    Piriformis Stretch Limitations supine    Figure 4 Stretch 2 reps;30 seconds;Supine;With overpressure    Figure 4 Stretch Limitations Right      Lumbar Exercises: Aerobic   Nustep L6x55min      Lumbar Exercises: Supine   Bent Knee Raise 10 reps;3 seconds    Bent Knee Raise Limitations red TB    Bridge 15 reps    Bridge Limitations SL on orange pball      Knee/Hip Exercises: Supine   Hip Adduction Isometric Strengthening;Both;20 reps    Hip Adduction Isometric Limitations 5 second holds      Manual Therapy   Manual Therapy Soft tissue mobilization    Soft tissue mobilization STM to R glutes, piriformis, ITB                       PT Short Term Goals - 06/25/21 1449       PT SHORT TERM GOAL #1   Title Patient to be independent with initial HEP.    Time 3    Period Weeks    Status Achieved    Target Date 07/15/21               PT Long Term Goals - 09/04/21 1324       PT LONG TERM GOAL #1   Title Patient to be independent with advanced HEP.    Time 6    Period Weeks    Status On-going   09/04/21- doing neck exercises regularly, butvague about other exercises.   Target Date 10/16/21      PT LONG TERM GOAL #2   Title Patient to demonstrate B UE and LE strength >/=4+/5.    Time 6    Period Weeks    Status Achieved   08/25/21- 4+/5 bil hip flexor strength     PT LONG TERM GOAL #3   Title Patient to demonstrate cervical AROM WFL and without pain limiting.    Time 6    Period Weeks    Status Achieved   09/04/21- 60L, 58 R rotation, 40 extension, 38 flexion, no pain   Target Date 09/08/21      PT LONG TERM GOAL #4   Title Patient to report 70% improvement in LBP.    Time 6    Period Weeks    Status Achieved  no low back pain     PT LONG TERM GOAL #5   Title Pt will improve balance as  demonstrated by 45/56 on berg to decrease fall risk    Baseline 36/56    Time 6    Period Weeks    Status On-going   31/56   Target Date 10/16/21      PT LONG TERM GOAL #6   Title Pt. will demonstrate improved functional strength by complete 5x STS <40 seconds.    Baseline 47 sec with UE support    Time 6    Period Weeks    Status On-going   continues to need UE support and increased time.   Target Date 10/16/21                   Plan - 09/17/21 1546     Clinical Impression Statement Pt still presents with R lateral hip pain. More ttp found on the distal portion of the ITB. Followed massage with manual ITB stretching and piriformis stretching, she still has a lot of piriformis and lateral hip tightness in FABER position which could be contributing to her hip pain. Pt still shows slow and cautious movements with exercises so increased time required for performance. Educated her on the importance of piriformis stretches from last visit and compliance with HEP to maximize results.    Personal Factors and Comorbidities Age;Comorbidity 3+;Fitness;Past/Current Experience;Time since onset of injury/illness/exacerbation    Comorbidities CAD s/p CABG in 2012, breast CA with R mastectomy 2022, HTN, HLD, L bunionectomy, post-concussion syndrome, R TKA    PT Frequency 2x / week    PT Duration 6 weeks    PT Treatment/Interventions ADLs/Self Care Home Management;Cryotherapy;Electrical Stimulation;Iontophoresis 4mg /ml Dexamethasone;Moist Heat;Balance training;Therapeutic exercise;Therapeutic activities;Functional mobility training;Stair training;Gait training;Ultrasound;Neuromuscular re-education;Patient/family education;Manual techniques;Taping;Energy conservation;Dry needling;Passive range of motion    PT Next Visit Plan STM to R lateral hip if pain persist; piriformis stretching/ ITB stretching; continue incorporate more dynamic movement with trunk rotation, gait training with cane    PT Home  Exercise Plan Access Code: [email or web address], Access Code: [email or web address] (10/7)    Consulted and Agree with Plan of Care Patient             Patient will benefit from skilled therapeutic intervention in order to improve the following deficits and impairments:  Abnormal gait, Hypomobility, Decreased activity tolerance, Decreased strength, Increased fascial restricitons, Pain, Impaired UE functional use, Decreased balance, Decreased mobility, Difficulty walking, Increased muscle spasms, Improper body mechanics, Decreased range of motion, Postural dysfunction, Impaired flexibility  Visit Diagnosis: Other symptoms and signs involving the musculoskeletal system  Chronic bilateral low back pain without sciatica  Unsteadiness on feet  Cervicalgia     Problem List Patient Active Problem List   Diagnosis Date Noted   Paroxysmal atrial fibrillation ([place]) 05/15/2021   Secondary hypercoagulable state ([place]) 05/15/2021   Myelopathy ([place]) 04/23/2021   Genetic testing 12/04/2020   Family history of breast cancer 11/18/2020   Erythrocytosis 11/14/2020   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

## 2021-09-23 ENCOUNTER — Other Ambulatory Visit: Payer: Self-pay

## 2021-09-23 ENCOUNTER — Ambulatory Visit: Payer: Medicare HMO

## 2021-09-23 DIAGNOSIS — R2681 Unsteadiness on feet: Secondary | ICD-10-CM | POA: Diagnosis not present

## 2021-09-23 DIAGNOSIS — G8929 Other chronic pain: Secondary | ICD-10-CM

## 2021-09-23 DIAGNOSIS — R29898 Other symptoms and signs involving the musculoskeletal system: Secondary | ICD-10-CM

## 2021-09-23 DIAGNOSIS — M542 Cervicalgia: Secondary | ICD-10-CM

## 2021-09-23 DIAGNOSIS — M545 Low back pain, unspecified: Secondary | ICD-10-CM | POA: Diagnosis not present

## 2021-09-23 NOTE — Therapy (Signed)
[facility] [address]  [place], [place], [address] Phone: [phone]   Fax:  [phone]  Physical Therapy Treatment  Patient Details  Name: [name] MRN: [id number] Date of Birth: [birth year] Referring Provider (PT): Kary Kos, MD   Encounter Date: 09/23/2021   PT End of Session - 09/23/21 1200     Visit Number 24    Number of Visits 32    Date for PT Re-Evaluation 10/16/21    Authorization Type [employer]    Progress Note Due on Visit 15    PT Start Time 1103    PT Stop Time 1145    PT Time Calculation (min) 42 min    Activity Tolerance Patient tolerated treatment well    Behavior During Therapy [facility] for tasks assessed/performed             Past Medical History:  Diagnosis Date   Allergic rhinitis    Breast cancer ([place]) 10/17/2020   CAD (coronary artery disease)    , s/p CAGB 01/2011- LIMA to LAD patent, all other vein grafts occluded. Circumflex DES 3/13 placed following lateral ischemia on nuclear stress test   Cervical spondylosis    , bulging  discs   Disc herniation    L4-L5, L5-S1   Family history of breast cancer 11/18/2020   Hypertension    Mixed hyperlipidemia    Obesity    Post concussion syndrome    processes information slowly,    Postmenopausal    Prediabetes     Past Surgical History:  Procedure Laterality Date   ABDOMINAL HYSTERECTOMY     ANTERIOR CERVICAL DECOMP/DISCECTOMY FUSION N/A 04/23/2021   Procedure: Anterior Cervical Discectomy Fusion - Cervical Four- Cervical Five - Cervical Five -Cervical Six;  Surgeon: Kary Kos, MD;  Location: [place];  Service: Neurosurgery;  Laterality: N/A;  Anterior Cervical Discectomy Fusion - Cervical Four- Cervical Five - Cervical Five -Cervical Six   Bone Spur Left    Foot   BREAST BIOPSY     BREAST ENHANCEMENT SURGERY     BREAST IMPLANT EXCHANGE     replaced rupture implant   BREAST LUMPECTOMY WITH RADIOACTIVE SEED  LOCALIZATION Right 12/11/2020   Procedure: RIGHT BREAST LUMPECTOMY WITH RADIOACTIVE SEED LOCALIZATION;  Surgeon: Jovita Kussmaul, MD;  Location: [place];  Service: General;  Laterality: Right;   CARDIAC CATHETERIZATION     COLONOSCOPY     CORONARY ARTERY BYPASS GRAFT     JOINT REPLACEMENT     right total knee replacement   Left Bunionectomy     LEFT HEART CATHETERIZATION WITH CORONARY/GRAFT ANGIOGRAM  02/05/2012   Procedure: LEFT HEART CATHETERIZATION WITH Beatrix Fetters;  Surgeon: Candee Furbish, MD;  Location: [facility] CATH LAB;  Service: Cardiovascular;;   MASTECTOMY W/ SENTINEL NODE BIOPSY Right 02/19/2021   Procedure: RIGHT MASTECTOMY;  Surgeon: Jovita Kussmaul, MD;  Location: [place];  Service: General;  Laterality: Right;  RNFA, PEC BLOCK   PARTIAL HYSTERECTOMY     RE-EXCISION OF BREAST CANCER,SUPERIOR MARGINS Right 01/15/2021   Procedure: RE-EXCISION RIGHT BREAST MEDIAL MARGIN;  Surgeon: Jovita Kussmaul, MD;  Location: [place];  Service: General;  Laterality: Right;   REPLACEMENT TOTAL KNEE Right    SENTINEL NODE BIOPSY Right 02/19/2021   Procedure: SENTINEL LYMPH NODE BIOPSY;  Surgeon: Jovita Kussmaul, MD;  Location: [place];  Service: General;  Laterality: Right;   TONSILLECTOMY     TUBAL LIGATION  Bilateral     There were no vitals filed for this visit.   Subjective Assessment - 09/23/21 1115     Subjective Pt notes improvements with her R hip today.    Pertinent History CAD s/p CABG in 2012, breast CA with R mastectomy 2022, HTN, HLD, L bunionectomy, post-concussion syndrome, R TKA    Diagnostic tests none recent    Patient Stated Goals improve balance    Currently in Pain? No/denies                               [facility] Adult PT Treatment/Exercise - 09/23/21 0001       Lumbar Exercises: Stretches   Piriformis Stretch Right;2 reps;30 seconds    Piriformis Stretch Limitations seated      Lumbar Exercises: Aerobic   UBE (Upper  Arm Bike) L1.0 3 min    Recumbent Bike L1x61min      Lumbar Exercises: Standing   Other Standing Lumbar Exercises L SLS with opposite arm raise 10x and RW support      Knee/Hip Exercises: Standing   Other Standing Knee Exercises high knees with RW support 2x10                 Balance Exercises - 09/23/21 0001       Balance Exercises: Standing   Standing Eyes Opened Foam/compliant surface;2 reps;20 secs    Standing Eyes Closed Foam/compliant surface;3 reps;10 secs    Tandem Stance Eyes open;Upper extremity support 2;2 reps;20 secs    Other Standing Exercises trunk rotations reaching with 2# and RW support 10 reps                  PT Short Term Goals - 06/25/21 1449       PT SHORT TERM GOAL #1   Title Patient to be independent with initial HEP.    Time 3    Period Weeks    Status Achieved    Target Date 07/15/21               PT Long Term Goals - 09/23/21 1121       PT LONG TERM GOAL #1   Title Patient to be independent with advanced HEP.    Time 6    Period Weeks    Status On-going   09/04/21- doing neck exercises regularly, butvague about other exercises.     PT LONG TERM GOAL #2   Title Patient to demonstrate B UE and LE strength >/=4+/5.    Time 6    Period Weeks    Status Achieved   08/25/21- 4+/5 bil hip flexor strength     PT LONG TERM GOAL #3   Title Patient to demonstrate cervical AROM WFL and without pain limiting.    Time 6    Period Weeks    Status Achieved   09/04/21- 60L, 58 R rotation, 40 extension, 38 flexion, no pain     PT LONG TERM GOAL #4   Title Patient to report 70% improvement in LBP.    Time 6    Period Weeks    Status Achieved   no low back pain     PT LONG TERM GOAL #5   Title Pt will improve balance as demonstrated by 45/56 on berg to decrease fall risk    Baseline 36/56    Time 6    Period Weeks    Status On-going  31/56     PT LONG TERM GOAL #6   Title Pt. will demonstrate improved functional strength by  complete 5x STS <40 seconds.    Baseline 47 sec with UE support    Time 6    Period Weeks    Status On-going   41 sec with UE support                  Plan - 09/23/21 1200     Clinical Impression Statement Pt showed slight improvements with her 5x STS but still completed longer than 40 sec. She showed some difficulties with the static balance exercises today and required RW for UE support. She still is showing guarded movements with ambulation and with STS transfers. Her R hip has decreased with pain and reinforced the importance of glute stretches to improve hip flexibility and decrease pain. Pt responded well.    Personal Factors and Comorbidities Age;Comorbidity 3+;Fitness;Past/Current Experience;Time since onset of injury/illness/exacerbation    Comorbidities CAD s/p CABG in 2012, breast CA with R mastectomy 2022, HTN, HLD, L bunionectomy, post-concussion syndrome, R TKA    PT Frequency 2x / week    PT Duration 6 weeks    PT Treatment/Interventions ADLs/Self Care Home Management;Cryotherapy;Electrical Stimulation;Iontophoresis 4mg /ml Dexamethasone;Moist Heat;Balance training;Therapeutic exercise;Therapeutic activities;Functional mobility training;Stair training;Gait training;Ultrasound;Neuromuscular re-education;Patient/family education;Manual techniques;Taping;Energy conservation;Dry needling;Passive range of motion    PT Next Visit Plan glutes stretches, static balance exercises; continue incorporate more dynamic movement with trunk rotation, gait training with cane    PT Home Exercise Plan Access Code: [email or web address], Access Code: [email or web address] (10/7)    Consulted and Agree with Plan of Care Patient             Patient will benefit from skilled therapeutic intervention in order to improve the following deficits and impairments:  Abnormal gait, Hypomobility, Decreased activity tolerance, Decreased strength, Increased fascial restricitons, Pain, Impaired UE functional use, Decreased  balance, Decreased mobility, Difficulty walking, Increased muscle spasms, Improper body mechanics, Decreased range of motion, Postural dysfunction, Impaired flexibility  Visit Diagnosis: Other symptoms and signs involving the musculoskeletal system  Chronic bilateral low back pain without sciatica  Unsteadiness on feet  Cervicalgia     Problem List Patient Active Problem List   Diagnosis Date Noted   Paroxysmal atrial fibrillation ([place]) 05/15/2021   Secondary hypercoagulable state ([place]) 05/15/2021   Myelopathy ([place]) 04/23/2021   Genetic testing 12/04/2020   Family history of breast cancer 11/18/2020   Erythrocytosis 11/14/2020   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  [place], [place], [address] Phone: [phone]   Fax:  [phone]  Name: [name] MRN: [id number] Date of Birth: 1946-12-07

## 2021-09-25 ENCOUNTER — Ambulatory Visit: Payer: Medicare HMO

## 2021-09-25 ENCOUNTER — Other Ambulatory Visit: Payer: Self-pay

## 2021-09-25 DIAGNOSIS — R2681 Unsteadiness on feet: Secondary | ICD-10-CM

## 2021-09-25 DIAGNOSIS — G8929 Other chronic pain: Secondary | ICD-10-CM | POA: Diagnosis not present

## 2021-09-25 DIAGNOSIS — M545 Low back pain, unspecified: Secondary | ICD-10-CM | POA: Diagnosis not present

## 2021-09-25 DIAGNOSIS — M542 Cervicalgia: Secondary | ICD-10-CM | POA: Diagnosis not present

## 2021-09-25 DIAGNOSIS — R29898 Other symptoms and signs involving the musculoskeletal system: Secondary | ICD-10-CM | POA: Diagnosis not present

## 2021-09-25 NOTE — Therapy (Signed)
[facility] [address]  [place], [place], [address] Phone: [phone]   Fax:  [phone]  Physical Therapy Treatment  Patient Details  Name: [name] MRN: [id number] Date of Birth: 1946-03-11 Referring Provider (PT): Kary Kos, MD   Encounter Date: 09/25/2021   PT End of Session - 09/25/21 1444     Visit Number 25    Number of Visits 32    Date for PT Re-Evaluation 10/16/21    Authorization Type [employer] Medicare    Progress Note Due on Visit 68    PT Start Time 1400    PT Stop Time 1445    PT Time Calculation (min) 45 min    Activity Tolerance Patient tolerated treatment well    Behavior During Therapy [facility] for tasks assessed/performed             Past Medical History:  Diagnosis Date   Allergic rhinitis    Breast cancer ([place]) 10/17/2020   CAD (coronary artery disease)    , s/p CAGB 01/2011- LIMA to LAD patent, all other vein grafts occluded. Circumflex DES 3/13 placed following lateral ischemia on nuclear stress test   Cervical spondylosis    , bulging  discs   Disc herniation    L4-L5, L5-S1   Family history of breast cancer 11/18/2020   Hypertension    Mixed hyperlipidemia    Obesity    Post concussion syndrome    processes information slowly,    Postmenopausal    Prediabetes     Past Surgical History:  Procedure Laterality Date   ABDOMINAL HYSTERECTOMY     ANTERIOR CERVICAL DECOMP/DISCECTOMY FUSION N/A 04/23/2021   Procedure: Anterior Cervical Discectomy Fusion - Cervical Four- Cervical Five - Cervical Five -Cervical Six;  Surgeon: Kary Kos, MD;  Location: [place];  Service: Neurosurgery;  Laterality: N/A;  Anterior Cervical Discectomy Fusion - Cervical Four- Cervical Five - Cervical Five -Cervical Six   Bone Spur Left    Foot   BREAST BIOPSY     BREAST ENHANCEMENT SURGERY     BREAST IMPLANT EXCHANGE     replaced rupture implant   BREAST LUMPECTOMY WITH RADIOACTIVE SEED  LOCALIZATION Right 12/11/2020   Procedure: RIGHT BREAST LUMPECTOMY WITH RADIOACTIVE SEED LOCALIZATION;  Surgeon: Jovita Kussmaul, MD;  Location: [place];  Service: General;  Laterality: Right;   CARDIAC CATHETERIZATION     COLONOSCOPY     CORONARY ARTERY BYPASS GRAFT     JOINT REPLACEMENT     right total knee replacement   Left Bunionectomy     LEFT HEART CATHETERIZATION WITH CORONARY/GRAFT ANGIOGRAM  02/05/2012   Procedure: LEFT HEART CATHETERIZATION WITH Beatrix Fetters;  Surgeon: Candee Furbish, MD;  Location: [facility] CATH LAB;  Service: Cardiovascular;;   MASTECTOMY W/ SENTINEL NODE BIOPSY Right 02/19/2021   Procedure: RIGHT MASTECTOMY;  Surgeon: Jovita Kussmaul, MD;  Location: [place];  Service: General;  Laterality: Right;  RNFA, PEC BLOCK   PARTIAL HYSTERECTOMY     RE-EXCISION OF BREAST CANCER,SUPERIOR MARGINS Right 01/15/2021   Procedure: RE-EXCISION RIGHT BREAST MEDIAL MARGIN;  Surgeon: Jovita Kussmaul, MD;  Location: [place];  Service: General;  Laterality: Right;   REPLACEMENT TOTAL KNEE Right    SENTINEL NODE BIOPSY Right 02/19/2021   Procedure: SENTINEL LYMPH NODE BIOPSY;  Surgeon: Jovita Kussmaul, MD;  Location: [place];  Service: General;  Laterality: Right;   TONSILLECTOMY     TUBAL LIGATION  Bilateral     There were no vitals filed for this visit.   Subjective Assessment - 09/25/21 1404     Subjective Pt reports that she was doing so well after last session, bent over earlier today and hurt her back a little but better from pain meds and biofreeze.    Pertinent History CAD s/p CABG in 2012, breast CA with R mastectomy 2022, HTN, HLD, L bunionectomy, post-concussion syndrome, R TKA    Diagnostic tests none recent    Patient Stated Goals improve balance    Currently in Pain? No/denies                               [facility] Adult PT Treatment/Exercise - 09/25/21 0001       Lumbar Exercises: Stretches   Active Hamstring Stretch  Right;Left;30 seconds    Active Hamstring Stretch Limitations seated    Pelvic Tilt 10 reps    Pelvic Tilt Limitations seated; cues for proper motion    Piriformis Stretch Right;2 reps;30 seconds    Piriformis Stretch Limitations seated    Other Lumbar Stretch Exercise upper trunk rotations 10x seated      Lumbar Exercises: Aerobic   Nustep L6x20min      Lumbar Exercises: Standing   Other Standing Lumbar Exercises B marches with reciprocal arm swing 10x      Lumbar Exercises: Seated   Other Seated Lumbar Exercises horizontal abduction 10 reps in chair      Knee/Hip Exercises: Standing   Other Standing Knee Exercises alt toe taps onto 9' stool 10x                 Balance Exercises - 09/25/21 0001       Balance Exercises: Standing   Standing Eyes Closed Foam/compliant surface;3 reps;10 secs    Tandem Stance Eyes open;30 secs;Upper extremity support 1                  PT Short Term Goals - 06/25/21 1449       PT SHORT TERM GOAL #1   Title Patient to be independent with initial HEP.    Time 3    Period Weeks    Status Achieved    Target Date 07/15/21               PT Long Term Goals - 09/23/21 1121       PT LONG TERM GOAL #1   Title Patient to be independent with advanced HEP.    Time 6    Period Weeks    Status On-going   09/04/21- doing neck exercises regularly, butvague about other exercises.     PT LONG TERM GOAL #2   Title Patient to demonstrate B UE and LE strength >/=4+/5.    Time 6    Period Weeks    Status Achieved   08/25/21- 4+/5 bil hip flexor strength     PT LONG TERM GOAL #3   Title Patient to demonstrate cervical AROM WFL and without pain limiting.    Time 6    Period Weeks    Status Achieved   09/04/21- 60L, 58 R rotation, 40 extension, 38 flexion, no pain     PT LONG TERM GOAL #4   Title Patient to report 70% improvement in LBP.    Time 6    Period Weeks    Status Achieved   no low back pain  PT LONG TERM GOAL #5    Title Pt will improve balance as demonstrated by 45/56 on berg to decrease fall risk    Baseline 36/56    Time 6    Period Weeks    Status On-going   31/56     PT LONG TERM GOAL #6   Title Pt. will demonstrate improved functional strength by complete 5x STS <40 seconds.    Baseline 47 sec with UE support    Time 6    Period Weeks    Status On-going   41 sec with UE support                  Plan - 09/25/21 1447     Clinical Impression Statement Pt came into clinic demonstratiing increased trunk movement with less guarded movements today. She has mild-mod tight hamstrings and still very tight piriformis. She needs cues for upright posture during the exercises and proper WS. She has difficulty with static standing balance using airex and requires close SBA for safety. Otherwise pt responded well with no complaints post session.    Personal Factors and Comorbidities Age;Comorbidity 3+;Fitness;Past/Current Experience;Time since onset of injury/illness/exacerbation    Comorbidities CAD s/p CABG in 2012, breast CA with R mastectomy 2022, HTN, HLD, L bunionectomy, post-concussion syndrome, R TKA    PT Frequency 2x / week    PT Duration 6 weeks    PT Treatment/Interventions ADLs/Self Care Home Management;Cryotherapy;Electrical Stimulation;Iontophoresis 4mg /ml Dexamethasone;Moist Heat;Balance training;Therapeutic exercise;Therapeutic activities;Functional mobility training;Stair training;Gait training;Ultrasound;Neuromuscular re-education;Patient/family education;Manual techniques;Taping;Energy conservation;Dry needling;Passive range of motion    PT Next Visit Plan glutes stretches, static balance exercises; continue incorporate more dynamic movement with trunk rotation, gait training with cane    PT Home Exercise Plan Access Code: [email or web address], Access Code: [email or web address] (10/7)    Consulted and Agree with Plan of Care Patient             Patient will benefit from skilled therapeutic  intervention in order to improve the following deficits and impairments:  Abnormal gait, Hypomobility, Decreased activity tolerance, Decreased strength, Increased fascial restricitons, Pain, Impaired UE functional use, Decreased balance, Decreased mobility, Difficulty walking, Increased muscle spasms, Improper body mechanics, Decreased range of motion, Postural dysfunction, Impaired flexibility  Visit Diagnosis: Other symptoms and signs involving the musculoskeletal system  Chronic bilateral low back pain without sciatica  Unsteadiness on feet  Cervicalgia     Problem List Patient Active Problem List   Diagnosis Date Noted   Paroxysmal atrial fibrillation ([place]) 05/15/2021   Secondary hypercoagulable state ([place]) 05/15/2021   Myelopathy (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  [place], [place], [address] Phone: [phone]   Fax:  [phone]  Name: [name] MRN: [id number] Date of Birth: 1946-05-23

## 2021-09-30 DIAGNOSIS — I10 Essential (primary) hypertension: Secondary | ICD-10-CM | POA: Diagnosis not present

## 2021-09-30 DIAGNOSIS — Z6826 Body mass index (BMI) 26.0-26.9, adult: Secondary | ICD-10-CM | POA: Diagnosis not present

## 2021-09-30 DIAGNOSIS — G959 Disease of spinal cord, unspecified: Secondary | ICD-10-CM | POA: Diagnosis not present

## 2021-10-01 ENCOUNTER — Ambulatory Visit: Payer: Medicare HMO

## 2021-10-01 ENCOUNTER — Other Ambulatory Visit: Payer: Self-pay

## 2021-10-01 DIAGNOSIS — G8929 Other chronic pain: Secondary | ICD-10-CM | POA: Diagnosis not present

## 2021-10-01 DIAGNOSIS — R2681 Unsteadiness on feet: Secondary | ICD-10-CM | POA: Diagnosis not present

## 2021-10-01 DIAGNOSIS — R29898 Other symptoms and signs involving the musculoskeletal system: Secondary | ICD-10-CM

## 2021-10-01 DIAGNOSIS — M542 Cervicalgia: Secondary | ICD-10-CM | POA: Diagnosis not present

## 2021-10-01 DIAGNOSIS — M545 Low back pain, unspecified: Secondary | ICD-10-CM | POA: Diagnosis not present

## 2021-10-01 NOTE — Therapy (Signed)
[facility] [address]  [place], [place], [address] Phone: [phone]   Fax:  [phone]  Physical Therapy Treatment  Patient Details  Name: [name] MRN: [id number] Date of Birth: 02-May-1946 Referring Provider (PT): Kary Kos, MD   Encounter Date: 10/01/2021   PT End of Session - 10/01/21 1446     Visit Number 26    Number of Visits 32    Date for PT Re-Evaluation 10/16/21    Authorization Type [employer]    Progress Note Due on Visit 53    PT Start Time 1316    PT Stop Time 6195    PT Time Calculation (min) 42 min    Activity Tolerance Patient tolerated treatment well    Behavior During Therapy [facility] for tasks assessed/performed             Past Medical History:  Diagnosis Date   Allergic rhinitis    Breast cancer ([place]) 10/17/2020   CAD (coronary artery disease)    , s/p CAGB 01/2011- LIMA to LAD patent, all other vein grafts occluded. Circumflex DES 3/13 placed following lateral ischemia on nuclear stress test   Cervical spondylosis    , bulging  discs   Disc herniation    L4-L5, L5-S1   Family history of breast cancer 11/18/2020   Hypertension    Mixed hyperlipidemia    Obesity    Post concussion syndrome    processes information slowly,    Postmenopausal    Prediabetes     Past Surgical History:  Procedure Laterality Date   ABDOMINAL HYSTERECTOMY     ANTERIOR CERVICAL DECOMP/DISCECTOMY FUSION N/A 04/23/2021   Procedure: Anterior Cervical Discectomy Fusion - Cervical Four- Cervical Five - Cervical Five -Cervical Six;  Surgeon: Kary Kos, MD;  Location: [facility];  Service: Neurosurgery;  Laterality: N/A;  Anterior Cervical Discectomy Fusion - Cervical Four- Cervical Five - Cervical Five -Cervical Six   Bone Spur Left    Foot   BREAST BIOPSY     BREAST ENHANCEMENT SURGERY     BREAST IMPLANT EXCHANGE     replaced rupture implant   BREAST LUMPECTOMY WITH RADIOACTIVE SEED  LOCALIZATION Right 12/11/2020   Procedure: RIGHT BREAST LUMPECTOMY WITH RADIOACTIVE SEED LOCALIZATION;  Surgeon: Jovita Kussmaul, MD;  Location: [place];  Service: General;  Laterality: Right;   CARDIAC CATHETERIZATION     COLONOSCOPY     CORONARY ARTERY BYPASS GRAFT     JOINT REPLACEMENT     right total knee replacement   Left Bunionectomy     LEFT HEART CATHETERIZATION WITH CORONARY/GRAFT ANGIOGRAM  02/05/2012   Procedure: LEFT HEART CATHETERIZATION WITH Beatrix Fetters;  Surgeon: Candee Furbish, MD;  Location: [facility] CATH LAB;  Service: Cardiovascular;;   MASTECTOMY W/ SENTINEL NODE BIOPSY Right 02/19/2021   Procedure: RIGHT MASTECTOMY;  Surgeon: Jovita Kussmaul, MD;  Location: [facility];  Service: General;  Laterality: Right;  RNFA, PEC BLOCK   PARTIAL HYSTERECTOMY     RE-EXCISION OF BREAST CANCER,SUPERIOR MARGINS Right 01/15/2021   Procedure: RE-EXCISION RIGHT BREAST MEDIAL MARGIN;  Surgeon: Jovita Kussmaul, MD;  Location: [place];  Service: General;  Laterality: Right;   REPLACEMENT TOTAL KNEE Right    SENTINEL NODE BIOPSY Right 02/19/2021   Procedure: SENTINEL LYMPH NODE BIOPSY;  Surgeon: Jovita Kussmaul, MD;  Location: [place];  Service: General;  Laterality: Right;   TONSILLECTOMY     TUBAL LIGATION  Bilateral     There were no vitals filed for this visit.   Subjective Assessment - 10/01/21 1319     Subjective Pt reports that everything went well with her Neurosurgeon yesterday, he is pleased with her progress.    Pertinent History CAD s/p CABG in 2012, breast CA with R mastectomy 2022, HTN, HLD, L bunionectomy, post-concussion syndrome, R TKA    Diagnostic tests none recent    Patient Stated Goals improve balance    Currently in Pain? No/denies                               [facility] Adult PT Treatment/Exercise - 10/01/21 0001       Lumbar Exercises: Aerobic   Nustep L7x37min      Knee/Hip Exercises: Standing   Forward Lunges  Right;Left;2 seconds;15 reps    Forward Lunges Limitations lunges into bosu ball for propriceptive feedback    Side Lunges Right;Left;2 seconds;15 reps    Side Lunges Limitations lunges into bosu ball for propriceptive feedback    Hip Extension Stengthening;Both;10 reps;Knee straight    Extension Limitations red TB    Lateral Step Up Right;Left;10 reps;Hand Hold: 2;Step Height: 6"   10 reps each   Forward Step Up Both;2 sets;10 reps;Hand Hold: 2;Step Height: 6"                 Balance Exercises - 10/01/21 0001       Balance Exercises: Standing   Tandem Gait Forward;Upper extremity support   4x along the counter   Other Standing Exercises side steps along the counter 5x; very guarded, cues to increase step length                  PT Short Term Goals - 06/25/21 1449       PT SHORT TERM GOAL #1   Title Patient to be independent with initial HEP.    Time 3    Period Weeks    Status Achieved    Target Date 07/15/21               PT Long Term Goals - 09/23/21 1121       PT LONG TERM GOAL #1   Title Patient to be independent with advanced HEP.    Time 6    Period Weeks    Status On-going   09/04/21- doing neck exercises regularly, butvague about other exercises.     PT LONG TERM GOAL #2   Title Patient to demonstrate B UE and LE strength >/=4+/5.    Time 6    Period Weeks    Status Achieved   08/25/21- 4+/5 bil hip flexor strength     PT LONG TERM GOAL #3   Title Patient to demonstrate cervical AROM WFL and without pain limiting.    Time 6    Period Weeks    Status Achieved   09/04/21- 60L, 58 R rotation, 40 extension, 38 flexion, no pain     PT LONG TERM GOAL #4   Title Patient to report 70% improvement in LBP.    Time 6    Period Weeks    Status Achieved   no low back pain     PT LONG TERM GOAL #5   Title Pt will improve balance as demonstrated by 45/56 on berg to decrease fall risk    Baseline 36/56    Time 6    Period  Weeks    Status  On-going   31/56     PT LONG TERM GOAL #6   Title Pt. will demonstrate improved functional strength by complete 5x STS <40 seconds.    Baseline 47 sec with UE support    Time 6    Period Weeks    Status On-going   41 sec with UE support                  Plan - 10/01/21 1403     Clinical Impression Statement Pt responded well to the treatment today. She noted that she has been able to do laundry now w/o any reports of falls.  Still showing guarded movements especially with side steps today. A little unsteadiness noted with tandem gait. Cues for hip hinge with the lunge exercise to reduce ant knee stress. She needed reminders during session to keep an upright posture to improve stability. She would continue to benefit from more dynamic activites to facilitate more trunk and pelvic mobility during ambulation.    Personal Factors and Comorbidities Age;Comorbidity 3+;Fitness;Past/Current Experience;Time since onset of injury/illness/exacerbation    Comorbidities CAD s/p CABG in 2012, breast CA with R mastectomy 2022, HTN, HLD, L bunionectomy, post-concussion syndrome, R TKA    PT Frequency 2x / week    PT Duration 6 weeks    PT Treatment/Interventions ADLs/Self Care Home Management;Cryotherapy;Electrical Stimulation;Iontophoresis 4mg /ml Dexamethasone;Moist Heat;Balance training;Therapeutic exercise;Therapeutic activities;Functional mobility training;Stair training;Gait training;Ultrasound;Neuromuscular re-education;Patient/family education;Manual techniques;Taping;Energy conservation;Dry needling;Passive range of motion    PT Next Visit Plan glutes stretches, dynamic balance exercises; continue incorporate more dynamic movement with trunk rotation, gait training with cane    PT Home Exercise Plan Access Code: [email or web address], Access Code: [email or web address] (10/7)    Consulted and Agree with Plan of Care Patient             Patient will benefit from skilled therapeutic intervention in order to  improve the following deficits and impairments:  Abnormal gait, Hypomobility, Decreased activity tolerance, Decreased strength, Increased fascial restricitons, Pain, Impaired UE functional use, Decreased balance, Decreased mobility, Difficulty walking, Increased muscle spasms, Improper body mechanics, Decreased range of motion, Postural dysfunction, Impaired flexibility  Visit Diagnosis: Other symptoms and signs involving the musculoskeletal system  Chronic bilateral low back pain without sciatica  Unsteadiness on feet  Cervicalgia     Problem List Patient Active Problem List   Diagnosis Date Noted   Paroxysmal atrial fibrillation ([place]) 05/15/2021   Secondary hypercoagulable state ([place]) 05/15/2021   Myelopathy ([place]) 04/23/2021   Genetic testing 12/04/2020   Family history of breast cancer 11/18/2020   Erythrocytosis 11/14/2020   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  [place], [place], [address] Phone: [phone]   Fax:  [phone]  Name: [name] MRN: [id number] Date of Birth: March 17, 1946

## 2021-10-02 ENCOUNTER — Encounter: Payer: Self-pay | Admitting: Physical Therapy

## 2021-10-02 ENCOUNTER — Ambulatory Visit: Payer: Medicare HMO | Admitting: Physical Therapy

## 2021-10-02 DIAGNOSIS — R29898 Other symptoms and signs involving the musculoskeletal system: Secondary | ICD-10-CM

## 2021-10-02 DIAGNOSIS — M545 Low back pain, unspecified: Secondary | ICD-10-CM | POA: Diagnosis not present

## 2021-10-02 DIAGNOSIS — G8929 Other chronic pain: Secondary | ICD-10-CM

## 2021-10-02 DIAGNOSIS — R2681 Unsteadiness on feet: Secondary | ICD-10-CM

## 2021-10-02 DIAGNOSIS — M542 Cervicalgia: Secondary | ICD-10-CM | POA: Diagnosis not present

## 2021-10-02 NOTE — Therapy (Addendum)
[facility] [address]  [place], [place], [address] Phone: [phone]   Fax:  [phone]  Physical Therapy Treatment  Patient Details  Name: [name] MRN: [id number] Date of Birth: 12-11-1945 Referring Provider (PT): Kary Kos, MD   Encounter Date: 10/02/2021   PT End of Session - 10/02/21 1530     Visit Number 27    Number of Visits 46    Date for PT Re-Evaluation 10/16/21    Authorization Type [employer] Medicare    Progress Note Due on Visit 48    PT Start Time 1530    PT Stop Time 7106    PT Time Calculation (min) 47 min    Activity Tolerance Patient tolerated treatment well    Behavior During Therapy [facility] for tasks assessed/performed             Past Medical History:  Diagnosis Date   Allergic rhinitis    Breast cancer ([place]) 10/17/2020   CAD (coronary artery disease)    , s/p CAGB 01/2011- LIMA to LAD patent, all other vein grafts occluded. Circumflex DES 3/13 placed following lateral ischemia on nuclear stress test   Cervical spondylosis    , bulging  discs   Disc herniation    L4-L5, L5-S1   Family history of breast cancer 11/18/2020   Hypertension    Mixed hyperlipidemia    Obesity    Post concussion syndrome    processes information slowly,    Postmenopausal    Prediabetes     Past Surgical History:  Procedure Laterality Date   ABDOMINAL HYSTERECTOMY     ANTERIOR CERVICAL DECOMP/DISCECTOMY FUSION N/A 04/23/2021   Procedure: Anterior Cervical Discectomy Fusion - Cervical Four- Cervical Five - Cervical Five -Cervical Six;  Surgeon: Kary Kos, MD;  Location: [facility];  Service: Neurosurgery;  Laterality: N/A;  Anterior Cervical Discectomy Fusion - Cervical Four- Cervical Five - Cervical Five -Cervical Six   Bone Spur Left    Foot   BREAST BIOPSY     BREAST ENHANCEMENT SURGERY     BREAST IMPLANT EXCHANGE     replaced rupture implant   BREAST LUMPECTOMY WITH RADIOACTIVE SEED  LOCALIZATION Right 12/11/2020   Procedure: RIGHT BREAST LUMPECTOMY WITH RADIOACTIVE SEED LOCALIZATION;  Surgeon: Jovita Kussmaul, MD;  Location: [place];  Service: General;  Laterality: Right;   CARDIAC CATHETERIZATION     COLONOSCOPY     CORONARY ARTERY BYPASS GRAFT     JOINT REPLACEMENT     right total knee replacement   Left Bunionectomy     LEFT HEART CATHETERIZATION WITH CORONARY/GRAFT ANGIOGRAM  02/05/2012   Procedure: LEFT HEART CATHETERIZATION WITH Beatrix Fetters;  Surgeon: Candee Furbish, MD;  Location: [facility] CATH LAB;  Service: Cardiovascular;;   MASTECTOMY W/ SENTINEL NODE BIOPSY Right 02/19/2021   Procedure: RIGHT MASTECTOMY;  Surgeon: Jovita Kussmaul, MD;  Location: [facility];  Service: General;  Laterality: Right;  RNFA, PEC BLOCK   PARTIAL HYSTERECTOMY     RE-EXCISION OF BREAST CANCER,SUPERIOR MARGINS Right 01/15/2021   Procedure: RE-EXCISION RIGHT BREAST MEDIAL MARGIN;  Surgeon: Jovita Kussmaul, MD;  Location: [facility];  Service: General;  Laterality: Right;   REPLACEMENT TOTAL KNEE Right    SENTINEL NODE BIOPSY Right 02/19/2021   Procedure: SENTINEL LYMPH NODE BIOPSY;  Surgeon: Jovita Kussmaul, MD;  Location: [place];  Service: General;  Laterality: Right;   TONSILLECTOMY     TUBAL LIGATION  Bilateral     There were no vitals filed for this visit.   Subjective Assessment - 10/02/21 1535     Subjective Patient reports she is doing well, but keeps pulling the muscle in her hip.  Its aggravated again today.  She takes 2 tylenol/2 advil and rubs with biofreeze.    Pertinent History CAD s/p CABG in 2012, breast CA with R mastectomy 2022, HTN, HLD, L bunionectomy, post-concussion syndrome, R TKA    Diagnostic tests none recent    Patient Stated Goals improve balance    Currently in Pain? Yes    Pain Score 6     Pain Location Hip    Pain Orientation Right    Pain Descriptors / Indicators Sore    Pain Radiating Towards to her R knee    Pain Onset  Yesterday    Aggravating Factors  bending over                               [facility] Adult PT Treatment/Exercise - 10/02/21 0001       Exercises   Exercises Knee/Hip      Lumbar Exercises: Stretches   Lower Trunk Rotation 5 reps;10 seconds    Lower Trunk Rotation Limitations very slow movements, noted head moving with hips enblock, gentle tactile cues to increase amplitude of movement.    Pelvic Tilt 10 reps      Lumbar Exercises: Aerobic   Nustep L7x52min      Lumbar Exercises: Seated   Other Seated Lumbar Exercises trunk twists 2 x 5 bil, unable to reach hands together    Other Seated Lumbar Exercises reaching to ankles ipsilateral sides and then contralateral - however htis increased R hip pain so discontinued      Lumbar Exercises: Supine   Bridge 10 reps    Bridge Limitations cues to push through toes to avoid HS cramp.    Other Supine Lumbar Exercises bent knee falls outs 2 x 10 bil      Manual Therapy   Manual Therapy Soft tissue mobilization    Manual therapy comments to decrease muscle spasm and pain.    Soft tissue mobilization IASTM to R glutes in L sidelying with foam roller followed by STM to R glutes and QL                     PT Education - 10/02/21 1755     Education Details HEP update    Person(s) Educated Patient    Methods Explanation;Demonstration;Handout;Verbal cues    Comprehension Verbalized understanding;Returned demonstration              PT Short Term Goals - 06/25/21 1449       PT SHORT TERM GOAL #1   Title Patient to be independent with initial HEP.    Time 3    Period Weeks    Status Achieved    Target Date 07/15/21               PT Long Term Goals - 09/23/21 1121       PT LONG TERM GOAL #1   Title Patient to be independent with advanced HEP.    Time 6    Period Weeks    Status On-going   09/04/21- doing neck exercises regularly, butvague about other exercises.     PT LONG TERM GOAL #2    Title Patient to demonstrate B UE  and LE strength >/=4+/5.    Time 6    Period Weeks    Status Achieved   08/25/21- 4+/5 bil hip flexor strength     PT LONG TERM GOAL #3   Title Patient to demonstrate cervical AROM WFL and without pain limiting.    Time 6    Period Weeks    Status Achieved   09/04/21- 60L, 58 R rotation, 40 extension, 38 flexion, no pain     PT LONG TERM GOAL #4   Title Patient to report 70% improvement in LBP.    Time 6    Period Weeks    Status Achieved   no low back pain     PT LONG TERM GOAL #5   Title Pt will improve balance as demonstrated by 45/56 on berg to decrease fall risk    Baseline 36/56    Time 6    Period Weeks    Status On-going   31/56     PT LONG TERM GOAL #6   Title Pt. will demonstrate improved functional strength by complete 5x STS <40 seconds.    Baseline 47 sec with UE support    Time 6    Period Weeks    Status On-going   41 sec with UE support                  Plan - 10/02/21 1629     Clinical Impression Statement Patient reports that bending at home aggravated her R hip again, which is sore and painfull.  Noted increased spasm in R glute, started session with manual therapy to decrease pain to improve participation, the proceeded to core strengthening exercises.  Noted even is supine extremely stiff, rigid movements, with LTR moved entire body including head enblock.  Given HEP today to help decrease stiffness and improve ROM.  In sitting she was unable to rotate trunk/hips enough to turn and bring both hands together.  She would benefit from continueds skilled therapy to continue to improve mobility and improve ambulation.    Personal Factors and Comorbidities Age;Comorbidity 3+;Fitness;Past/Current Experience;Time since onset of injury/illness/exacerbation    Comorbidities CAD s/p CABG in 2012, breast CA with R mastectomy 2022, HTN, HLD, L bunionectomy, post-concussion syndrome, R TKA    PT Frequency 2x / week    PT Duration  6 weeks    PT Treatment/Interventions ADLs/Self Care Home Management;Cryotherapy;Electrical Stimulation;Iontophoresis 4mg /ml Dexamethasone;Moist Heat;Balance training;Therapeutic exercise;Therapeutic activities;Functional mobility training;Stair training;Gait training;Ultrasound;Neuromuscular re-education;Patient/family education;Manual techniques;Taping;Energy conservation;Dry needling;Passive range of motion    PT Next Visit Plan glutes stretches, dynamic balance exercises; continue incorporate more dynamic movement with trunk rotation, gait training with cane    PT Home Exercise Plan Access Code: [email or web address], Access Code: [email or web address] (10/7)    Consulted and Agree with Plan of Care Patient             Patient will benefit from skilled therapeutic intervention in order to improve the following deficits and impairments:  Abnormal gait, Hypomobility, Decreased activity tolerance, Decreased strength, Increased fascial restricitons, Pain, Impaired UE functional use, Decreased balance, Decreased mobility, Difficulty walking, Increased muscle spasms, Improper body mechanics, Decreased range of motion, Postural dysfunction, Impaired flexibility  Visit Diagnosis: Other symptoms and signs involving the musculoskeletal system  Chronic bilateral low back pain without sciatica  Unsteadiness on feet  Cervicalgia     Problem List Patient Active Problem List   Diagnosis Date Noted   Paroxysmal atrial fibrillation ([place]) 05/15/2021   Secondary hypercoagulable state ([place]) 05/15/2021  Myelopathy ([place]) 04/23/2021   Genetic testing 12/04/2020   Family history of breast cancer 11/18/2020   Erythrocytosis 11/14/2020   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

## 2021-10-02 NOTE — Patient Instructions (Signed)
Access Code: [email or web address] URL: [email or web address] Date: 10/02/2021 Prepared by: Glenetta Hew  Exercises Supine Lower Trunk Rotation - 1 x daily - 7 x weekly - 2 sets - 10 reps Supine Posterior Pelvic Tilt - 1 x daily - 7 x weekly - 2 sets - 10 reps Supine Bridge - 1 x daily - 7 x weekly - 2 sets - 10 reps Supine Single Bent Knee Fallout - 1 x daily - 7 x weekly - 2 sets - 10 reps

## 2021-10-06 ENCOUNTER — Ambulatory Visit: Payer: Medicare HMO

## 2021-10-07 ENCOUNTER — Ambulatory Visit: Payer: Medicare HMO | Attending: Neurosurgery | Admitting: Physical Therapy

## 2021-10-07 ENCOUNTER — Other Ambulatory Visit: Payer: Self-pay

## 2021-10-07 ENCOUNTER — Encounter: Payer: Self-pay | Admitting: Physical Therapy

## 2021-10-07 DIAGNOSIS — R2681 Unsteadiness on feet: Secondary | ICD-10-CM | POA: Insufficient documentation

## 2021-10-07 DIAGNOSIS — R29898 Other symptoms and signs involving the musculoskeletal system: Secondary | ICD-10-CM | POA: Diagnosis not present

## 2021-10-07 DIAGNOSIS — M542 Cervicalgia: Secondary | ICD-10-CM | POA: Diagnosis not present

## 2021-10-07 DIAGNOSIS — M545 Low back pain, unspecified: Secondary | ICD-10-CM | POA: Insufficient documentation

## 2021-10-07 DIAGNOSIS — G8929 Other chronic pain: Secondary | ICD-10-CM | POA: Insufficient documentation

## 2021-10-07 NOTE — Therapy (Signed)
[facility] [address]  [place], [place], [address] Phone: [phone]   Fax:  [phone]  Physical Therapy Treatment  Patient Details  Name: [name] MRN: [id number] Date of Birth: 09/21/1946 Referring Provider (PT): Kary Kos, MD   Encounter Date: 10/07/2021   PT End of Session - 10/07/21 0935     Visit Number 28    Number of Visits 32    Date for PT Re-Evaluation 10/16/21    Authorization Type [employer]    Progress Note Due on Visit 9    PT Start Time 0931    PT Stop Time 1011    PT Time Calculation (min) 40 min    Activity Tolerance Patient tolerated treatment well    Behavior During Therapy [facility] for tasks assessed/performed             Past Medical History:  Diagnosis Date   Allergic rhinitis    Breast cancer ([place]) 10/17/2020   CAD (coronary artery disease)    , s/p CAGB 01/2011- LIMA to LAD patent, all other vein grafts occluded. Circumflex DES 3/13 placed following lateral ischemia on nuclear stress test   Cervical spondylosis    , bulging  discs   Disc herniation    L4-L5, L5-S1   Family history of breast cancer 11/18/2020   Hypertension    Mixed hyperlipidemia    Obesity    Post concussion syndrome    processes information slowly,    Postmenopausal    Prediabetes     Past Surgical History:  Procedure Laterality Date   ABDOMINAL HYSTERECTOMY     ANTERIOR CERVICAL DECOMP/DISCECTOMY FUSION N/A 04/23/2021   Procedure: Anterior Cervical Discectomy Fusion - Cervical Four- Cervical Five - Cervical Five -Cervical Six;  Surgeon: Kary Kos, MD;  Location: [facility];  Service: Neurosurgery;  Laterality: N/A;  Anterior Cervical Discectomy Fusion - Cervical Four- Cervical Five - Cervical Five -Cervical Six   Bone Spur Left    Foot   BREAST BIOPSY     BREAST ENHANCEMENT SURGERY     BREAST IMPLANT EXCHANGE     replaced rupture implant   BREAST LUMPECTOMY WITH RADIOACTIVE SEED  LOCALIZATION Right 12/11/2020   Procedure: RIGHT BREAST LUMPECTOMY WITH RADIOACTIVE SEED LOCALIZATION;  Surgeon: Jovita Kussmaul, MD;  Location: [facility];  Service: General;  Laterality: Right;   CARDIAC CATHETERIZATION     COLONOSCOPY     CORONARY ARTERY BYPASS GRAFT     JOINT REPLACEMENT     right total knee replacement   Left Bunionectomy     LEFT HEART CATHETERIZATION WITH CORONARY/GRAFT ANGIOGRAM  02/05/2012   Procedure: LEFT HEART CATHETERIZATION WITH Beatrix Fetters;  Surgeon: Candee Furbish, MD;  Location[facility] CATH LAB;  Service: Cardiovascular;;   MASTECTOMY W/ SENTINEL NODE BIOPSY Right 02/19/2021   Procedure: RIGHT MASTECTOMY;  Surgeon: Jovita Kussmaul, MD;  Location: [facility];  Service: General;  Laterality: Right;  RNFA, PEC BLOCK   PARTIAL HYSTERECTOMY     RE-EXCISION OF BREAST CANCER,SUPERIOR MARGINS Right 01/15/2021   Procedure: RE-EXCISION RIGHT BREAST MEDIAL MARGIN;  Surgeon: Jovita Kussmaul, MD;  Location: [facility];  Service: General;  Laterality: Right;   REPLACEMENT TOTAL KNEE Right    SENTINEL NODE BIOPSY Right 02/19/2021   Procedure: SENTINEL LYMPH NODE BIOPSY;  Surgeon: Jovita Kussmaul, MD;  Location: [facility];  Service: General;  Laterality: Right;   TONSILLECTOMY     TUBAL LIGATION  Bilateral     There were no vitals filed for this visit.   Subjective Assessment - 10/07/21 0934     Subjective Patient reports she is doing much better today, pain in hip has calmed down, had no pain getting into bed last night.    Pertinent History CAD s/p CABG in 2012, breast CA with R mastectomy 2022, HTN, HLD, L bunionectomy, post-concussion syndrome, R TKA    Diagnostic tests none recent    Patient Stated Goals improve balance    Currently in Pain? No/denies    Pain Onset Yesterday                               [facility] Adult PT Treatment/Exercise - 10/07/21 0001       Lumbar Exercises: Aerobic   Nustep L7x17min      Lumbar  Exercises: Seated   Other Seated Lumbar Exercises trunk twists 2 x 10 bil, added boom whackers to encourage twist                 Balance Exercises - 10/07/21 0001       Balance Exercises: Standing   Stepping Strategy Lateral;Limitations    Stepping Strategy Limitations stepping to side reaching for elevated cone x 10 bil with CGA for safety    Step Ups Forward;Limitations    Step Ups Limitations toe taps on step x 10 bil with CGA for safety, mirror and cues for posture.    Sit to Stand Upper extremity support;Limitations    Sit to Stand Limitations 2 x 10 with cues for foward weight shift, hands on thighs, challenging    Other Standing Exercises turning and reaching across body for elevated cones x 10 bil, CGA for safety                  PT Short Term Goals - 06/25/21 1449       PT SHORT TERM GOAL #1   Title Patient to be independent with initial HEP.    Time 3    Period Weeks    Status Achieved    Target Date 07/15/21               PT Long Term Goals - 09/23/21 1121       PT LONG TERM GOAL #1   Title Patient to be independent with advanced HEP.    Time 6    Period Weeks    Status On-going   09/04/21- doing neck exercises regularly, butvague about other exercises.     PT LONG TERM GOAL #2   Title Patient to demonstrate B UE and LE strength >/=4+/5.    Time 6    Period Weeks    Status Achieved   08/25/21- 4+/5 bil hip flexor strength     PT LONG TERM GOAL #3   Title Patient to demonstrate cervical AROM WFL and without pain limiting.    Time 6    Period Weeks    Status Achieved   09/04/21- 60L, 58 R rotation, 40 extension, 38 flexion, no pain     PT LONG TERM GOAL #4   Title Patient to report 70% improvement in LBP.    Time 6    Period Weeks    Status Achieved   no low back pain     PT LONG TERM GOAL #5   Title Pt will improve balance as demonstrated by 45/56 on berg to decrease  fall risk    Baseline 36/56    Time 6    Period Weeks     Status On-going   31/56     PT LONG TERM GOAL #6   Title Pt. will demonstrate improved functional strength by complete 5x STS <40 seconds.    Baseline 47 sec with UE support    Time 6    Period Weeks    Status On-going   41 sec with UE support                  Plan - 10/07/21 1329     Clinical Impression Statement Patient reports hip pain not bothering her today, so focus of skilled interventions was on balance and trunk rotation to improve overall mobility and safety.  She was challenged today with sit to stands without UE support, needing to rock forward 3x to stand.  At end of session noted more trunk mobility in reaching movements.  she would benefit from continued skilled therapy.    Personal Factors and Comorbidities Age;Comorbidity 3+;Fitness;Past/Current Experience;Time since onset of injury/illness/exacerbation    Comorbidities CAD s/p CABG in 2012, breast CA with R mastectomy 2022, HTN, HLD, L bunionectomy, post-concussion syndrome, R TKA    PT Frequency 2x / week    PT Duration 6 weeks    PT Treatment/Interventions ADLs/Self Care Home Management;Cryotherapy;Electrical Stimulation;Iontophoresis 4mg /ml Dexamethasone;Moist Heat;Balance training;Therapeutic exercise;Therapeutic activities;Functional mobility training;Stair training;Gait training;Ultrasound;Neuromuscular re-education;Patient/family education;Manual techniques;Taping;Energy conservation;Dry needling;Passive range of motion    PT Next Visit Plan glutes stretches, dynamic balance exercises; continue incorporate more dynamic movement with trunk rotation, gait training with cane    PT Home Exercise Plan Access Code: [email or web address], Access Code: [email or web address] (10/7)    Consulted and Agree with Plan of Care Patient             Patient will benefit from skilled therapeutic intervention in order to improve the following deficits and impairments:  Abnormal gait, Hypomobility, Decreased activity tolerance, Decreased strength,  Increased fascial restricitons, Pain, Impaired UE functional use, Decreased balance, Decreased mobility, Difficulty walking, Increased muscle spasms, Improper body mechanics, Decreased range of motion, Postural dysfunction, Impaired flexibility  Visit Diagnosis: Other symptoms and signs involving the musculoskeletal system  Chronic bilateral low back pain without sciatica  Unsteadiness on feet  Cervicalgia     Problem List Patient Active Problem List   Diagnosis Date Noted   Paroxysmal atrial fibrillation ([place]) 05/15/2021   Secondary hypercoagulable state ([place]) 05/15/2021   Myelopathy ([place]) 04/23/2021   Genetic testing 12/04/2020   Family history of breast cancer 11/18/2020   Erythrocytosis 11/14/2020   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  [place], [place], [address] Phone: [phone]   Fax:  [phone]  Name: [name] MRN: [id number] Date of Birth: 1946/10/15

## 2021-10-08 ENCOUNTER — Ambulatory Visit: Payer: Medicare HMO | Admitting: Physical Therapy

## 2021-10-08 ENCOUNTER — Other Ambulatory Visit: Payer: Self-pay | Admitting: Cardiology

## 2021-10-08 ENCOUNTER — Encounter: Payer: Self-pay | Admitting: Physical Therapy

## 2021-10-08 DIAGNOSIS — M545 Low back pain, unspecified: Secondary | ICD-10-CM | POA: Diagnosis not present

## 2021-10-08 DIAGNOSIS — G8929 Other chronic pain: Secondary | ICD-10-CM | POA: Diagnosis not present

## 2021-10-08 DIAGNOSIS — M542 Cervicalgia: Secondary | ICD-10-CM | POA: Diagnosis not present

## 2021-10-08 DIAGNOSIS — R2681 Unsteadiness on feet: Secondary | ICD-10-CM | POA: Diagnosis not present

## 2021-10-08 DIAGNOSIS — R29898 Other symptoms and signs involving the musculoskeletal system: Secondary | ICD-10-CM

## 2021-10-08 NOTE — Therapy (Signed)
[facility] [address]  [place], [place], [address] Phone: [phone]   Fax:  [phone]  Physical Therapy Treatment  Patient Details  Name: [name] MRN: [id number] Date of Birth: 10-09-1946 Referring Provider (PT): Kary Kos, MD   Encounter Date: 10/08/2021   PT End of Session - 10/08/21 1411     Visit Number 29    Number of Visits 25    Date for PT Re-Evaluation 10/16/21    Authorization Type [employer] Medicare    Progress Note Due on Visit 50    PT Start Time 1403    PT Stop Time 1448    PT Time Calculation (min) 45 min    Activity Tolerance Patient tolerated treatment well    Behavior During Therapy [facility] for tasks assessed/performed             Past Medical History:  Diagnosis Date   Allergic rhinitis    Breast cancer ([place]) 10/17/2020   CAD (coronary artery disease)    , s/p CAGB 01/2011- LIMA to LAD patent, all other vein grafts occluded. Circumflex DES 3/13 placed following lateral ischemia on nuclear stress test   Cervical spondylosis    , bulging  discs   Disc herniation    L4-L5, L5-S1   Family history of breast cancer 11/18/2020   Hypertension    Mixed hyperlipidemia    Obesity    Post concussion syndrome    processes information slowly,    Postmenopausal    Prediabetes     Past Surgical History:  Procedure Laterality Date   ABDOMINAL HYSTERECTOMY     ANTERIOR CERVICAL DECOMP/DISCECTOMY FUSION N/A 04/23/2021   Procedure: Anterior Cervical Discectomy Fusion - Cervical Four- Cervical Five - Cervical Five -Cervical Six;  Surgeon: Kary Kos, MD;  Location: [facility];  Service: Neurosurgery;  Laterality: N/A;  Anterior Cervical Discectomy Fusion - Cervical Four- Cervical Five - Cervical Five -Cervical Six   Bone Spur Left    Foot   BREAST BIOPSY     BREAST ENHANCEMENT SURGERY     BREAST IMPLANT EXCHANGE     replaced rupture implant   BREAST LUMPECTOMY WITH RADIOACTIVE SEED  LOCALIZATION Right 12/11/2020   Procedure: RIGHT BREAST LUMPECTOMY WITH RADIOACTIVE SEED LOCALIZATION;  Surgeon: Jovita Kussmaul, MD;  Location: [facility];  Service: General;  Laterality: Right;   CARDIAC CATHETERIZATION     COLONOSCOPY     CORONARY ARTERY BYPASS GRAFT     JOINT REPLACEMENT     right total knee replacement   Left Bunionectomy     LEFT HEART CATHETERIZATION WITH CORONARY/GRAFT ANGIOGRAM  02/05/2012   Procedure: LEFT HEART CATHETERIZATION WITH Beatrix Fetters;  Surgeon: Candee Furbish, MD;  Location: [facility] CATH LAB;  Service: Cardiovascular;;   MASTECTOMY W/ SENTINEL NODE BIOPSY Right 02/19/2021   Procedure: RIGHT MASTECTOMY;  Surgeon: Jovita Kussmaul, MD;  Location: [facility];  Service: General;  Laterality: Right;  RNFA, PEC BLOCK   PARTIAL HYSTERECTOMY     RE-EXCISION OF BREAST CANCER,SUPERIOR MARGINS Right 01/15/2021   Procedure: RE-EXCISION RIGHT BREAST MEDIAL MARGIN;  Surgeon: Jovita Kussmaul, MD;  Location: [facility];  Service: General;  Laterality: Right;   REPLACEMENT TOTAL KNEE Right    SENTINEL NODE BIOPSY Right 02/19/2021   Procedure: SENTINEL LYMPH NODE BIOPSY;  Surgeon: Jovita Kussmaul, MD;  Location: [facility];  Service: General;  Laterality: Right;   TONSILLECTOMY     TUBAL LIGATION  Bilateral     There were no vitals filed for this visit.   Subjective Assessment - 10/08/21 1408     Subjective Patient reports she did her HEP this morning.  Cant do bridges in her bed - too soft and both legs cramp.    Pertinent History CAD s/p CABG in 2012, breast CA with R mastectomy 2022, HTN, HLD, L bunionectomy, post-concussion syndrome, R TKA    Diagnostic tests none recent    Patient Stated Goals improve balance    Currently in Pain? No/denies    Pain Onset [facility] Adult PT Treatment/Exercise - 10/08/21 0001       Lumbar Exercises: Aerobic   Nustep L7x56min                  Balance Exercises - 10/08/21 0001       Balance Exercises: Standing   Stepping Strategy Lateral;Limitations;Anterior    Stepping Strategy Limitations stepping to side reaching for elevated cone x 10 bil with CGA for safety, then stepping foward to touch cone x 10 bil with CGA for safety    Step Ups Forward;Limitations    Step Ups Limitations toe taps on step x 10 bil with CGA for safety, mirror and cues for posture.    Sit to Stand Standard surface;Limitations    Sit to Stand Limitations x 10 with reaching foward to grasp ball to encourage anterior weight shift to unweight buttocks.    Other Standing Exercises reaching overhead with ball x 10 in standing      Balance Exercises: Seated   Dynamic Sitting No upper extremity support;Anterior/posterior weight shift;Lateral weight shift;Reaching outside base of support;Limitations    Dynamic Sitting Limitations with boomwhackers starting with reaching across body, progressed to hitting target all focused on crossing midline, but foward, side and back to encourage trunk and neck rotation as well as weight shifts.                PT Education - 10/08/21 1503     Education Details sit to stands at home at kitchen sink, work on leaning foward and standing up faster, but sitting down slower.    Person(s) Educated Patient    Methods Explanation;Demonstration;Verbal cues    Comprehension Verbalized understanding;Returned demonstration              PT Short Term Goals - 06/25/21 1449       PT SHORT TERM GOAL #1   Title Patient to be independent with initial HEP.    Time 3    Period Weeks    Status Achieved    Target Date 07/15/21               PT Long Term Goals - 09/23/21 1121       PT LONG TERM GOAL #1   Title Patient to be independent with advanced HEP.    Time 6    Period Weeks    Status On-going   09/04/21- doing neck exercises regularly, butvague about other exercises.     PT LONG TERM GOAL #2   Title Patient to  demonstrate B UE and LE strength >/=4+/5.    Time 6    Period Weeks    Status Achieved   08/25/21- 4+/5 bil hip flexor strength     PT LONG TERM  GOAL #3   Title Patient to demonstrate cervical AROM WFL and without pain limiting.    Time 6    Period Weeks    Status Achieved   09/04/21- 60L, 58 R rotation, 40 extension, 38 flexion, no pain     PT LONG TERM GOAL #4   Title Patient to report 70% improvement in LBP.    Time 6    Period Weeks    Status Achieved   no low back pain     PT LONG TERM GOAL #5   Title Pt will improve balance as demonstrated by 45/56 on berg to decrease fall risk    Baseline 36/56    Time 6    Period Weeks    Status On-going   31/56     PT LONG TERM GOAL #6   Title Pt. will demonstrate improved functional strength by complete 5x STS <40 seconds.    Baseline 47 sec with UE support    Time 6    Period Weeks    Status On-going   41 sec with UE support                  Plan - 10/08/21 1500     Clinical Impression Statement Continued to focus skilled interventions today on balance and transitions, with sit to stands while reaching for target to improve foward trunk shift, also reaching further outside BOS while in sitting as expressed anxiety with weight shifts and falling out of chair even though well seated, to improve thoracic and hip mobility, and stepping strategy reaching for cones.  Even with reaching foward still frequently rocked forward several times to give herself momentum to stand.  Recommended performing sit to stands at home while leaning foward and holding onto kitchen sink to give her stability and working on increasing speed of reps.  Patient would benefit from continued skilled therapy.    Personal Factors and Comorbidities Age;Comorbidity 3+;Fitness;Past/Current Experience;Time since onset of injury/illness/exacerbation    Comorbidities CAD s/p CABG in 2012, breast CA with R mastectomy 2022, HTN, HLD, L bunionectomy, post-concussion  syndrome, R TKA    PT Frequency 2x / week    PT Duration 6 weeks    PT Treatment/Interventions ADLs/Self Care Home Management;Cryotherapy;Electrical Stimulation;Iontophoresis 4mg /ml Dexamethasone;Moist Heat;Balance training;Therapeutic exercise;Therapeutic activities;Functional mobility training;Stair training;Gait training;Ultrasound;Neuromuscular re-education;Patient/family education;Manual techniques;Taping;Energy conservation;Dry needling;Passive range of motion    PT Next Visit Plan Progress note, dynamic balance exercises; continue incorporate more dynamic movement with trunk rotation, gait training with cane    PT Home Exercise Plan Access Code: [email or web address], Access Code: [email or web address] (10/7)    Consulted and Agree with Plan of Care Patient             Patient will benefit from skilled therapeutic intervention in order to improve the following deficits and impairments:  Abnormal gait, Hypomobility, Decreased activity tolerance, Decreased strength, Increased fascial restricitons, Pain, Impaired UE functional use, Decreased balance, Decreased mobility, Difficulty walking, Increased muscle spasms, Improper body mechanics, Decreased range of motion, Postural dysfunction, Impaired flexibility  Visit Diagnosis: Other symptoms and signs involving the musculoskeletal system  Chronic bilateral low back pain without sciatica  Unsteadiness on feet  Cervicalgia     Problem List Patient Active Problem List   Diagnosis Date Noted   Paroxysmal atrial fibrillation ([place]) 05/15/2021   Secondary hypercoagulable state ([place]) 05/15/2021   Myelopathy ([place]) 04/23/2021   Genetic testing 12/04/2020   Family history of breast cancer 11/18/2020   Erythrocytosis 11/14/2020   Ductal carcinoma  in situ (DCIS) of right breast 11/05/2020   Essential hypertension, benign 10/09/2013   CAD (coronary artery disease)    Hypertension    Mixed hyperlipidemia    Prediabetes    Coronary atherosclerosis of native  coronary artery 02/05/2012    Rennie Natter, PT, DPT 10/08/2021, 3:08 PM  [facility] [address]  [place], [place], [address] Phone: [phone]   Fax:  [phone]  Name: [name] MRN: [id number] Date of Birth: May 31, 1946

## 2021-10-13 ENCOUNTER — Other Ambulatory Visit: Payer: Self-pay

## 2021-10-13 ENCOUNTER — Ambulatory Visit: Payer: Medicare HMO

## 2021-10-13 DIAGNOSIS — R2681 Unsteadiness on feet: Secondary | ICD-10-CM

## 2021-10-13 DIAGNOSIS — M545 Low back pain, unspecified: Secondary | ICD-10-CM | POA: Diagnosis not present

## 2021-10-13 DIAGNOSIS — M542 Cervicalgia: Secondary | ICD-10-CM

## 2021-10-13 DIAGNOSIS — R29898 Other symptoms and signs involving the musculoskeletal system: Secondary | ICD-10-CM | POA: Diagnosis not present

## 2021-10-13 DIAGNOSIS — G8929 Other chronic pain: Secondary | ICD-10-CM | POA: Diagnosis not present

## 2021-10-13 NOTE — Therapy (Addendum)
[name] [address]  [place], [place], [address] Phone: [phone]   Fax:  [phone]  Physical Therapy Treatment/Progress Note  Progress Note Reporting Period 09/04/2021 to 10/13/2021  See note below for Objective Data and Assessment of Progress/Goals.   Patient is making progress towards goals, and her [name] score has improved from 36/56 to 41/56, but she is still at moderate risk for falls and requires use of AD for safety.  She still has very limited cervical/trunk rotation and balance challenges.   Her functional strength has improved as demonstrated by 5xSTS <40 sec, meeting LTG #6, but movements are still extremely slow.  She would benefit from continued skilled therapy to continue to improve balance and functional mobility.    Rennie Natter, PT, DPT  Patient Details  Name: [name] MRN: [id number] Date of Birth: 04-01-1946 Referring Provider (PT): Kary Kos, MD   Encounter Date: 10/13/2021   PT End of Session - 10/13/21 1448     Visit Number 30    Number of Visits 32    Date for PT Re-Evaluation 10/16/21    Authorization Type [employer]    Progress Note Due on Visit 61    PT Start Time 1400    PT Stop Time 1445    PT Time Calculation (min) 45 min    Activity Tolerance Patient tolerated treatment well    Behavior During Therapy [facility] for tasks assessed/performed             Past Medical History:  Diagnosis Date   Allergic rhinitis    Breast cancer ([place]) 10/17/2020   CAD (coronary artery disease)    , s/p CAGB 01/2011- LIMA to LAD patent, all other vein grafts occluded. Circumflex DES 3/13 placed following lateral ischemia on nuclear stress test   Cervical spondylosis    , bulging  discs   Disc herniation    L4-L5, L5-S1   Family history of breast cancer 11/18/2020   Hypertension    Mixed hyperlipidemia    Obesity    Post concussion syndrome    processes information  slowly,    Postmenopausal    Prediabetes     Past Surgical History:  Procedure Laterality Date   ABDOMINAL HYSTERECTOMY     ANTERIOR CERVICAL DECOMP/DISCECTOMY FUSION N/A 04/23/2021   Procedure: Anterior Cervical Discectomy Fusion - Cervical Four- Cervical Five - Cervical Five -Cervical Six;  Surgeon: Kary Kos, MD;  Location: [facility];  Service: Neurosurgery;  Laterality: N/A;  Anterior Cervical Discectomy Fusion - Cervical Four- Cervical Five - Cervical Five -Cervical Six   Bone Spur Left    Foot   BREAST BIOPSY     BREAST ENHANCEMENT SURGERY     BREAST IMPLANT EXCHANGE     replaced rupture implant   BREAST LUMPECTOMY WITH RADIOACTIVE SEED LOCALIZATION Right 12/11/2020   Procedure: RIGHT BREAST LUMPECTOMY WITH RADIOACTIVE SEED LOCALIZATION;  Surgeon: Jovita Kussmaul, MD;  Location: [facility];  Service: General;  Laterality: Right;   CARDIAC CATHETERIZATION     COLONOSCOPY     CORONARY ARTERY BYPASS GRAFT     JOINT REPLACEMENT     right total knee replacement   Left Bunionectomy     LEFT HEART CATHETERIZATION WITH CORONARY/GRAFT ANGIOGRAM  02/05/2012   Procedure: LEFT HEART CATHETERIZATION WITH Beatrix Fetters;  Surgeon: Candee Furbish, MD;  Location[facility];  Service: Cardiovascular;;   MASTECTOMY W/ SENTINEL NODE BIOPSY Right 02/19/2021  Procedure: RIGHT MASTECTOMY;  Surgeon: Jovita Kussmaul, MD;  Location: [facility];  Service: General;  Laterality: Right;  RNFA, PEC BLOCK   PARTIAL HYSTERECTOMY     RE-EXCISION OF BREAST CANCER,SUPERIOR MARGINS Right 01/15/2021   Procedure: RE-EXCISION RIGHT BREAST MEDIAL MARGIN;  Surgeon: Jovita Kussmaul, MD;  Location: [facility];  Service: General;  Laterality: Right;   REPLACEMENT TOTAL KNEE Right    SENTINEL NODE BIOPSY Right 02/19/2021   Procedure: SENTINEL LYMPH NODE BIOPSY;  Surgeon: Jovita Kussmaul, MD;  Location: [facility];  Service: General;  Laterality: Right;   TONSILLECTOMY     TUBAL LIGATION Bilateral      There were no vitals filed for this visit.   Subjective Assessment - 10/13/21 1402     Subjective Pt reports that the Lasix and Atenolol that she takes makes her dizzy at times.    Pertinent History CAD s/p CABG in 2012, breast CA with R mastectomy 2022, HTN, HLD, L bunionectomy, post-concussion syndrome, R TKA    Diagnostic tests none recent    Patient Stated Goals improve balance    Currently in Pain? No/denies                [facility] PT Assessment - 10/13/21 0001       Assessment   Medical Diagnosis Disease of spinal cord    Referring Provider (PT) Kary Kos, MD    Onset Date/Surgical Date 04/23/21      Observation/Other Assessments   Focus on Therapeutic Outcomes (FOTO)  Cervical: 101                           [facility] Adult PT Treatment/Exercise - 10/13/21 0001       Berg Balance Test   Sit to Stand Able to stand  independently using hands    Standing Unsupported Able to stand safely 2 minutes    Sitting with Back Unsupported but Feet Supported on Floor or Stool Able to sit safely and securely 2 minutes    Stand to Sit Controls descent by using hands    Transfers Able to transfer safely, definite need of hands    Standing Unsupported with Eyes Closed Able to stand 10 seconds safely    Standing Ubsupported with Feet Together Able to place feet together independently and stand 1 minute safely    From Standing, Reach Forward with Outstretched Arm Can reach confidently >25 cm (10")    From Standing Position, Pick up Object from Floor Able to pick up shoe, needs supervision    From Standing Position, Turn to Look Behind Over each Shoulder Looks behind one side only/other side shows less weight shift    Turn 360 Degrees Able to turn 360 degrees safely but slowly    Standing Unsupported, Alternately Place Feet on Step/Stool Able to complete >2 steps/needs minimal assist    Standing Unsupported, One Foot in Front Needs help to step but can hold 15 seconds     Standing on One Leg Able to lift leg independently and hold equal to or more than 3 seconds    Total Score 41      Lumbar Exercises: Aerobic   Recumbent Bike [id number]                       PT Short Term Goals - 06/25/21 1449       PT SHORT TERM GOAL #1   Title Patient  to be independent with initial HEP.    Time 3    Period Weeks    Status Achieved    Target Date 07/15/21               PT Long Term Goals - 10/13/21 1420       PT LONG TERM GOAL #1   Title Patient to be independent with advanced HEP.    Time 6    Period Weeks    Status On-going   09/04/21- doing neck exercises regularly, butvague about other exercises.     PT LONG TERM GOAL #2   Title Patient to demonstrate B UE and LE strength >/=4+/5.    Time 6    Period Weeks    Status Achieved   08/25/21- 4+/5 bil hip flexor strength     PT LONG TERM GOAL #3   Title Patient to demonstrate cervical AROM WFL and without pain limiting.    Time 6    Period Weeks    Status Achieved   09/04/21- 60L, 58 R rotation, 40 extension, 38 flexion, no pain     PT LONG TERM GOAL #4   Title Patient to report 70% improvement in LBP.    Time 6    Period Weeks    Status Achieved   no low back pain     PT LONG TERM GOAL #5   Title Pt will improve balance as demonstrated by 45/56 on berg to decrease fall risk    Baseline 36/56    Time 6    Period Weeks    Status On-going   41/56- 10/13/21     PT LONG TERM GOAL #6   Title Pt. will demonstrate improved functional strength by complete 5x STS <40 seconds.    Baseline 47 sec with UE support    Time 6    Period Weeks    Status Achieved   10/13/21: 35.49 sec with UE support                  Plan - 10/13/21 1456     Clinical Impression Statement Pt has met LTG #6 as of today. FOTO score hasn't made substantial improvement since last time taken. BERG balance score has improved to 41/56, which still has her in the high fall risk category. Most trouble noted with  foot clearance on 8' step, reaching down to the floor, head or trunk rotation, tandem stance, and SLS. She has met most LTGs making good progress with PT but still is a high fall risk according to the assessment today, even though she denies any recent falls. In order to prevent any further falls especially when turning her trunk or bending down to reach objects, she would continue to benefit from skilled PT.    Personal Factors and Comorbidities Age;Comorbidity 3+;Fitness;Past/Current Experience;Time since onset of injury/illness/exacerbation    Comorbidities CAD s/p CABG in 2012, breast CA with R mastectomy 2022, HTN, HLD, L bunionectomy, post-concussion syndrome, R TKA    PT Frequency 2x / week    PT Duration 6 weeks    PT Treatment/Interventions ADLs/Self Care Home Management;Cryotherapy;Electrical Stimulation;Iontophoresis 39m/ml Dexamethasone;Moist Heat;Balance training;Therapeutic exercise;Therapeutic activities;Functional mobility training;Stair training;Gait training;Ultrasound;Neuromuscular re-education;Patient/family education;Manual techniques;Taping;Energy conservation;Dry needling;Passive range of motion    PT Next Visit Plan dynamic balance exercises; continue incorporate more dynamic movement with trunk rotation, gait training with cane    PT Home Exercise Plan Access Code: [email or web address], Access Code: [email or web address](10/7)    Consulted and Agree with Plan  of Care Patient             Patient will benefit from skilled therapeutic intervention in order to improve the following deficits and impairments:  Abnormal gait, Hypomobility, Decreased activity tolerance, Decreased strength, Increased fascial restricitons, Pain, Impaired UE functional use, Decreased balance, Decreased mobility, Difficulty walking, Increased muscle spasms, Improper body mechanics, Decreased range of motion, Postural dysfunction, Impaired flexibility  Visit Diagnosis: Other symptoms and signs involving the musculoskeletal  system  Chronic bilateral low back pain without sciatica  Unsteadiness on feet  Cervicalgia     Problem List Patient Active Problem List   Diagnosis Date Noted   Paroxysmal atrial fibrillation ([place]) 05/15/2021   Secondary hypercoagulable state ([place]) 05/15/2021   Myelopathy ([place]) 04/23/2021   Genetic testing 12/04/2020   Family history of breast cancer 11/18/2020   Erythrocytosis 11/14/2020   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

## 2021-10-15 NOTE — Progress Notes (Signed)
NEUROLOGY FOLLOW UP OFFICE NOTE  [name] [id number]  Assessment/Plan:   Cervical myelopathy s/p ACDF  Postconcussion syndrome resolved  1  Continue PT and follow up with neurosurgery 2  Follow up with me as needed  Subjective:  [name] is a 75 year old right-handed female with  Recently diagnosed noninvasive breast cancer who follows up for postconcussion syndrome and myelopathy.  UPDATE: Last seen in December for postconcussion syndrome.  Noted to have UMN signs on exam, such as hyperreflexia, Hoffman and Babinski signs.  MRI of cervical spine with and without contrast on 12/27/2020 personally reviewed and showed cord compression at C4-5 and C5-6.  She was referred to neurosurgery and underwent ACDF at those levels in May.  She is in rehab.  She is doing much better.  They will be working on balance next.  Speech has cleared up.  Cognition improved.  She may need a moment to gather her thoughts of what she wants to say.  Emotional lability has improved.     HISTORY: On 06/30/2020, she tripped and fell in the bathroom at church, hitting her head on the bathroom stall sustaining a scalp laceration.  She was seen in Urgent Care where she and her spouse reported that she did not lose consciousness and denied headache, blurred vision, nausea, vomiting, dizziness or lightheadedness.  When she returned to Urgent Care on 07/10/2020 for suture removal, she still denied headache. However, she has reported lightheadedness and feeling dizzy particularly in changing positions.  She is therefore scared to drive.  She reports problems with memory, having to write things down.  She reports word-finding issues and speech is slurred.  She also has trouble writing.  Handwriting is smaller.  It takes prolonged period of time to write things down.  She is still able to pay the bills but takes a while to complete task.  She also cries easily.  She has been using a cane as she has been unsteady.  There has been no improvement in symptoms but they have not gotten worse either.     MRI of brain with and without contrast on 09/06/2020 showed mild generalized cerebral atrophy and chronic small vessel ischemic changes but no acute intracranial abnormality.   She hit her head after tripping on the driveway in [id number] but no concussion or LOC with that event.  PAST MEDICAL HISTORY: Past Medical History:  Diagnosis Date   Allergic rhinitis    Breast cancer ([place]) 10/17/2020   CAD (coronary artery disease)    , s/p CAGB 01/2011- LIMA to LAD patent, all other vein grafts occluded. Circumflex DES 3/13 placed following lateral ischemia on nuclear stress test   Cervical spondylosis    , bulging  discs   Disc herniation    L4-L5, L5-S1   Family history of breast cancer 11/18/2020   Hypertension    Mixed hyperlipidemia    Obesity    Post concussion syndrome    processes information slowly,    Postmenopausal    Prediabetes     MEDICATIONS: Current Outpatient Medications on File Prior to Visit  Medication Sig Dispense Refill   acetaminophen (TYLENOL) 500 MG tablet Take 500-1,000 mg by mouth every 6 (six) hours as needed (pain).     apixaban (ELIQUIS) 5 MG TABS tablet Take 1 tablet (5 mg total) by mouth 2 (two) times daily. 60 tablet 4   atenolol (TENORMIN) 50 MG tablet Take 1 tablet (50 mg total) by mouth daily. Pt needs  to make appt with provider for additional refills - 1st attempt 90 tablet 0   B Complex-C-E-Zn (EQL STRESS B-COMPLEX C/ZINC) TABS take 1 tablet by oral route every day     cephALEXin (KEFLEX) 500 MG capsule Take 500 mg by mouth 4 (four) times daily. 3x/day for 7 days     COVID-19 mRNA Vac-TriS, Pfizer, SUSP injection Inject into the muscle. 0.3 mL 0   furosemide (LASIX) 20 MG tablet TAKE 1 TABLET BY MOUTH EVERY DAY AS NEEDED (Patient taking differently: Take 20 mg by mouth daily.) 90 tablet 3   ibuprofen (ADVIL) 200 MG tablet 1 tablet with food or milk as needed      ketoconazole (NIZORAL) 2 % cream Apply 1 application topically daily. 15 g 4   Multiple Vitamin (MULTIVITAMIN WITH MINERALS) TABS tablet Take 1 tablet by mouth daily.     Potassium Gluconate 550 MG TABS take 1 tablet by oral route every day     rosuvastatin (CRESTOR) 20 MG tablet Take 1 tablet (20 mg total) by mouth daily. Pt needs to make appt with provider for additional refills - 1st attempt 90 tablet 0   No current facility-administered medications on file prior to visit.    ALLERGIES: Allergies  Allergen Reactions   Norvasc [Amlodipine Besylate] Other (See Comments)    Edema    Latex Itching and Rash   Lipitor [Atorvastatin] Other (See Comments)    Muscle aches   Lisinopril Swelling and Cough   Penicillins Rash    Tolerated Ancef 2g IV on 02/19/21   Sulfa Antibiotics Rash   Tape Itching and Rash   Tetanus Toxoids Other (See Comments)    Patient said "Didn't do well".    FAMILY HISTORY: Family History  Problem Relation Age of Onset   Heart disease Mother    Hypertension Mother    Heart attack Father    Heart disease Brother    Heart disease Brother    Breast cancer Paternal Grandmother        dx unknown age   Breast cancer Paternal Aunt        dx unknown age      Objective:  Blood pressure 128/63, pulse 68, height 5\' 6"  (1.676 m), weight 164 lb 12.8 oz (74.8 kg), SpO2 98 %. General: No acute distress.  Patient appears well-groomed.   Head:  Normocephalic/atraumatic Eyes:  Fundi examined but not visualized Neck: supple, no paraspinal tenderness, full range of motion Heart:  Regular rate and rhythm Lungs:  Clear to auscultation bilaterally Back: No paraspinal tenderness Neurological Exam: alert and oriented to person, place, and time.  Speech fluent and not dysarthric, language intact.  CN II-XII intact. Bulk and tone normal, muscle strength 5/5 throughout, no rigidity.  Sensation to pinprick reduced in left lower extremity, reduced vibratory sensation in feet.   Deep tendon reflexes 3+ throughout, toes downgoing.  Finger to nose testing intact.  Broad-based gait, requiring use of walker.  Unsteady.   Metta Clines, DO  CC: Orpah Melter, MD

## 2021-10-16 ENCOUNTER — Encounter: Payer: Self-pay | Admitting: Neurology

## 2021-10-16 ENCOUNTER — Other Ambulatory Visit: Payer: Self-pay

## 2021-10-16 ENCOUNTER — Ambulatory Visit: Payer: Medicare HMO | Admitting: Neurology

## 2021-10-16 ENCOUNTER — Ambulatory Visit: Payer: Medicare HMO | Admitting: Physical Therapy

## 2021-10-16 VITALS — BP 128/63 | HR 68 | Ht 66.0 in | Wt 164.8 lb

## 2021-10-16 DIAGNOSIS — G8929 Other chronic pain: Secondary | ICD-10-CM | POA: Diagnosis not present

## 2021-10-16 DIAGNOSIS — R29898 Other symptoms and signs involving the musculoskeletal system: Secondary | ICD-10-CM

## 2021-10-16 DIAGNOSIS — M545 Low back pain, unspecified: Secondary | ICD-10-CM

## 2021-10-16 DIAGNOSIS — R2681 Unsteadiness on feet: Secondary | ICD-10-CM

## 2021-10-16 DIAGNOSIS — M542 Cervicalgia: Secondary | ICD-10-CM | POA: Diagnosis not present

## 2021-10-16 DIAGNOSIS — G959 Disease of spinal cord, unspecified: Secondary | ICD-10-CM

## 2021-10-16 DIAGNOSIS — F0781 Postconcussional syndrome: Secondary | ICD-10-CM | POA: Diagnosis not present

## 2021-10-16 DIAGNOSIS — R69 Illness, unspecified: Secondary | ICD-10-CM | POA: Diagnosis not present

## 2021-10-16 NOTE — Patient Instructions (Signed)
Access Code: [email or web address] URL: [email or web address] Date: 10/16/2021 Prepared by: Glenetta Hew  Exercises Supine Lower Trunk Rotation - 1 x daily - 7 x weekly - 2 sets - 10 reps Supine Bridge - 1 x daily - 7 x weekly - 2 sets - 10 reps Sidelying Open Book - 1 x daily - 7 x weekly - 2 sets - 10 reps Clamshell - 1 x daily - 7 x weekly - 2 sets - 10 reps

## 2021-10-16 NOTE — Patient Instructions (Signed)
Follow up as needed

## 2021-10-16 NOTE — Therapy (Signed)
[facility] [address]  [place], [place], [address] Phone: [phone]   Fax:  [phone]  Physical Therapy Treatment  Patient Details  Name: [name] MRN: [id number] Date of Birth: [birth year] Referring Provider (PT): Kary Kos, MD   Encounter Date: 10/16/2021   PT End of Session - 10/16/21 1451     Visit Number 31    Number of Visits 40    Date for PT Re-Evaluation 11/27/21    Authorization Type [employer]    Progress Note Due on Visit 64    PT Start Time 1445    PT Stop Time 1529    PT Time Calculation (min) 44 min    Activity Tolerance Patient tolerated treatment well;Patient limited by pain    Behavior During Therapy [facility] for tasks assessed/performed             Past Medical History:  Diagnosis Date   Allergic rhinitis    Breast cancer ([place]) 10/17/2020   CAD (coronary artery disease)    , s/p CAGB 01/2011- LIMA to LAD patent, all other vein grafts occluded. Circumflex DES 3/13 placed following lateral ischemia on nuclear stress test   Cervical spondylosis    , bulging  discs   Disc herniation    L4-L5, L5-S1   Family history of breast cancer 11/18/2020   Hypertension    Mixed hyperlipidemia    Obesity    Post concussion syndrome    processes information slowly,    Postmenopausal    Prediabetes     Past Surgical History:  Procedure Laterality Date   ABDOMINAL HYSTERECTOMY     ANTERIOR CERVICAL DECOMP/DISCECTOMY FUSION N/A 04/23/2021   Procedure: Anterior Cervical Discectomy Fusion - Cervical Four- Cervical Five - Cervical Five -Cervical Six;  Surgeon: Kary Kos, MD;  Location: [place];  Service: Neurosurgery;  Laterality: N/A;  Anterior Cervical Discectomy Fusion - Cervical Four- Cervical Five - Cervical Five -Cervical Six   Bone Spur Left    Foot   BREAST BIOPSY     BREAST ENHANCEMENT SURGERY     BREAST IMPLANT EXCHANGE     replaced rupture implant   BREAST LUMPECTOMY  WITH RADIOACTIVE SEED LOCALIZATION Right 12/11/2020   Procedure: RIGHT BREAST LUMPECTOMY WITH RADIOACTIVE SEED LOCALIZATION;  Surgeon: Jovita Kussmaul, MD;  Location: [place];  Service: General;  Laterality: Right;   CARDIAC CATHETERIZATION     COLONOSCOPY     CORONARY ARTERY BYPASS GRAFT     JOINT REPLACEMENT     right total knee replacement   Left Bunionectomy     LEFT HEART CATHETERIZATION WITH CORONARY/GRAFT ANGIOGRAM  02/05/2012   Procedure: LEFT HEART CATHETERIZATION WITH Beatrix Fetters;  Surgeon: Candee Furbish, MD;  Location: [facility] CATH LAB;  Service: Cardiovascular;;   MASTECTOMY W/ SENTINEL NODE BIOPSY Right 02/19/2021   Procedure: RIGHT MASTECTOMY;  Surgeon: Jovita Kussmaul, MD;  Location: [place];  Service: General;  Laterality: Right;  RNFA, PEC BLOCK   PARTIAL HYSTERECTOMY     RE-EXCISION OF BREAST CANCER,SUPERIOR MARGINS Right 01/15/2021   Procedure: RE-EXCISION RIGHT BREAST MEDIAL MARGIN;  Surgeon: Jovita Kussmaul, MD;  Location: [place];  Service: General;  Laterality: Right;   REPLACEMENT TOTAL KNEE Right    SENTINEL NODE BIOPSY Right 02/19/2021   Procedure: SENTINEL LYMPH NODE BIOPSY;  Surgeon: Jovita Kussmaul, MD;  Location: [place];  Service: General;  Laterality: Right;   TONSILLECTOMY  TUBAL LIGATION Bilateral     There were no vitals filed for this visit.   Subjective Assessment - 10/16/21 1449     Subjective Patient reports she saw her neurologist this morning, and released.  Recommneded continued PT.    Pertinent History CAD s/p CABG in 2012, breast CA with R mastectomy 2022, HTN, HLD, L bunionectomy, post-concussion syndrome, R TKA    Diagnostic tests none recent    Patient Stated Goals improve balance    Currently in Pain? Yes   put biofreeze on hip and 3 tylenol this morning.   Pain Score 0-No pain    Pain Location Hip    Pain Orientation Right                [facility] PT Assessment - 10/16/21 0001       Assessment    Medical Diagnosis Disease of spinal cord    Referring Provider (PT) Kary Kos, MD    Onset Date/Surgical Date 04/23/21    Next MD Visit 10/16/2021      Precautions   Precautions Fall      Restrictions   Weight Bearing Restrictions No      Observation/Other Assessments   Focus on Therapeutic Outcomes (FOTO)  Cervical: 65                           [facility] Adult PT Treatment/Exercise - 10/16/21 0001       Berg Balance Test   Sit to Stand --    Standing Unsupported --    Sitting with Back Unsupported but Feet Supported on Floor or Stool --    Stand to Sit --    Transfers --    Standing Unsupported with Eyes Closed --    Standing Ubsupported with Feet Together --    From Standing, Reach Forward with Outstretched Arm --    From Standing Position, Pick up Object from Floor --    From Standing Position, Turn to Look Behind Over each Shoulder --    Turn 360 Degrees --    Standing Unsupported, Alternately Place Feet on Step/Stool --    Standing Unsupported, One Foot in Front --    Standing on One Leg --    Total Score --      Neck Exercises: Supine   Other Supine Exercise lower trunk rotation with cues to turn head to contralateral side      Neck Exercises: Sidelying   Other Sidelying Exercise open book with elbow flexed to avoid pulling R pectoralis x 10 with head rotation.      Lumbar Exercises: Stretches   Lower Trunk Rotation 5 reps;20 seconds    Lower Trunk Rotation Limitations with overpressure from PT      Lumbar Exercises: Aerobic   Nustep L7x3min      Lumbar Exercises: Seated   Other Seated Lumbar Exercises trunk twists 2 x 10 bil, reaching all directions x 10 bil    Other Seated Lumbar Exercises foward bend with weighted ball   reported R hip pain with exercise     Lumbar Exercises: Supine   Bridge 10 reps    Bridge Limitations smaller ROM to decrease HS cramping      Lumbar Exercises: Sidelying   Clam Both;10 reps                      PT Education - 10/16/21 1527     Education Details reviewed and  educated on rotational exercises for trunk.    Person(s) Educated Patient    Methods Explanation;Demonstration;Verbal cues;Handout    Comprehension Verbalized understanding;Returned demonstration              PT Short Term Goals - 06/25/21 1449       PT SHORT TERM GOAL #1   Title Patient to be independent with initial HEP.    Time 3    Period Weeks    Status Achieved    Target Date 07/15/21               PT Long Term Goals - 10/16/21 1625       PT LONG TERM GOAL #1   Title Patient to be independent with advanced HEP.    Time 6    Period Weeks    Status On-going   09/04/21- doing neck exercises regularly, but vague about other exercises.  11/10- updated HEP and educated on importance of daily performance.   Target Date 11/27/21      PT LONG TERM GOAL #2   Title Patient to demonstrate B UE and LE strength >/=4+/5.    Time 6    Period Weeks    Status Achieved   08/25/21- 4+/5 bil hip flexor strength     PT LONG TERM GOAL #3   Title Patient to demonstrate cervical AROM WFL and without pain limiting.    Time 6    Period Weeks    Status Achieved   09/04/21- 60L, 58 R rotation, 40 extension, 38 flexion, no pain     PT LONG TERM GOAL #4   Title Patient to report 70% improvement in LBP.    Time 6    Period Weeks    Status Achieved   no low back pain     PT LONG TERM GOAL #5   Title Pt will improve balance as demonstrated by 45/56 on berg to decrease fall risk    Baseline 36/56    Time 6    Period Weeks    Status On-going   10/13/21- 41/56   Target Date 11/27/21      PT LONG TERM GOAL #6   Title Pt. will demonstrate improved functional strength by complete 5x STS <40 seconds.    Baseline 47 sec with UE support    Time 6    Period Weeks    Status Achieved   10/13/21: 35.49 sec with UE support                  Plan - 10/16/21 1453     Clinical Impression Statement  Focus of today's interventions was continuing to improve balance and trunk rotation, however when started working on seated hip mobility she complained of increased hip pain, so transitioned to mat level exercises working on trunk/hip mobility.  She needed a lot of cuing to turn head with open books for thoracic mobility, and with LTR needed cues to turn head other direction to prevent rolling entire body side to side.  She demonstrates extremely poor hip IR/ER bil.  Reviewed and encouraged to perform HEP daily to address tightness in trunk/hips, as this is continuing to affect balance.  She would benefit from continued skilled therapy to continue working on balance and mobility deficits, as she is still at increased risk ofr falls with Berg of 41/56.  Continue for additional 2x/week for 6 weeks.    Personal Factors and Comorbidities Age;Comorbidity 3+;Fitness;Past/Current Experience;Time since onset of injury/illness/exacerbation  Comorbidities CAD s/p CABG in 2012, breast CA with R mastectomy 2022, HTN, HLD, L bunionectomy, post-concussion syndrome, R TKA    PT Frequency 2x / week    PT Duration 6 weeks    PT Treatment/Interventions ADLs/Self Care Home Management;Cryotherapy;Electrical Stimulation;Iontophoresis 4mg /ml Dexamethasone;Moist Heat;Balance training;Therapeutic exercise;Therapeutic activities;Functional mobility training;Stair training;Gait training;Ultrasound;Neuromuscular re-education;Patient/family education;Manual techniques;Taping;Energy conservation;Dry needling;Passive range of motion    PT Next Visit Plan dynamic balance exercises; continue incorporate more dynamic movement with trunk rotation, gait training with cane    PT Home Exercise Plan Access Code: [email or web address], Access Code: [email or web address] (10/7)    Consulted and Agree with Plan of Care Patient             Patient will benefit from skilled therapeutic intervention in order to improve the following deficits and impairments:   Abnormal gait, Hypomobility, Decreased activity tolerance, Decreased strength, Increased fascial restricitons, Pain, Impaired UE functional use, Decreased balance, Decreased mobility, Difficulty walking, Increased muscle spasms, Improper body mechanics, Decreased range of motion, Postural dysfunction, Impaired flexibility  Visit Diagnosis: Other symptoms and signs involving the musculoskeletal system  Chronic bilateral low back pain without sciatica  Unsteadiness on feet     Problem List Patient Active Problem List   Diagnosis Date Noted   Paroxysmal atrial fibrillation ([place]) 05/15/2021   Secondary hypercoagulable state ([place]) 05/15/2021   Myelopathy ([place]) 04/23/2021   Genetic testing 12/04/2020   Family history of breast cancer 11/18/2020   Erythrocytosis 11/14/2020   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

## 2021-10-21 ENCOUNTER — Other Ambulatory Visit: Payer: Self-pay

## 2021-10-21 ENCOUNTER — Ambulatory Visit: Payer: Medicare HMO

## 2021-10-21 DIAGNOSIS — R29898 Other symptoms and signs involving the musculoskeletal system: Secondary | ICD-10-CM

## 2021-10-21 DIAGNOSIS — R2681 Unsteadiness on feet: Secondary | ICD-10-CM | POA: Diagnosis not present

## 2021-10-21 DIAGNOSIS — G8929 Other chronic pain: Secondary | ICD-10-CM | POA: Diagnosis not present

## 2021-10-21 DIAGNOSIS — M542 Cervicalgia: Secondary | ICD-10-CM

## 2021-10-21 DIAGNOSIS — M545 Low back pain, unspecified: Secondary | ICD-10-CM | POA: Diagnosis not present

## 2021-10-21 NOTE — Therapy (Signed)
[facility] [address]  [place], [place], [address] Phone: [phone]   Fax:  [phone]  Physical Therapy Treatment  Patient Details  Name: [name] MRN: [id number] Date of Birth: 1946/02/15 Referring Provider (PT): Kary Kos, MD   Encounter Date: 10/21/2021   PT End of Session - 10/21/21 1602     Visit Number 32    Number of Visits 40    Date for PT Re-Evaluation 11/27/21    Authorization Type [employer] Medicare    Progress Note Due on Visit 74    PT Start Time 1449    PT Stop Time 1532    PT Time Calculation (min) 43 min    Activity Tolerance Patient tolerated treatment well;Patient limited by pain    Behavior During Therapy [facility] for tasks assessed/performed             Past Medical History:  Diagnosis Date   Allergic rhinitis    Breast cancer ([place]) 10/17/2020   CAD (coronary artery disease)    , s/p CAGB 01/2011- LIMA to LAD patent, all other vein grafts occluded. Circumflex DES 3/13 placed following lateral ischemia on nuclear stress test   Cervical spondylosis    , bulging  discs   Disc herniation    L4-L5, L5-S1   Family history of breast cancer 11/18/2020   Hypertension    Mixed hyperlipidemia    Obesity    Post concussion syndrome    processes information slowly,    Postmenopausal    Prediabetes     Past Surgical History:  Procedure Laterality Date   ABDOMINAL HYSTERECTOMY     ANTERIOR CERVICAL DECOMP/DISCECTOMY FUSION N/A 04/23/2021   Procedure: Anterior Cervical Discectomy Fusion - Cervical Four- Cervical Five - Cervical Five -Cervical Six;  Surgeon: Kary Kos, MD;  Location: [place];  Service: Neurosurgery;  Laterality: N/A;  Anterior Cervical Discectomy Fusion - Cervical Four- Cervical Five - Cervical Five -Cervical Six   Bone Spur Left    Foot   BREAST BIOPSY     BREAST ENHANCEMENT SURGERY     BREAST IMPLANT EXCHANGE     replaced rupture implant   BREAST LUMPECTOMY  WITH RADIOACTIVE SEED LOCALIZATION Right 12/11/2020   Procedure: RIGHT BREAST LUMPECTOMY WITH RADIOACTIVE SEED LOCALIZATION;  Surgeon: Jovita Kussmaul, MD;  Location: [place];  Service: General;  Laterality: Right;   CARDIAC CATHETERIZATION     COLONOSCOPY     CORONARY ARTERY BYPASS GRAFT     JOINT REPLACEMENT     right total knee replacement   Left Bunionectomy     LEFT HEART CATHETERIZATION WITH CORONARY/GRAFT ANGIOGRAM  02/05/2012   Procedure: LEFT HEART CATHETERIZATION WITH Beatrix Fetters;  Surgeon: Candee Furbish, MD;  Location: [facility] CATH LAB;  Service: Cardiovascular;;   MASTECTOMY W/ SENTINEL NODE BIOPSY Right 02/19/2021   Procedure: RIGHT MASTECTOMY;  Surgeon: Jovita Kussmaul, MD;  Location: [place];  Service: General;  Laterality: Right;  RNFA, PEC BLOCK   PARTIAL HYSTERECTOMY     RE-EXCISION OF BREAST CANCER,SUPERIOR MARGINS Right 01/15/2021   Procedure: RE-EXCISION RIGHT BREAST MEDIAL MARGIN;  Surgeon: Jovita Kussmaul, MD;  Location: [place];  Service: General;  Laterality: Right;   REPLACEMENT TOTAL KNEE Right    SENTINEL NODE BIOPSY Right 02/19/2021   Procedure: SENTINEL LYMPH NODE BIOPSY;  Surgeon: Jovita Kussmaul, MD;  Location: [place];  Service: General;  Laterality: Right;   TONSILLECTOMY  TUBAL LIGATION Bilateral     There were no vitals filed for this visit.   Subjective Assessment - 10/21/21 1455     Subjective Pt doing well with no new complaints.    Pertinent History CAD s/p CABG in 2012, breast CA with R mastectomy 2022, HTN, HLD, L bunionectomy, post-concussion syndrome, R TKA    Diagnostic tests none recent    Patient Stated Goals improve balance    Currently in Pain? No/denies                               [facility] Adult PT Treatment/Exercise - 10/21/21 0001       Lumbar Exercises: Stretches   Lower Trunk Rotation 4 reps;10 seconds    Figure 4 Stretch 2 reps;30 seconds;Supine;With overpressure    Figure 4  Stretch Limitations Bilat    Other Lumbar Stretch Exercise R lumbar flexion with rotation stretch 2x30 sec      Lumbar Exercises: Aerobic   Nustep L7x68min      Lumbar Exercises: Seated   Other Seated Lumbar Exercises fwd bends going into rotation touching the opposite LE 10x      Manual Therapy   Manual Therapy Soft tissue mobilization    Manual therapy comments in sidelying    Soft tissue mobilization STM to R glute med/min, QL, lumbar paraspinals                       PT Short Term Goals - 06/25/21 1449       PT SHORT TERM GOAL #1   Title Patient to be independent with initial HEP.    Time 3    Period Weeks    Status Achieved    Target Date 07/15/21               PT Long Term Goals - 10/16/21 1625       PT LONG TERM GOAL #1   Title Patient to be independent with advanced HEP.    Time 6    Period Weeks    Status On-going   09/04/21- doing neck exercises regularly, but vague about other exercises.  11/10- updated HEP and educated on importance of daily performance.   Target Date 11/27/21      PT LONG TERM GOAL #2   Title Patient to demonstrate B UE and LE strength >/=4+/5.    Time 6    Period Weeks    Status Achieved   08/25/21- 4+/5 bil hip flexor strength     PT LONG TERM GOAL #3   Title Patient to demonstrate cervical AROM WFL and without pain limiting.    Time 6    Period Weeks    Status Achieved   09/04/21- 60L, 58 R rotation, 40 extension, 38 flexion, no pain     PT LONG TERM GOAL #4   Title Patient to report 70% improvement in LBP.    Time 6    Period Weeks    Status Achieved   no low back pain     PT LONG TERM GOAL #5   Title Pt will improve balance as demonstrated by 45/56 on berg to decrease fall risk    Baseline 36/56    Time 6    Period Weeks    Status On-going   10/13/21- 41/56   Target Date 11/27/21      PT LONG TERM GOAL #6   Title Pt.  will demonstrate improved functional strength by complete 5x STS <40 seconds.     Baseline 47 sec with UE support    Time 6    Period Weeks    Status Achieved   10/13/21: 35.49 sec with UE support                  Plan - 10/21/21 1608     Clinical Impression Statement Pt mostly was limited today by R sided lower back pain with exercises. Found lots of restriction and tenderness along her R QL, lumbar paraspinals, and glute med. After the manual work, she did report some improvements with LBP and I showed her a stretch to help target the correct muscles to decrease tension. She still demonstrates decreased trunk rotation and hip mobility with gait. Instructions were needed with exercises only intermittently for form. Pt overall responded fairly.    Personal Factors and Comorbidities Age;Comorbidity 3+;Fitness;Past/Current Experience;Time since onset of injury/illness/exacerbation    Comorbidities CAD s/p CABG in 2012, breast CA with R mastectomy 2022, HTN, HLD, L bunionectomy, post-concussion syndrome, R TKA    PT Frequency 2x / week    PT Duration 6 weeks    PT Treatment/Interventions ADLs/Self Care Home Management;Cryotherapy;Electrical Stimulation;Iontophoresis 4mg /ml Dexamethasone;Moist Heat;Balance training;Therapeutic exercise;Therapeutic activities;Functional mobility training;Stair training;Gait training;Ultrasound;Neuromuscular re-education;Patient/family education;Manual techniques;Taping;Energy conservation;Dry needling;Passive range of motion    PT Next Visit Plan work on hip and trunk mobility; dynamic balance exercises; continue incorporate more dynamic movement with trunk rotation, gait training with cane    PT Home Exercise Plan Access Code: [email or web address], Access Code: [email or web address] (10/7)    Consulted and Agree with Plan of Care Patient             Patient will benefit from skilled therapeutic intervention in order to improve the following deficits and impairments:  Abnormal gait, Hypomobility, Decreased activity tolerance, Decreased strength, Increased  fascial restricitons, Pain, Impaired UE functional use, Decreased balance, Decreased mobility, Difficulty walking, Increased muscle spasms, Improper body mechanics, Decreased range of motion, Postural dysfunction, Impaired flexibility  Visit Diagnosis: Other symptoms and signs involving the musculoskeletal system  Chronic bilateral low back pain without sciatica  Unsteadiness on feet  Cervicalgia     Problem List Patient Active Problem List   Diagnosis Date Noted   Paroxysmal atrial fibrillation ([place]) 05/15/2021   Secondary hypercoagulable state ([place]) 05/15/2021   Myelopathy ([place]) 04/23/2021   Genetic testing 12/04/2020   Family history of breast cancer 11/18/2020   Erythrocytosis 11/14/2020   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  [place], [place], [address] Phone: [phone]   Fax:  [phone]  Name: [name] MRN: [id number] Date of Birth: May 03, 1946

## 2021-10-23 ENCOUNTER — Other Ambulatory Visit: Payer: Self-pay

## 2021-10-23 ENCOUNTER — Ambulatory Visit: Payer: Medicare HMO | Admitting: Physical Therapy

## 2021-10-23 ENCOUNTER — Encounter: Payer: Self-pay | Admitting: Physical Therapy

## 2021-10-23 DIAGNOSIS — G8929 Other chronic pain: Secondary | ICD-10-CM

## 2021-10-23 DIAGNOSIS — R29898 Other symptoms and signs involving the musculoskeletal system: Secondary | ICD-10-CM | POA: Diagnosis not present

## 2021-10-23 DIAGNOSIS — M545 Low back pain, unspecified: Secondary | ICD-10-CM | POA: Diagnosis not present

## 2021-10-23 DIAGNOSIS — M542 Cervicalgia: Secondary | ICD-10-CM

## 2021-10-23 DIAGNOSIS — R2681 Unsteadiness on feet: Secondary | ICD-10-CM | POA: Diagnosis not present

## 2021-10-23 NOTE — Therapy (Signed)
[name] [address]  [place], [place], [address] Phone: [phone]   Fax:  [phone]  Physical Therapy Treatment  Patient Details  Name: [name] MRN: [id number] Date of Birth: 1946-09-05 Referring Provider (PT): Kary Kos, MD   Encounter Date: 10/23/2021   PT End of Session - 10/23/21 1019     Visit Number 33    Number of Visits 40    Date for PT Re-Evaluation 11/27/21    Authorization Type [employer]    Progress Note Due on Visit 83    PT Start Time 1016    PT Stop Time 1059    PT Time Calculation (min) 43 min    Activity Tolerance Patient tolerated treatment well    Behavior During Therapy [facility] for tasks assessed/performed             Past Medical History:  Diagnosis Date   Allergic rhinitis    Breast cancer ([place]) 10/17/2020   CAD (coronary artery disease)    , s/p CAGB 01/2011- LIMA to LAD patent, all other vein grafts occluded. Circumflex DES 3/13 placed following lateral ischemia on nuclear stress test   Cervical spondylosis    , bulging  discs   Disc herniation    L4-L5, L5-S1   Family history of breast cancer 11/18/2020   Hypertension    Mixed hyperlipidemia    Obesity    Post concussion syndrome    processes information slowly,    Postmenopausal    Prediabetes     Past Surgical History:  Procedure Laterality Date   ABDOMINAL HYSTERECTOMY     ANTERIOR CERVICAL DECOMP/DISCECTOMY FUSION N/A 04/23/2021   Procedure: Anterior Cervical Discectomy Fusion - Cervical Four- Cervical Five - Cervical Five -Cervical Six;  Surgeon: Kary Kos, MD;  Location: [place];  Service: Neurosurgery;  Laterality: N/A;  Anterior Cervical Discectomy Fusion - Cervical Four- Cervical Five - Cervical Five -Cervical Six   Bone Spur Left    Foot   BREAST BIOPSY     BREAST ENHANCEMENT SURGERY     BREAST IMPLANT EXCHANGE     replaced rupture implant   BREAST LUMPECTOMY WITH RADIOACTIVE SEED  LOCALIZATION Right 12/11/2020   Procedure: RIGHT BREAST LUMPECTOMY WITH RADIOACTIVE SEED LOCALIZATION;  Surgeon: Jovita Kussmaul, MD;  Location: [place];  Service: General;  Laterality: Right;   CARDIAC CATHETERIZATION     COLONOSCOPY     CORONARY ARTERY BYPASS GRAFT     JOINT REPLACEMENT     right total knee replacement   Left Bunionectomy     LEFT HEART CATHETERIZATION WITH CORONARY/GRAFT ANGIOGRAM  02/05/2012   Procedure: LEFT HEART CATHETERIZATION WITH Beatrix Fetters;  Surgeon: Candee Furbish, MD;  Location: [facility] CATH LAB;  Service: Cardiovascular;;   MASTECTOMY W/ SENTINEL NODE BIOPSY Right 02/19/2021   Procedure: RIGHT MASTECTOMY;  Surgeon: Jovita Kussmaul, MD;  Location: [place];  Service: General;  Laterality: Right;  RNFA, PEC BLOCK   PARTIAL HYSTERECTOMY     RE-EXCISION OF BREAST CANCER,SUPERIOR MARGINS Right 01/15/2021   Procedure: RE-EXCISION RIGHT BREAST MEDIAL MARGIN;  Surgeon: Jovita Kussmaul, MD;  Location: [place];  Service: General;  Laterality: Right;   REPLACEMENT TOTAL KNEE Right    SENTINEL NODE BIOPSY Right 02/19/2021   Procedure: SENTINEL LYMPH NODE BIOPSY;  Surgeon: Jovita Kussmaul, MD;  Location: [place];  Service: General;  Laterality: Right;   TONSILLECTOMY     TUBAL LIGATION  Bilateral     There were no vitals filed for this visit.   Subjective Assessment - 10/23/21 1019     Subjective She pulled a muscle in her back on Monday and she has been self treating with Biofreeze. It was getting better but then she bent over to put her shoes on this morning and the back spasmed.    Pertinent History CAD s/p CABG in 2012, breast CA with R mastectomy 2022, HTN, HLD, L bunionectomy, post-concussion syndrome, R TKA    Patient Stated Goals improve balance    Currently in Pain? Yes    Pain Score 5     Pain Location Back    Pain Orientation Right    Pain Descriptors / Indicators Sore    Pain Type Acute pain                                [facility] Adult PT Treatment/Exercise - 10/23/21 0001       Lumbar Exercises: Stretches   Lower Trunk Rotation 4 reps;10 seconds    Hip Flexor Stretch Right;60 seconds;2 reps    Hip Flexor Stretch Limitations off EOB    Standing Extension 10 reps;5 seconds    Standing Extension Limitations UE support at wall    ITB Stretch Right;1 rep;30 seconds    ITB Stretch Limitations leg off EOB    Figure 4 Stretch 2 reps;30 seconds;Supine;With overpressure    Other Lumbar Stretch Exercise R QL stretch legs crossed and left rotation supine      Lumbar Exercises: Aerobic   Nustep L5 x 6      Manual Therapy   Manual Therapy Myofascial release    Soft tissue mobilization R QL release in SDLY; Rt psoas release in supine                     PT Education - 10/23/21 1101     Education Details HEP progressed with psoas stretches    Person(s) Educated Patient    Methods Explanation;Demonstration;Handout    Comprehension Verbalized understanding;Returned demonstration              PT Short Term Goals - 06/25/21 1449       PT SHORT TERM GOAL #1   Title Patient to be independent with initial HEP.    Time 3    Period Weeks    Status Achieved    Target Date 07/15/21               PT Long Term Goals - 10/16/21 1625       PT LONG TERM GOAL #1   Title Patient to be independent with advanced HEP.    Time 6    Period Weeks    Status On-going   09/04/21- doing neck exercises regularly, but vague about other exercises.  11/10- updated HEP and educated on importance of daily performance.   Target Date 11/27/21      PT LONG TERM GOAL #2   Title Patient to demonstrate B UE and LE strength >/=4+/5.    Time 6    Period Weeks    Status Achieved   08/25/21- 4+/5 bil hip flexor strength     PT LONG TERM GOAL #3   Title Patient to demonstrate cervical AROM WFL and without pain limiting.    Time 6    Period Weeks    Status Achieved   09/04/21-  60L,  58 R rotation, 40 extension, 38 flexion, no pain     PT LONG TERM GOAL #4   Title Patient to report 70% improvement in LBP.    Time 6    Period Weeks    Status Achieved   no low back pain     PT LONG TERM GOAL #5   Title Pt will improve balance as demonstrated by 45/56 on berg to decrease fall risk    Baseline 36/56    Time 6    Period Weeks    Status On-going   10/13/21- 41/56   Target Date 11/27/21      PT LONG TERM GOAL #6   Title Pt. will demonstrate improved functional strength by complete 5x STS <40 seconds.    Baseline 47 sec with UE support    Time 6    Period Weeks    Status Achieved   10/13/21: 35.49 sec with UE support                  Plan - 10/23/21 1102     Clinical Impression Statement Pt presents with increased LBP to 6/10 today after leaning over to put on her shoes this morning. We focused mainly on stretching today due to this. She had spasms in her right psoas and QL which responded well to manual release. Stretches were issued to compliment [place]. Plan to return to dynamic movement and balance next session if indicated.    PT Treatment/Interventions ADLs/Self Care Home Management;Cryotherapy;Electrical Stimulation;Iontophoresis 4mg /ml Dexamethasone;Moist Heat;Balance training;Therapeutic exercise;Therapeutic activities;Functional mobility training;Stair training;Gait training;Ultrasound;Neuromuscular re-education;Patient/family education;Manual techniques;Taping;Energy conservation;Dry needling;Passive range of motion    PT Next Visit Plan work on hip and trunk mobility; dynamic balance exercises; continue incorporate more dynamic movement with trunk rotation, gait training with cane    PT Home Exercise Plan Access Code: [email or web address], Access Code: [email or web address] (10/7)    Consulted and Agree with Plan of Care Patient             Patient will benefit from skilled therapeutic intervention in order to improve the following deficits and impairments:  Abnormal  gait, Hypomobility, Decreased activity tolerance, Decreased strength, Increased fascial restricitons, Pain, Impaired UE functional use, Decreased balance, Decreased mobility, Difficulty walking, Increased muscle spasms, Improper body mechanics, Decreased range of motion, Postural dysfunction, Impaired flexibility  Visit Diagnosis: Other symptoms and signs involving the musculoskeletal system  Chronic bilateral low back pain without sciatica  Unsteadiness on feet  Cervicalgia     Problem List Patient Active Problem List   Diagnosis Date Noted   Paroxysmal atrial fibrillation ([place]) 05/15/2021   Secondary hypercoagulable state ([place]) 05/15/2021   Myelopathy ([place]) 04/23/2021   Genetic testing 12/04/2020   Family history of breast cancer 11/18/2020   Erythrocytosis 11/14/2020   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  [place], [place], [address] Phone: [phone]   Fax:  [phone]  Name: Caily Rakers MRN: [id number] Date of Birth: 02/14/1946

## 2021-10-23 NOTE — Patient Instructions (Signed)
Access Code: [email or web address] URL: [email or web address] Date: 10/23/2021 Prepared by: Almyra Free  Exercises Supine Piriformis Stretch with Foot on Ground - 2 x daily - 7 x weekly - 3 sets - 2 reps - 30 sec hold Supine Figure 4 Piriformis Stretch - 2 x daily - 7 x weekly - 3 sets - 2 reps - 30 sec hold Modified Thomas Stretch - 1 x daily - 7 x weekly - 1 sets - 2 reps - 60 sec hold Standing Lumbar Extension at [place] - 4-5 x daily - 7 x weekly - 1 sets - 10 reps - 5 sec hold

## 2021-10-28 ENCOUNTER — Other Ambulatory Visit: Payer: Self-pay

## 2021-10-28 ENCOUNTER — Ambulatory Visit: Payer: Medicare HMO | Admitting: Physical Therapy

## 2021-10-28 DIAGNOSIS — R2681 Unsteadiness on feet: Secondary | ICD-10-CM | POA: Diagnosis not present

## 2021-10-28 DIAGNOSIS — G8929 Other chronic pain: Secondary | ICD-10-CM | POA: Diagnosis not present

## 2021-10-28 DIAGNOSIS — M542 Cervicalgia: Secondary | ICD-10-CM

## 2021-10-28 DIAGNOSIS — R29898 Other symptoms and signs involving the musculoskeletal system: Secondary | ICD-10-CM | POA: Diagnosis not present

## 2021-10-28 DIAGNOSIS — M545 Low back pain, unspecified: Secondary | ICD-10-CM | POA: Diagnosis not present

## 2021-10-28 NOTE — Therapy (Signed)
[facility] [address]  [place], [place], [address] Phone: [phone]   Fax:  [phone]  Physical Therapy Treatment  Patient Details  Name: [name] MRN: [id number] Date of Birth: 1946-11-24 Referring Provider (PT): Kary Kos, MD   Encounter Date: 10/28/2021   PT End of Session - 10/28/21 1321     Visit Number 34    Number of Visits 40    Date for PT Re-Evaluation 11/27/21    Authorization Type [employer] Medicare    Progress Note Due on Visit 72    PT Start Time 1317    PT Stop Time 1355    PT Time Calculation (min) 38 min    Activity Tolerance Patient tolerated treatment well    Behavior During Therapy [facility] for tasks assessed/performed             Past Medical History:  Diagnosis Date   Allergic rhinitis    Breast cancer ([place]) 10/17/2020   CAD (coronary artery disease)    , s/p CAGB 01/2011- LIMA to LAD patent, all other vein grafts occluded. Circumflex DES 3/13 placed following lateral ischemia on nuclear stress test   Cervical spondylosis    , bulging  discs   Disc herniation    L4-L5, L5-S1   Family history of breast cancer 11/18/2020   Hypertension    Mixed hyperlipidemia    Obesity    Post concussion syndrome    processes information slowly,    Postmenopausal    Prediabetes     Past Surgical History:  Procedure Laterality Date   ABDOMINAL HYSTERECTOMY     ANTERIOR CERVICAL DECOMP/DISCECTOMY FUSION N/A 04/23/2021   Procedure: Anterior Cervical Discectomy Fusion - Cervical Four- Cervical Five - Cervical Five -Cervical Six;  Surgeon: Kary Kos, MD;  Location: [place];  Service: Neurosurgery;  Laterality: N/A;  Anterior Cervical Discectomy Fusion - Cervical Four- Cervical Five - Cervical Five -Cervical Six   Bone Spur Left    Foot   BREAST BIOPSY     BREAST ENHANCEMENT SURGERY     BREAST IMPLANT EXCHANGE     replaced rupture implant   BREAST LUMPECTOMY WITH RADIOACTIVE SEED  LOCALIZATION Right 12/11/2020   Procedure: RIGHT BREAST LUMPECTOMY WITH RADIOACTIVE SEED LOCALIZATION;  Surgeon: Jovita Kussmaul, MD;  Location: [place];  Service: General;  Laterality: Right;   CARDIAC CATHETERIZATION     COLONOSCOPY     CORONARY ARTERY BYPASS GRAFT     JOINT REPLACEMENT     right total knee replacement   Left Bunionectomy     LEFT HEART CATHETERIZATION WITH CORONARY/GRAFT ANGIOGRAM  02/05/2012   Procedure: LEFT HEART CATHETERIZATION WITH Beatrix Fetters;  Surgeon: Candee Furbish, MD;  Location: [facility] CATH LAB;  Service: Cardiovascular;;   MASTECTOMY W/ SENTINEL NODE BIOPSY Right 02/19/2021   Procedure: RIGHT MASTECTOMY;  Surgeon: Jovita Kussmaul, MD;  Location: [facility];  Service: General;  Laterality: Right;  RNFA, PEC BLOCK   PARTIAL HYSTERECTOMY     RE-EXCISION OF BREAST CANCER,SUPERIOR MARGINS Right 01/15/2021   Procedure: RE-EXCISION RIGHT BREAST MEDIAL MARGIN;  Surgeon: Jovita Kussmaul, MD;  Location: [facility];  Service: General;  Laterality: Right;   REPLACEMENT TOTAL KNEE Right    SENTINEL NODE BIOPSY Right 02/19/2021   Procedure: SENTINEL LYMPH NODE BIOPSY;  Surgeon: Jovita Kussmaul, MD;  Location: [facility];  Service: General;  Laterality: Right;   TONSILLECTOMY     TUBAL LIGATION  Bilateral     There were no vitals filed for this visit.   Subjective Assessment - 10/28/21 1320     Subjective Patient reported that she is still having hip pain but took tylenol/advil mix and "should be kicking in soon."    Pertinent History CAD s/p CABG in 2012, breast CA with R mastectomy 2022, HTN, HLD, L bunionectomy, post-concussion syndrome, R TKA    Patient Stated Goals improve balance    Currently in Pain? Yes    Pain Score 5     Pain Location Hip    Pain Orientation Right                               [facility] Adult PT Treatment/Exercise - 10/28/21 0001       Exercises   Exercises Knee/Hip      Lumbar Exercises: Aerobic    Nustep L6 x 6 min   cues to increase speed and amplitude     Lumbar Exercises: Seated   Hip Flexion on Ball Both;AROM    Hip Flexion on Ball Limitations seated marching on ball, needing minA and VC to maintain balance when lifting leg.  Foward bends x 10 while seated on ball.    Sit to Stand 20 reps    Sit to Stand Limitations cues for foward trunk flexion and to increase speed    Other Seated Lumbar Exercises On swiss ball with CGA for safety throughout - pelvic tilts x 10, CW and CCW hip rotation x 10, reaching to side for thoracic/neck rotation to therapists hands x 10 bil                 Balance Exercises - 10/28/21 0001       Balance Exercises: Standing   Stepping Strategy Lateral;Limitations;Anterior    Stepping Strategy Limitations Diagonal steps to reach cone x 10 bil with CGA for safety.    Other Standing Exercises reaching overhead with weighted ball (yellow) x 10    Other Standing Exercises Comments diagonal reaching towards cones with weighted ball (holding with both hands) x 10 bil CGA for safety                  PT Short Term Goals - 06/25/21 1449       PT SHORT TERM GOAL #1   Title Patient to be independent with initial HEP.    Time 3    Period Weeks    Status Achieved    Target Date 07/15/21               PT Long Term Goals - 10/16/21 1625       PT LONG TERM GOAL #1   Title Patient to be independent with advanced HEP.    Time 6    Period Weeks    Status On-going   09/04/21- doing neck exercises regularly, but vague about other exercises.  11/10- updated HEP and educated on importance of daily performance.   Target Date 11/27/21      PT LONG TERM GOAL #2   Title Patient to demonstrate B UE and LE strength >/=4+/5.    Time 6    Period Weeks    Status Achieved   08/25/21- 4+/5 bil hip flexor strength     PT LONG TERM GOAL #3   Title Patient to demonstrate cervical AROM WFL and without pain limiting.    Time 6    Period  Weeks     Status Achieved   09/04/21- 60L, 58 R rotation, 40 extension, 38 flexion, no pain     PT LONG TERM GOAL #4   Title Patient to report 70% improvement in LBP.    Time 6    Period Weeks    Status Achieved   no low back pain     PT LONG TERM GOAL #5   Title Pt will improve balance as demonstrated by 45/56 on berg to decrease fall risk    Baseline 36/56    Time 6    Period Weeks    Status On-going   10/13/21- 41/56   Target Date 11/27/21      PT LONG TERM GOAL #6   Title Pt. will demonstrate improved functional strength by complete 5x STS <40 seconds.    Baseline 47 sec with UE support    Time 6    Period Weeks    Status Achieved   10/13/21: 35.49 sec with UE support                  Plan - 10/28/21 1357     Clinical Impression Statement Patient reported some pain entering session today, did not directly address pain today but focused on exercises incorporating rotation, reported no pain at end of session.  She still moves very slowly, cues throughout to increase speed and amplitude of movement.  Focus of session was balance including seated balance today on swiss ball, and standing balance and stepping strategy.  She would benefit from continued skilled therapy    PT Treatment/Interventions ADLs/Self Care Home Management;Cryotherapy;Electrical Stimulation;Iontophoresis 4mg /ml Dexamethasone;Moist Heat;Balance training;Therapeutic exercise;Therapeutic activities;Functional mobility training;Stair training;Gait training;Ultrasound;Neuromuscular re-education;Patient/family education;Manual techniques;Taping;Energy conservation;Dry needling;Passive range of motion    PT Next Visit Plan work on hip and trunk mobility; dynamic balance exercises; continue incorporate more dynamic movement with trunk rotation, gait training with cane    PT Home Exercise Plan Access Code: [email or web address], Access Code: [email or web address] (10/7)    Consulted and Agree with Plan of Care Patient             Patient will  benefit from skilled therapeutic intervention in order to improve the following deficits and impairments:  Abnormal gait, Hypomobility, Decreased activity tolerance, Decreased strength, Increased fascial restricitons, Pain, Impaired UE functional use, Decreased balance, Decreased mobility, Difficulty walking, Increased muscle spasms, Improper body mechanics, Decreased range of motion, Postural dysfunction, Impaired flexibility  Visit Diagnosis: Other symptoms and signs involving the musculoskeletal system  Chronic bilateral low back pain without sciatica  Unsteadiness on feet  Cervicalgia     Problem List Patient Active Problem List   Diagnosis Date Noted   Paroxysmal atrial fibrillation ([place]) 05/15/2021   Secondary hypercoagulable state ([place]) 05/15/2021   Myelopathy ([place]) 04/23/2021   Genetic testing 12/04/2020   Family history of breast cancer 11/18/2020   Erythrocytosis 11/14/2020   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  [place], [place], [address] Phone: [phone]   Fax:  [phone]  Name: Joslyn Ramos MRN: [id number] Date of Birth: 10-04-1946

## 2021-11-03 ENCOUNTER — Other Ambulatory Visit: Payer: Self-pay

## 2021-11-03 ENCOUNTER — Ambulatory Visit: Payer: Medicare HMO | Admitting: Physical Therapy

## 2021-11-03 ENCOUNTER — Encounter: Payer: Self-pay | Admitting: Physical Therapy

## 2021-11-03 DIAGNOSIS — R2681 Unsteadiness on feet: Secondary | ICD-10-CM

## 2021-11-03 DIAGNOSIS — M542 Cervicalgia: Secondary | ICD-10-CM | POA: Diagnosis not present

## 2021-11-03 DIAGNOSIS — M545 Low back pain, unspecified: Secondary | ICD-10-CM

## 2021-11-03 DIAGNOSIS — G8929 Other chronic pain: Secondary | ICD-10-CM | POA: Diagnosis not present

## 2021-11-03 DIAGNOSIS — R29898 Other symptoms and signs involving the musculoskeletal system: Secondary | ICD-10-CM | POA: Diagnosis not present

## 2021-11-03 NOTE — Therapy (Addendum)
PHYSICAL THERAPY DISCHARGE SUMMARY  Visits from Start of Care: 35  Current functional level related to goals / functional outcomes: Patient had met original goals for strength, cervical AROM and back pain.     Remaining deficits: Deficits in mobility and balance.    Education / Equipment: HEP  Plan: Patient goals were not met. Patient is being discharged due to illness, she had the flu and cancelled remaining visits.  Last seen on 11/03/2021.  New order would be needed to return to therapy to continue addressing gait and balance deficits.    Rennie Natter, PT, DPT 12/12/2021   [facility] [address]  [place], [place], [address] Phone: [phone]   Fax:  [phone]  Physical Therapy Treatment  Patient Details  Name: [name] MRN: [id number] Date of Birth: 11-Jan-1946 Referring Provider (PT): Kary Kos, MD   Encounter Date: 11/03/2021   PT End of Session - 11/03/21 1034     Visit Number 35    Number of Visits 40    Date for PT Re-Evaluation 11/27/21    Authorization Type [employer]    Progress Note Due on Visit 73    PT Start Time 1018    PT Stop Time 1100    PT Time Calculation (min) 42 min    Activity Tolerance Patient tolerated treatment well    Behavior During Therapy [facility] for tasks assessed/performed             Past Medical History:  Diagnosis Date   Allergic rhinitis    Breast cancer ([place]) 10/17/2020   CAD (coronary artery disease)    , s/p CAGB 01/2011- LIMA to LAD patent, all other vein grafts occluded. Circumflex DES 3/13 placed following lateral ischemia on nuclear stress test   Cervical spondylosis    , bulging  discs   Disc herniation    L4-L5, L5-S1   Family history of breast cancer 11/18/2020   Hypertension    Mixed hyperlipidemia    Obesity    Post concussion syndrome    processes information slowly,    Postmenopausal    Prediabetes     Past  Surgical History:  Procedure Laterality Date   ABDOMINAL HYSTERECTOMY     ANTERIOR CERVICAL DECOMP/DISCECTOMY FUSION N/A 04/23/2021   Procedure: Anterior Cervical Discectomy Fusion - Cervical Four- Cervical Five - Cervical Five -Cervical Six;  Surgeon: Kary Kos, MD;  Location: [place];  Service: Neurosurgery;  Laterality: N/A;  Anterior Cervical Discectomy Fusion - Cervical Four- Cervical Five - Cervical Five -Cervical Six   Bone Spur Left    Foot   BREAST BIOPSY     BREAST ENHANCEMENT SURGERY     BREAST IMPLANT EXCHANGE     replaced rupture implant   BREAST LUMPECTOMY WITH RADIOACTIVE SEED LOCALIZATION Right 12/11/2020   Procedure: RIGHT BREAST LUMPECTOMY WITH RADIOACTIVE SEED LOCALIZATION;  Surgeon: Jovita Kussmaul, MD;  Location: [place];  Service: General;  Laterality: Right;   CARDIAC CATHETERIZATION     COLONOSCOPY     CORONARY ARTERY BYPASS GRAFT     JOINT REPLACEMENT     right total knee replacement   Left Bunionectomy     LEFT HEART CATHETERIZATION WITH CORONARY/GRAFT ANGIOGRAM  02/05/2012   Procedure: LEFT HEART CATHETERIZATION WITH Beatrix Fetters;  Surgeon: Candee Furbish, MD;  Location: [facility] CATH LAB;  Service: Cardiovascular;;   MASTECTOMY W/ SENTINEL NODE BIOPSY Right 02/19/2021   Procedure: RIGHT MASTECTOMY;  Surgeon: Jovita Kussmaul, MD;  Location: [place];  Service: General;  Laterality: Right;  RNFA, PEC BLOCK   PARTIAL HYSTERECTOMY     RE-EXCISION OF BREAST CANCER,SUPERIOR MARGINS Right 01/15/2021   Procedure: RE-EXCISION RIGHT BREAST MEDIAL MARGIN;  Surgeon: Jovita Kussmaul, MD;  Location: [place];  Service: General;  Laterality: Right;   REPLACEMENT TOTAL KNEE Right    SENTINEL NODE BIOPSY Right 02/19/2021   Procedure: SENTINEL LYMPH NODE BIOPSY;  Surgeon: Jovita Kussmaul, MD;  Location: [place];  Service: General;  Laterality: Right;   TONSILLECTOMY     TUBAL LIGATION Bilateral     There were no vitals filed for this visit.   Subjective  Assessment - 11/03/21 1025     Subjective Patient is doing well, no new concerns, although having difficulty with ankles swelling.    Pertinent History CAD s/p CABG in 2012, breast CA with R mastectomy 2022, HTN, HLD, L bunionectomy, post-concussion syndrome, R TKA    Patient Stated Goals improve balance    Currently in Pain? No/denies                               [facility] Adult PT Treatment/Exercise - 11/03/21 0001       Ambulation/Gait   Ambulation/Gait Yes    Ambulation/Gait Assistance 6: Modified independent (Device/Increase time)    Ambulation Distance (Feet) 450 Feet    Assistive device 4-wheeled walker    Gait Pattern Step-through pattern    Ambulation Surface Level;Indoor    Gait Comments improved gait speed and turns with 4WRW, educated on safety with use, demonstrated how to fold/unfold.      Exercises   Exercises Knee/Hip      Lumbar Exercises: Aerobic   Nustep L6 x 6 min      Knee/Hip Exercises: Stretches   Active Hamstring Stretch Both;3 reps;30 seconds    Active Hamstring Stretch Limitations seated    Other Knee/Hip Stretches toe touches seated 2 x 10      Knee/Hip Exercises: Seated   Long Arc Quad Strengthening;Both;10 reps    Long Arc Quad Limitations with ankle pump, cues needed to lift leg higher, noted HS tightness.    Other Seated Knee/Hip Exercises seated monster steps for hip mobility 2 x 10    Other Seated Knee/Hip Exercises turning with boom-wackers, cues to allow hips to turn as well for hip mobility.    Sit to Sand 2 sets;10 reps;without UE support;Other (comment)   cues throuhout to increase trunk flexion, target                      PT Short Term Goals - 06/25/21 1449       PT SHORT TERM GOAL #1   Title Patient to be independent with initial HEP.    Time 3    Period Weeks    Status Achieved    Target Date 07/15/21               PT Long Term Goals - 10/16/21 1625       PT LONG TERM GOAL #1   Title  Patient to be independent with advanced HEP.    Time 6    Period Weeks    Status On-going   09/04/21- doing neck exercises regularly, but vague about other exercises.  11/10- updated HEP and educated on importance of daily performance.   Target Date 11/27/21  PT LONG TERM GOAL #2   Title Patient to demonstrate B UE and LE strength >/=4+/5.    Time 6    Period Weeks    Status Achieved   08/25/21- 4+/5 bil hip flexor strength     PT LONG TERM GOAL #3   Title Patient to demonstrate cervical AROM WFL and without pain limiting.    Time 6    Period Weeks    Status Achieved   09/04/21- 60L, 58 R rotation, 40 extension, 38 flexion, no pain     PT LONG TERM GOAL #4   Title Patient to report 70% improvement in LBP.    Time 6    Period Weeks    Status Achieved   no low back pain     PT LONG TERM GOAL #5   Title Pt will improve balance as demonstrated by 45/56 on berg to decrease fall risk    Baseline 36/56    Time 6    Period Weeks    Status On-going   10/13/21- 41/56   Target Date 11/27/21      PT LONG TERM GOAL #6   Title Pt. will demonstrate improved functional strength by complete 5x STS <40 seconds.    Baseline 47 sec with UE support    Time 6    Period Weeks    Status Achieved   10/13/21: 35.49 sec with UE support                  Plan - 11/03/21 1217     Clinical Impression Statement Patient participated well today without complaint of pain.  She still demonstrates tightness throughout hips and hamstrings, and needed a lot of cuing for foward weight shift for standing in order to not fall backwards or push against mat table to assist with standing.  Trialed 5KCL today, she responded very well, able to walk faster and with more confidence, and able to turn with more ease compared to [facility].  Discussed she could purchase from medical supply store as Medicare would not cover as already issued the [place].  She would benefit from continued skilled therapy.    PT  Treatment/Interventions ADLs/Self Care Home Management;Cryotherapy;Electrical Stimulation;Iontophoresis 32m/ml Dexamethasone;Moist Heat;Balance training;Therapeutic exercise;Therapeutic activities;Functional mobility training;Stair training;Gait training;Ultrasound;Neuromuscular re-education;Patient/family education;Manual techniques;Taping;Energy conservation;Dry needling;Passive range of motion    PT Next Visit Plan work on hip and trunk mobility; dynamic balance exercises; continue incorporate more dynamic movement with trunk rotation, gait training with cane    PT Home Exercise Plan Access Code: [email or web address], Access Code: [email or web address](10/7)    Consulted and Agree with Plan of Care Patient             Patient will benefit from skilled therapeutic intervention in order to improve the following deficits and impairments:  Abnormal gait, Hypomobility, Decreased activity tolerance, Decreased strength, Increased fascial restricitons, Pain, Impaired UE functional use, Decreased balance, Decreased mobility, Difficulty walking, Increased muscle spasms, Improper body mechanics, Decreased range of motion, Postural dysfunction, Impaired flexibility  Visit Diagnosis: Other symptoms and signs involving the musculoskeletal system  Chronic bilateral low back pain without sciatica  Unsteadiness on feet  Cervicalgia     Problem List Patient Active Problem List   Diagnosis Date Noted   Paroxysmal atrial fibrillation ([place] 05/15/2021   Secondary hypercoagulable state ([place] 05/15/2021   Myelopathy ([place] 04/23/2021   Genetic testing 12/04/2020   Family history of breast cancer 11/18/2020   Erythrocytosis 11/14/2020   Ductal carcinoma in situ (DCIS) of right breast  11/05/2020   Essential hypertension, benign 10/09/2013   CAD (coronary artery disease)    Hypertension    Mixed hyperlipidemia    Prediabetes    Coronary atherosclerosis of native coronary artery 02/05/2012    Rennie Natter, PT,  DPT 11/03/2021, 12:19 PM  [facility] [address]  [place], [place], [address] Phone: [phone]   Fax:  [phone]  Name: Lajoy Vanamburg MRN: [id number] Date of Birth: [birth year]

## 2021-11-05 ENCOUNTER — Ambulatory Visit: Payer: Medicare HMO

## 2021-11-10 ENCOUNTER — Ambulatory Visit: Payer: Medicare HMO

## 2021-11-12 ENCOUNTER — Ambulatory Visit: Payer: Medicare HMO | Admitting: Physical Therapy

## 2021-11-17 ENCOUNTER — Ambulatory Visit: Payer: Medicare HMO

## 2021-11-19 ENCOUNTER — Encounter: Payer: Medicare HMO | Admitting: Physical Therapy

## 2021-11-27 ENCOUNTER — Encounter: Payer: Medicare HMO | Admitting: Physical Therapy

## 2021-12-02 ENCOUNTER — Encounter: Payer: Medicare HMO | Admitting: Physical Therapy

## 2021-12-05 DIAGNOSIS — Z008 Encounter for other general examination: Secondary | ICD-10-CM | POA: Diagnosis not present

## 2021-12-05 DIAGNOSIS — R32 Unspecified urinary incontinence: Secondary | ICD-10-CM | POA: Diagnosis not present

## 2021-12-05 DIAGNOSIS — D6869 Other thrombophilia: Secondary | ICD-10-CM | POA: Diagnosis not present

## 2021-12-05 DIAGNOSIS — Z880 Allergy status to penicillin: Secondary | ICD-10-CM | POA: Diagnosis not present

## 2021-12-05 DIAGNOSIS — M1990 Unspecified osteoarthritis, unspecified site: Secondary | ICD-10-CM | POA: Diagnosis not present

## 2021-12-05 DIAGNOSIS — E785 Hyperlipidemia, unspecified: Secondary | ICD-10-CM | POA: Diagnosis not present

## 2021-12-05 DIAGNOSIS — I10 Essential (primary) hypertension: Secondary | ICD-10-CM | POA: Diagnosis not present

## 2021-12-05 DIAGNOSIS — Z882 Allergy status to sulfonamides status: Secondary | ICD-10-CM | POA: Diagnosis not present

## 2021-12-05 DIAGNOSIS — R600 Localized edema: Secondary | ICD-10-CM | POA: Diagnosis not present

## 2021-12-05 DIAGNOSIS — I4891 Unspecified atrial fibrillation: Secondary | ICD-10-CM | POA: Diagnosis not present

## 2021-12-05 DIAGNOSIS — I2510 Atherosclerotic heart disease of native coronary artery without angina pectoris: Secondary | ICD-10-CM | POA: Diagnosis not present

## 2021-12-05 DIAGNOSIS — Z791 Long term (current) use of non-steroidal anti-inflammatories (NSAID): Secondary | ICD-10-CM | POA: Diagnosis not present

## 2021-12-05 DIAGNOSIS — Z853 Personal history of malignant neoplasm of breast: Secondary | ICD-10-CM | POA: Diagnosis not present

## 2021-12-09 DIAGNOSIS — H40013 Open angle with borderline findings, low risk, bilateral: Secondary | ICD-10-CM | POA: Diagnosis not present

## 2021-12-09 DIAGNOSIS — H04123 Dry eye syndrome of bilateral lacrimal glands: Secondary | ICD-10-CM | POA: Diagnosis not present

## 2021-12-09 DIAGNOSIS — H25813 Combined forms of age-related cataract, bilateral: Secondary | ICD-10-CM | POA: Diagnosis not present

## 2021-12-09 DIAGNOSIS — D3131 Benign neoplasm of right choroid: Secondary | ICD-10-CM | POA: Diagnosis not present

## 2021-12-09 DIAGNOSIS — H524 Presbyopia: Secondary | ICD-10-CM | POA: Diagnosis not present

## 2022-01-03 ENCOUNTER — Other Ambulatory Visit: Payer: Self-pay | Admitting: Cardiology

## 2022-01-07 ENCOUNTER — Telehealth: Payer: Self-pay | Admitting: Cardiology

## 2022-01-07 DIAGNOSIS — R609 Edema, unspecified: Secondary | ICD-10-CM

## 2022-01-07 DIAGNOSIS — I2510 Atherosclerotic heart disease of native coronary artery without angina pectoris: Secondary | ICD-10-CM

## 2022-01-07 MED ORDER — FUROSEMIDE 20 MG PO TABS: 20.0000 mg | ORAL_TABLET | Freq: Every day | ORAL | 0 refills | Status: DC

## 2022-01-07 NOTE — Telephone Encounter (Signed)
Pt is also to consume foods higher in potassium.

## 2022-01-07 NOTE — Telephone Encounter (Signed)
Pt c/o swelling: STAT is pt has developed SOB within 24 hours  If swelling, where is the swelling located? Swelling on both feet and ankles. Feet kind of redish/purplish color.   How much weight have you gained and in what time span? no  Have you gained 3 pounds in a day or 5 pounds in a week? no  Do you have a log of your daily weights (if so, list)? no  Are you currently taking a fluid pill? yes  Are you currently SOB? no  Have you traveled recently? no   Since been loosing a little bit of weight since January, she has been dieting.

## 2022-01-07 NOTE — Telephone Encounter (Signed)
Outreach made to Pt.  Per Pt her feet have been swollen the last 2 weeks.  Now they have turned purplish and Pt is concerned.  Pt states she take her furosemide 20 mg PO daily-NOT as needed (corrected on Pt's med list)  Last lab work was February 2022- Cr 0.74 and potassium 3.4.    Discussed with Dr. Marlou Porch nurse.  Will have Pt take lasix 40 mg daily PO x 3 days and repeat lab work next week.  F/u appt scheduled for 02/16/2022 with APP.  Will get lab work January 12, 2022.  Pt advised of above.

## 2022-01-12 ENCOUNTER — Other Ambulatory Visit: Payer: Self-pay

## 2022-01-12 ENCOUNTER — Other Ambulatory Visit: Payer: Medicare HMO | Admitting: *Deleted

## 2022-01-12 DIAGNOSIS — R609 Edema, unspecified: Secondary | ICD-10-CM

## 2022-01-12 DIAGNOSIS — I2510 Atherosclerotic heart disease of native coronary artery without angina pectoris: Secondary | ICD-10-CM | POA: Diagnosis not present

## 2022-01-12 LAB — BASIC METABOLIC PANEL
BUN/Creatinine Ratio: 23 (ref 12–28)
BUN: 15 mg/dL (ref 8–27)
CO2: 24 mmol/L (ref 20–29)
Calcium: 9.6 mg/dL (ref 8.7–10.3)
Chloride: 101 mmol/L (ref 96–106)
Creatinine, Ser: 0.66 mg/dL (ref 0.57–1.00)
Glucose: 102 mg/dL — ABNORMAL HIGH (ref 70–99)
Potassium: 4.3 mmol/L (ref 3.5–5.2)
Sodium: 140 mmol/L (ref 134–144)
eGFR: 92 mL/min/{1.73_m2} (ref >59)

## 2022-01-28 ENCOUNTER — Other Ambulatory Visit: Payer: Self-pay | Admitting: Cardiology

## 2022-01-28 ENCOUNTER — Other Ambulatory Visit: Payer: Self-pay | Admitting: *Deleted

## 2022-01-28 MED ORDER — ATENOLOL 50 MG PO TABS: 50.0000 mg | ORAL_TABLET | Freq: Every day | ORAL | 0 refills | Status: DC

## 2022-02-15 NOTE — Progress Notes (Deleted)
? ?Office Visit  ?  ?Patient Name: [name] ?Date of Encounter: 02/15/2022 ? ?PCP:  Orpah Melter, MD ?  ?[facility]  ?Cardiologist:  Candee Furbish, MD  ?Advanced Practice Provider:  No care team member to display ?Electrophysiologist:  None  ? ? ?Chief Complaint  ?  ?[name] is a 76 y.o. female with a hx of breast cancer, hypertension, CAD status post CABG 2012, hyperlipidemia, and atrial fibrillation who was recently seen in the atrial fibrillation clinic and presents today for follow-up visit. ? ?The patient was initially diagnosed with atrial fibrillation 04/23/2021 postoperatively following a cervical discectomy and fusion.  She was rate controlled on her home dose of atenolol.  Patient is on Eliquis for CHA2DS2-VASc score of 4.  When she was last seen in June 2022 she was back in normal sinus rhythm.  She denied any bleeding issues on anticoagulation. ? ?When last seen in June 2022 she denies symptoms of palpitations, chest pain, shortness of breath, orthopnea, PND, dizziness, presyncope, syncope, snoring, daytime somnolence, bleeding, neurologic sequelae.  Otherwise the patient was tolerating medications without difficulties and was without complaint. ? ?Today, she *** ? ?Past Medical History  ?  ?Past Medical History:  ?Diagnosis Date  ? Allergic rhinitis   ? Breast cancer ([place]) 10/17/2020  ? CAD (coronary artery disease)   ? , s/p CAGB 01/2011- LIMA to LAD patent, all other vein grafts occluded. Circumflex DES 3/13 placed following lateral ischemia on nuclear stress test  ? Cervical spondylosis   ? , bulging  discs  ? Disc herniation   ? L4-L5, L5-S1  ? Family history of breast cancer 11/18/2020  ? Hypertension   ? Mixed hyperlipidemia   ? Obesity   ? Post concussion syndrome   ? processes information slowly,   ? Postmenopausal   ? Prediabetes   ? ?Past Surgical History:  ?Procedure Laterality Date  ? ABDOMINAL HYSTERECTOMY    ? ANTERIOR CERVICAL  DECOMP/DISCECTOMY FUSION N/A 04/23/2021  ? Procedure: Anterior Cervical Discectomy Fusion - Cervical Four- Cervical Five - Cervical Five -Cervical Six;  Surgeon: Kary Kos, MD;  Location: [place];  Service: Neurosurgery;  Laterality: N/A;  Anterior Cervical Discectomy Fusion - Cervical Four- Cervical Five - Cervical Five -Cervical Six  ? Bone Spur Left   ? Foot  ? BREAST BIOPSY    ? BREAST ENHANCEMENT SURGERY    ? BREAST IMPLANT EXCHANGE    ? replaced rupture implant  ? BREAST LUMPECTOMY WITH RADIOACTIVE SEED LOCALIZATION Right 12/11/2020  ? Procedure: RIGHT BREAST LUMPECTOMY WITH RADIOACTIVE SEED LOCALIZATION;  Surgeon: Jovita Kussmaul, MD;  Location: [place];  Service: General;  Laterality: Right;  ? CARDIAC CATHETERIZATION    ? COLONOSCOPY    ? CORONARY ARTERY BYPASS GRAFT    ? JOINT REPLACEMENT    ? right total knee replacement  ? Left Bunionectomy    ? LEFT HEART CATHETERIZATION WITH CORONARY/GRAFT ANGIOGRAM  02/05/2012  ? Procedure: LEFT HEART CATHETERIZATION WITH Beatrix Fetters;  Surgeon: Candee Furbish, MD;  Location: [facility] CATH LAB;  Service: Cardiovascular;;  ? MASTECTOMY W/ SENTINEL NODE BIOPSY Right 02/19/2021  ? Procedure: RIGHT MASTECTOMY;  Surgeon: Jovita Kussmaul, MD;  Location: [place];  Service: General;  Laterality: Right;  RNFA, PEC BLOCK  ? PARTIAL HYSTERECTOMY    ? RE-EXCISION OF BREAST CANCER,SUPERIOR MARGINS Right 01/15/2021  ? Procedure: RE-EXCISION RIGHT BREAST MEDIAL MARGIN;  Surgeon: Jovita Kussmaul, MD;  Location: [facility];  Service: General;  Laterality: Right;  ? REPLACEMENT TOTAL KNEE Right   ? SENTINEL NODE BIOPSY Right 02/19/2021  ? Procedure: SENTINEL LYMPH NODE BIOPSY;  Surgeon: Jovita Kussmaul, MD;  Location: [place];  Service: General;  Laterality: Right;  ? TONSILLECTOMY    ? TUBAL LIGATION Bilateral   ? ? ?Allergies ? ?Allergies  ?Allergen Reactions  ? Norvasc [Amlodipine Besylate] Other (See Comments)  ?  Edema ?  ? Latex Itching and Rash  ? Lipitor  [Atorvastatin] Other (See Comments)  ?  Muscle aches  ? Lisinopril Swelling and Cough  ? Penicillins Rash  ?  Tolerated Ancef 2g IV on 02/19/21  ? Sulfa Antibiotics Rash  ? Tape Itching and Rash  ? Tetanus Toxoids Other (See Comments)  ?  Patient said "Didn't do well".  ? ? ?EKGs/Labs/Other Studies Reviewed:  ? ?The following studies were reviewed today: ? ?Echo 04/24/21 demonstrated  ? 1. Left ventricular ejection fraction, by estimation, is 65 to 70%. The  ?left ventricle has normal function. The left ventricle has no regional  ?wall motion abnormalities. There is mild concentric left ventricular  ?hypertrophy. Left ventricular diastolic  ?function could not be evaluated.  ? 2. Right ventricular systolic function is normal. The right ventricular  ?size is normal. There is normal pulmonary artery systolic pressure. The  ?estimated right ventricular systolic pressure is 35.0 mmHg.  ? 3. The mitral valve is degenerative. Mild mitral valve regurgitation. No  ?evidence of mitral stenosis.  ? 4. The aortic valve is tricuspid. There is mild calcification of the  ?aortic valve. There is mild thickening of the aortic valve. Aortic valve  ?regurgitation is not visualized. Mild aortic valve sclerosis is present,  ?with no evidence of aortic valve  ?stenosis.  ? 5. The inferior vena cava is normal in size with greater than 50%  ?respiratory variability, suggesting right atrial pressure of 3 mmHg.  ? ?Comparison(s): No significant change from prior study. LV function remains  ?normal. Now in atrial flutter.  ? ?  ? ?EKG:  EKG is *** ordered today.  The ekg ordered today demonstrates *** ? ?Recent Labs: ?04/23/2021: Magnesium 1.9; TSH 0.689 ?04/25/2021: Hemoglobin 15.1; Platelets 202 ?01/12/2022: BUN 15; Creatinine, Ser 0.66; Potassium 4.3; Sodium 140  ?Recent Lipid Panel ?   ?Component Value Date/Time  ? CHOL 147 06/11/2014 1029  ? TRIG 146.0 06/11/2014 1029  ? HDL 41.40 06/11/2014 1029  ? CHOLHDL 4 06/11/2014 1029  ? VLDL 29.2  06/11/2014 1029  ? [place] 76 06/11/2014 1029  ? ? ?Risk Assessment/Calculations:  ? ?CHA2DS2-VASc Score = 4  ?{Confirm score is correct.  If not, click here to update score.  REFRESH note.  :1} This indicates a 4.8% annual risk of stroke. ?The patient's score is based upon: ?CHF History: 0 ?HTN History: 1 ?Diabetes History: 0 ?Stroke History: 0 ?Vascular Disease History: 1 ?Age Score: 1 ?Gender Score: 1 ?  ?{This patient has a significant risk of stroke if diagnosed with atrial fibrillation.  ?Please consider VKA or DOAC agent for anticoagulation if the bleeding risk is acceptable.   ?You can also use the SmartPhrase .[place] for documentation.   :[id number]} ? ?Home Medications  ? ?No outpatient medications have been marked as taking for the 02/16/22 encounter (Appointment) with Elgie Collard, PA-C.  ?  ? ?Review of Systems  ? ***   ?All other systems reviewed and are otherwise negative except as noted above. ? ?Physical Exam  ?  ?VS:  There were no vitals taken for this visit. , BMI There is no height or weight on file to calculate BMI. ? ?Wt Readings from Last 3 Encounters:  ?10/16/21 164 lb 12.8 oz (74.8 kg)  ?05/15/21 166 lb (75.3 kg)  ?04/23/21 166 lb 0.1 oz (75.3 kg)  ?  ? ?GEN: Well nourished, well developed, in no acute distress. ?HEENT: normal. ?Neck: Supple, no JVD, carotid bruits, or masses. ?Cardiac: ***RRR, no murmurs, rubs, or gallops. No clubbing, cyanosis, edema.  ***Radials/PT 2+ and equal bilaterally.  ?Respiratory:  ***Respirations regular and unlabored, clear to auscultation bilaterally. ?GI: Soft, nontender, nondistended. ?MS: No deformity or atrophy. ?Skin: Warm and dry, no rash. ?Neuro:  Strength and sensation are intact. ?Psych: Normal affect. ? ?Assessment & Plan  ?  ?Paroxysmal atrial fibrillation ? ?CAD status post CABG ? ?Hypertension ? ?Hyperlipidemia ? ?Lower extremity edema ? ?  ? ? ?Disposition: Follow up {follow up:[id number]} with Candee Furbish, MD or APP. ? ?Signed, ?Elgie Collard,  PA-C ?02/15/2022, 7:06 PM ?[place]  ?

## 2022-02-16 ENCOUNTER — Ambulatory Visit: Payer: Medicare HMO | Admitting: Physician Assistant

## 2022-02-16 DIAGNOSIS — I480 Paroxysmal atrial fibrillation: Secondary | ICD-10-CM

## 2022-02-16 DIAGNOSIS — E785 Hyperlipidemia, unspecified: Secondary | ICD-10-CM

## 2022-02-16 DIAGNOSIS — R609 Edema, unspecified: Secondary | ICD-10-CM

## 2022-02-16 DIAGNOSIS — I10 Essential (primary) hypertension: Secondary | ICD-10-CM

## 2022-02-16 DIAGNOSIS — I2510 Atherosclerotic heart disease of native coronary artery without angina pectoris: Secondary | ICD-10-CM

## 2022-02-19 ENCOUNTER — Other Ambulatory Visit: Payer: Self-pay | Admitting: Cardiology

## 2022-02-20 ENCOUNTER — Other Ambulatory Visit: Payer: Self-pay | Admitting: Cardiovascular Disease

## 2022-03-14 ENCOUNTER — Other Ambulatory Visit: Payer: Self-pay | Admitting: Cardiology

## 2022-04-01 NOTE — Progress Notes (Signed)
? ?Cardiology Office Note   ? ?Date:  04/08/2022  ? ?ID:  [name], [name] December 06, 1946, MRN [id number] ? ? ?PCP:  Orpah Melter, MD ?  ?[place]  ?Cardiologist:  Candee Furbish, MD   ?Advanced Practice Provider:  No care team member to display ?Electrophysiologist:  None  ? ?[id number]}  ? ?Chief Complaint  ?Patient presents with  ? Follow-up  ? ? ?History of Present Illness:  ?[name] is a 76 y.o. female with history of CAD status post CABG in 2012, cardiac cath 02/2012 vein grafts were occluded, LIMA to the LAD was patent, DES to the circumflex, hypertension, hyperlipidemia, PAF on Eliquis, chronic diastolic CHF with lower extremity edema managed with Lasix. Had 3 surgeries for breast cancer last year and then fell and had a concussion ? ?Patient comes in with her husband. Complains of feet being red and swollen for about a month. Doesn't go away with elevating her feet. Watches salt closely.Denies chest pain, dyspnea, palpitations. Patient stopped Eliquis because she didn't like it. Has had speech delays and trouble conveying her thoughts worse since concussion 2 yrs ago. ? ?Past Medical History:  ?Diagnosis Date  ? Allergic rhinitis   ? Breast cancer ([place]) 10/17/2020  ? CAD (coronary artery disease)   ? , s/p CAGB 01/2011- LIMA to LAD patent, all other vein grafts occluded. Circumflex DES 3/13 placed following lateral ischemia on nuclear stress test  ? Cervical spondylosis   ? , bulging  discs  ? Disc herniation   ? L4-L5, L5-S1  ? Family history of breast cancer 11/18/2020  ? Hypertension   ? Mixed hyperlipidemia   ? Obesity   ? Post concussion syndrome   ? processes information slowly,   ? Postmenopausal   ? Prediabetes   ? ? ?Past Surgical History:  ?Procedure Laterality Date  ? ABDOMINAL HYSTERECTOMY    ? ANTERIOR CERVICAL DECOMP/DISCECTOMY FUSION N/A 04/23/2021  ? Procedure: Anterior Cervical Discectomy Fusion - Cervical Four- Cervical Five - Cervical Five -Cervical  Six;  Surgeon: Kary Kos, MD;  Location: [place];  Service: Neurosurgery;  Laterality: N/A;  Anterior Cervical Discectomy Fusion - Cervical Four- Cervical Five - Cervical Five -Cervical Six  ? Bone Spur Left   ? Foot  ? BREAST BIOPSY    ? BREAST ENHANCEMENT SURGERY    ? BREAST IMPLANT EXCHANGE    ? replaced rupture implant  ? BREAST LUMPECTOMY WITH RADIOACTIVE SEED LOCALIZATION Right 12/11/2020  ? Procedure: RIGHT BREAST LUMPECTOMY WITH RADIOACTIVE SEED LOCALIZATION;  Surgeon: Jovita Kussmaul, MD;  Location: [place];  Service: General;  Laterality: Right;  ? CARDIAC CATHETERIZATION    ? COLONOSCOPY    ? CORONARY ARTERY BYPASS GRAFT    ? JOINT REPLACEMENT    ? right total knee replacement  ? Left Bunionectomy    ? LEFT HEART CATHETERIZATION WITH CORONARY/GRAFT ANGIOGRAM  02/05/2012  ? Procedure: LEFT HEART CATHETERIZATION WITH Beatrix Fetters;  Surgeon: Candee Furbish, MD;  Location: [facility] CATH LAB;  Service: Cardiovascular;;  ? MASTECTOMY W/ SENTINEL NODE BIOPSY Right 02/19/2021  ? Procedure: RIGHT MASTECTOMY;  Surgeon: Jovita Kussmaul, MD;  Location: [place];  Service: General;  Laterality: Right;  RNFA, PEC BLOCK  ? PARTIAL HYSTERECTOMY    ? RE-EXCISION OF BREAST CANCER,SUPERIOR MARGINS Right 01/15/2021  ? Procedure: RE-EXCISION RIGHT BREAST MEDIAL MARGIN;  Surgeon: Jovita Kussmaul, MD;  Location: [place];  Service: General;  Laterality: Right;  ? REPLACEMENT  TOTAL KNEE Right   ? SENTINEL NODE BIOPSY Right 02/19/2021  ? Procedure: SENTINEL LYMPH NODE BIOPSY;  Surgeon: Jovita Kussmaul, MD;  Location: [place];  Service: General;  Laterality: Right;  ? TONSILLECTOMY    ? TUBAL LIGATION Bilateral   ? ? ?Current Medications: ?Current Meds  ?Medication Sig  ? acetaminophen (TYLENOL) 500 MG tablet Take 500-1,000 mg by mouth every 6 (six) hours as needed (pain).  ? apixaban (ELIQUIS) 5 MG TABS tablet Take 1 tablet (5 mg total) by mouth 2 (two) times daily.  ? atenolol (TENORMIN) 50 MG tablet TAKE 1  TABLET BY MOUTH DAILY. PLEASE KEEP UPCOMING APPOINTMENT IN MARCH 2023 FOR FUTURE REFILLS.  ? B Complex-C-E-Zn (EQL STRESS B-COMPLEX C/ZINC) TABS take 1 tablet by oral route every day  ? cephALEXin (KEFLEX) 500 MG capsule Take 500 mg by mouth 4 (four) times daily. 3x/day for 7 days  ? COVID-19 mRNA Vac-TriS, Pfizer, SUSP injection Inject into the muscle.  ? furosemide (LASIX) 20 MG tablet Take 1 tablet (20 mg total) by mouth daily.  ? ibuprofen (ADVIL) 200 MG tablet 1 tablet with food or milk as needed  ? ketoconazole (NIZORAL) 2 % cream Apply 1 application topically daily.  ? Multiple Vitamin (MULTIVITAMIN WITH MINERALS) TABS tablet Take 1 tablet by mouth daily.  ? Potassium Gluconate 550 MG TABS take 1 tablet by oral route every day  ? rosuvastatin (CRESTOR) 20 MG tablet Take 1 tablet (20 mg total) by mouth daily.  ? [DISCONTINUED] aspirin EC 81 MG tablet   ?  ? ?Allergies:   Norvasc [amlodipine besylate], Latex, Lipitor [atorvastatin], Lisinopril, Penicillins, Sulfa antibiotics, Tape, and Tetanus toxoids  ? ?Social History  ? ?Socioeconomic History  ? Marital status: Married  ?  Spouse name: Not on file  ? Number of children: Not on file  ? Years of education: Not on file  ? Highest education level: Not on file  ?Occupational History  ? Not on file  ?Tobacco Use  ? Smoking status: Never  ? Smokeless tobacco: Never  ?Vaping Use  ? Vaping Use: Never used  ?Substance and Sexual Activity  ? Alcohol use: Not Currently  ?  Comment: Occasionally  ? Drug use: No  ? Sexual activity: Not on file  ?Other Topics Concern  ? Not on file  ?Social History Narrative  ? Right handed  ? Drinks cafffeine  ? 2 story home  ? ?Social Determinants of Health  ? ?Financial Resource Strain: Not on file  ?Food Insecurity: Not on file  ?Transportation Needs: Not on file  ?Physical Activity: Not on file  ?Stress: Not on file  ?Social Connections: Not on file  ?  ? ?Family History:  The patient's  family history includes Breast cancer in her  paternal aunt and paternal grandmother; Heart attack in her father; Heart disease in her brother, brother, and mother; Hypertension in her mother.  ? ?ROS:   ?Please see the history of present illness.    ?ROS All other systems reviewed and are negative. ? ? ?PHYSICAL EXAM:   ?VS:  BP 110/76   Pulse 80   Ht '5\' 6"'$  (1.676 m)   Wt 152 lb 6.4 oz (69.1 kg)   SpO2 99%   BMI 24.60 kg/m?   ?Physical Exam  ?GEN: Well nourished, well developed, in no acute distress  ?Neck: no JVD, carotid bruits, or masses ?Cardiac:RRR; no murmurs, rubs, or gallops  ?Respiratory:  clear to auscultation bilaterally, normal work of breathing ?GI:  soft, nontender, nondistended, + BS ?Ext: erythema with mild edema, Good distal pulses bilaterally ?Neuro:  Alert and Oriented x 3, trouble communicating thoughts ? Psych: flat affect ? ?Wt Readings from Last 3 Encounters:  ?04/08/22 152 lb 6.4 oz (69.1 kg)  ?10/16/21 164 lb 12.8 oz (74.8 kg)  ?05/15/21 166 lb (75.3 kg)  ?  ? ? ?Studies/Labs Reviewed:  ? ?EKG:  EKG is not ordered today.  ? ?Recent Labs: ?04/23/2021: Magnesium 1.9; TSH 0.689 ?04/25/2021: Hemoglobin 15.1; Platelets 202 ?01/12/2022: BUN 15; Creatinine, Ser 0.66; Potassium 4.3; Sodium 140  ? ?Lipid Panel ?   ?Component Value Date/Time  ? CHOL 147 06/11/2014 1029  ? TRIG 146.0 06/11/2014 1029  ? HDL 41.40 06/11/2014 1029  ? CHOLHDL 4 06/11/2014 1029  ? VLDL 29.2 06/11/2014 1029  ? [place] 76 06/11/2014 1029  ? ? ?Additional studies/ records that were reviewed today include:  ?  ?Echo 04/24/21 demonstrated  ? 1. Left ventricular ejection fraction, by estimation, is 65 to 70%. The  ?left ventricle has normal function. The left ventricle has no regional  ?wall motion abnormalities. There is mild concentric left ventricular  ?hypertrophy. Left ventricular diastolic  ?function could not be evaluated.  ? 2. Right ventricular systolic function is normal. The right ventricular  ?size is normal. There is normal pulmonary artery systolic pressure.  The  ?estimated right ventricular systolic pressure is 44.9 mmHg.  ? 3. The mitral valve is degenerative. Mild mitral valve regurgitation. No  ?evidence of mitral stenosis.  ? 4. The aortic valve is tricuspid. T

## 2022-04-08 ENCOUNTER — Ambulatory Visit: Payer: Medicare HMO | Admitting: Physician Assistant

## 2022-04-08 ENCOUNTER — Encounter: Payer: Self-pay | Admitting: Physician Assistant

## 2022-04-08 VITALS — BP 110/76 | HR 80 | Ht 66.0 in | Wt 152.4 lb

## 2022-04-08 DIAGNOSIS — I2510 Atherosclerotic heart disease of native coronary artery without angina pectoris: Secondary | ICD-10-CM

## 2022-04-08 DIAGNOSIS — I480 Paroxysmal atrial fibrillation: Secondary | ICD-10-CM

## 2022-04-08 DIAGNOSIS — I10 Essential (primary) hypertension: Secondary | ICD-10-CM | POA: Diagnosis not present

## 2022-04-08 DIAGNOSIS — E782 Mixed hyperlipidemia: Secondary | ICD-10-CM | POA: Diagnosis not present

## 2022-04-08 DIAGNOSIS — I5032 Chronic diastolic (congestive) heart failure: Secondary | ICD-10-CM | POA: Diagnosis not present

## 2022-04-08 LAB — COMPREHENSIVE METABOLIC PANEL
ALT: 29 IU/L (ref 0–32)
AST: 27 IU/L (ref 0–40)
Albumin/Globulin Ratio: 1.6 (ref 1.2–2.2)
Albumin: 4.3 g/dL (ref 3.7–4.7)
Alkaline Phosphatase: 52 IU/L (ref 44–121)
BUN/Creatinine Ratio: 25 (ref 12–28)
BUN: 18 mg/dL (ref 8–27)
Bilirubin Total: 0.6 mg/dL (ref 0.0–1.2)
CO2: 26 mmol/L (ref 20–29)
Calcium: 9.7 mg/dL (ref 8.7–10.3)
Chloride: 101 mmol/L (ref 96–106)
Creatinine, Ser: 0.71 mg/dL (ref 0.57–1.00)
Globulin, Total: 2.7 g/dL (ref 1.5–4.5)
Glucose: 100 mg/dL — ABNORMAL HIGH (ref 70–99)
Potassium: 4.1 mmol/L (ref 3.5–5.2)
Sodium: 139 mmol/L (ref 134–144)
Total Protein: 7 g/dL (ref 6.0–8.5)
eGFR: 89 mL/min/{1.73_m2} (ref >59)

## 2022-04-08 LAB — LIPID PANEL
Chol/HDL Ratio: 3 ratio (ref 0.0–4.4)
Cholesterol, Total: 133 mg/dL (ref 100–199)
HDL: 44 mg/dL (ref >39)
LDL Chol Calc (NIH): 68 mg/dL (ref 0–99)
Triglycerides: 116 mg/dL (ref 0–149)
VLDL Cholesterol Cal: 21 mg/dL (ref 5–40)

## 2022-04-08 LAB — CBC
Hematocrit: 43.4 % (ref 34.0–46.6)
Hemoglobin: 14.8 g/dL (ref 11.1–15.9)
MCH: 31.6 pg (ref 26.6–33.0)
MCHC: 34.1 g/dL (ref 31.5–35.7)
MCV: 93 fL (ref 79–97)
Platelets: 216 10*3/uL (ref 150–450)
RBC: 4.68 x10E6/uL (ref 3.77–5.28)
RDW: 12.8 % (ref 11.7–15.4)
WBC: 9.8 10*3/uL (ref 3.4–10.8)

## 2022-04-08 LAB — TSH: TSH: 2.51 u[IU]/mL (ref 0.450–4.500)

## 2022-04-08 MED ORDER — APIXABAN 5 MG PO TABS: 5.0000 mg | ORAL_TABLET | Freq: Two times a day (BID) | ORAL | 11 refills | Status: DC

## 2022-04-08 NOTE — Patient Instructions (Signed)
Medication Instructions:  ?Your physician has recommended you make the following change in your medication:  ? ?STOP: Aspirin ?START: Eliquis '5mg'$  twice daily ?Take an extra Lasix today only ? ?*If you need a refill on your cardiac medications before your next appointment, please call your pharmacy* ? ? ?Lab Work: ?TODAY: CMET, CBC, TSH, FLP ? ?If you have labs (blood work) drawn today and your tests are completely normal, you will receive your results only by: ?MyChart Message (if you have MyChart) OR ?A paper copy in the mail ?If you have any lab test that is abnormal or we need to change your treatment, we will call you to review the results. ? ? ?Follow-Up: ?At [facility], you and your health needs are our priority.  As part of our continuing mission to provide you with exceptional heart care, we have created designated Provider Care Teams.  These Care Teams include your primary Cardiologist (physician) and Advanced Practice Providers (APPs -  Physician Assistants and Nurse Practitioners) who all work together to provide you with the care you need, when you need it. ? ?Your next appointment:   ?6 month(s) ? ?The format for your next appointment:   ?In Person ? ?Provider:   ?Candee Furbish, MD  ? ?Follow up with your Neurologist ?  ?

## 2022-04-10 ENCOUNTER — Other Ambulatory Visit: Payer: Self-pay | Admitting: Cardiology

## 2022-04-21 NOTE — Progress Notes (Signed)
? ?NEUROLOGY FOLLOW UP OFFICE NOTE ? ?[name] ?[id number] ? ?Assessment/Plan:  ? 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  However, I am concerned about possible Alzheimer's disease as well.  There has been a slight decline on SLUMS. ?Cervical myelopathy s/p fusion ? ?  ?1  Start donepezil '5mg'$  at bedtime for 4 weeks, followed by '10mg'$  at bedtime ?2  Check B12 ?3  Check neuropsychological evaluation ?4  Follow up in 6 months. ?  ?Subjective:  ?[name] is a 76 year old right-handed female with  Recently diagnosed noninvasive breast cancer who follows up for postconcussion syndrome and myelopathy.  She is accompanied by her husband who supplements history.   ?  ?UPDATE: ?Last seen in November.  At that time, patient said that her postconcussion symptoms appeared to have improved.  However her husband states that her memory has never improved but hasn't gotten worse either.  She reports word-finding difficulty.  Needing to write things down.  Able to recall her grandchildren's names but helps if she looks at their picture.   ? ?HISTORY: ?On 06/30/2020, she tripped and fell in the bathroom at church, hitting her head on the bathroom stall sustaining a scalp laceration.  She was seen in Urgent Care where she and her spouse reported that she did not lose consciousness and denied headache, blurred vision, nausea, vomiting, dizziness or lightheadedness.  When she returned to Urgent Care on 07/10/2020 for suture removal, she still denied headache. However, she has reported lightheadedness and feeling dizzy particularly in changing positions.  She is therefore scared to drive.  She reports problems with memory, having to write things down.  She reports word-finding issues and speech is slurred.  She also has trouble writing.  Handwriting is smaller.  It takes prolonged period of time to write things down.  She is still able to pay the bills but takes a while to complete task.  She also cries  easily.  She has been using a cane as she has been unsteady. There has been no improvement in symptoms but they have not gotten worse either.  MRI of brain with and without contrast on 09/06/2020 showed mild generalized cerebral atrophy and chronic small vessel ischemic changes but no acute intracranial abnormality.  Noted to have UMN signs on exam, such as hyperreflexia, Hoffman and Babinski signs.  MRI of cervical spine with and without contrast on 12/27/2020 personally reviewed and showed cord compression at C4-5 and C5-6.  She was referred to neurosurgery and underwent ACDF at those levels in May 2022.  She is in rehab.  She is doing much better.  They will be working on balance next.  Speech has cleared up.  Cognition improved.  She may need a moment to gather her thoughts of what she wants to say.  Emotional lability has improved.   ?  ?  ?She hit her head after tripping on the driveway in 1448 but no concussion or LOC with that event ? ?PAST MEDICAL HISTORY: ?Past Medical History:  ?Diagnosis Date  ? Allergic rhinitis   ? Breast cancer ([place]) 10/17/2020  ? CAD (coronary artery disease)   ? , s/p CAGB 01/2011- LIMA to LAD patent, all other vein grafts occluded. Circumflex DES 3/13 placed following lateral ischemia on nuclear stress test  ? Cervical spondylosis   ? , bulging  discs  ? Disc herniation   ? L4-L5, L5-S1  ? Family history of breast cancer 11/18/2020  ? Hypertension   ?  Mixed hyperlipidemia   ? Obesity   ? Post concussion syndrome   ? processes information slowly,   ? Postmenopausal   ? Prediabetes   ? ? ?MEDICATIONS: ?Current Outpatient Medications on File Prior to Visit  ?Medication Sig Dispense Refill  ? acetaminophen (TYLENOL) 500 MG tablet Take 500-1,000 mg by mouth every 6 (six) hours as needed (pain).    ? apixaban (ELIQUIS) 5 MG TABS tablet Take 1 tablet (5 mg total) by mouth 2 (two) times daily. 60 tablet 11  ? atenolol (TENORMIN) 50 MG tablet Take 1 tablet (50 mg total) by mouth daily. 90  tablet 2  ? B Complex-C-E-Zn (EQL STRESS B-COMPLEX C/ZINC) TABS take 1 tablet by oral route every day    ? cephALEXin (KEFLEX) 500 MG capsule Take 500 mg by mouth 4 (four) times daily. 3x/day for 7 days    ? COVID-19 mRNA Vac-TriS, Pfizer, SUSP injection Inject into the muscle. 0.3 mL 0  ? furosemide (LASIX) 20 MG tablet Take 1 tablet (20 mg total) by mouth daily. 90 tablet 0  ? ibuprofen (ADVIL) 200 MG tablet 1 tablet with food or milk as needed    ? ketoconazole (NIZORAL) 2 % cream Apply 1 application topically daily. 15 g 4  ? Multiple Vitamin (MULTIVITAMIN WITH MINERALS) TABS tablet Take 1 tablet by mouth daily.    ? Potassium Gluconate 550 MG TABS take 1 tablet by oral route every day    ? rosuvastatin (CRESTOR) 20 MG tablet Take 1 tablet (20 mg total) by mouth daily. 90 tablet 0  ? ?No current facility-administered medications on file prior to visit.  ? ? ?ALLERGIES: ?Allergies  ?Allergen Reactions  ? Norvasc [Amlodipine Besylate] Other (See Comments)  ?  Edema ?  ? Latex Itching and Rash  ? Lipitor [Atorvastatin] Other (See Comments)  ?  Muscle aches  ? Lisinopril Swelling and Cough  ? Penicillins Rash  ?  Tolerated Ancef 2g IV on 02/19/21  ? Sulfa Antibiotics Rash  ? Tape Itching and Rash  ? Tetanus Toxoids Other (See Comments)  ?  Patient said "Didn't do well".  ? ? ?FAMILY HISTORY: ?Family History  ?Problem Relation Age of Onset  ? Heart disease Mother   ? Hypertension Mother   ? Heart attack Father   ? Heart disease Brother   ? Heart disease Brother   ? Breast cancer Paternal Grandmother   ?     dx unknown age  ? Breast cancer Paternal Aunt   ?     dx unknown age  ? ? ?  ?Objective:  ?Blood pressure 134/77, pulse 69, height '5\' 6"'$  (1.676 m), weight 154 lb (69.9 kg), SpO2 98 %. ?General: No acute distress.  Patient appears well-groomed.   ?Head:  Normocephalic/atraumatic ?Eyes:  Fundi examined but not visualized ?Neck: supple, no paraspinal tenderness, full range of motion ?Heart:  Regular rate and  rhythm ?Lungs:  Clear to auscultation bilaterally ?Back: No paraspinal tenderness ?Neurological Exam:  ? ?  12/02/2020  ?  9:00 AM 04/22/2022  ? 11:00 AM  ?[employer] Mental Exam  ?Weekday Correct 1 1  ?Current year 1 1  ?What state are we in? 1 1  ?Amount spent 1 0  ?Amount left 0 0  ?# of Animals 2 1  ?5 objects recall 3 2  ?Number series 2 2  ?Hour markers 2 0  ?Time correct 0 0  ?Placed X in triangle correctly 1 1  ?Largest Figure 1 1  ?Name  of female 0 2  ?Date back to work 0 0  ?Type of work 2 0  ?State she lived in 0 2  ?Total score 17 14  ? ?CN II-XII intact. Bulk and tone normal, muscle strength 5/5 throughout.  Sensation to light touch intact.  Deep tendon reflexes 3+ throughout.  Finger to nose testing intact.  Unsteady broad-based gait, requiring use of walker. ? ? ?Metta Clines, DO ? ?CC: Orpah Melter, MD ? ? ? ? ? ? ?

## 2022-04-22 ENCOUNTER — Other Ambulatory Visit (INDEPENDENT_AMBULATORY_CARE_PROVIDER_SITE_OTHER): Payer: Medicare HMO

## 2022-04-22 ENCOUNTER — Ambulatory Visit: Payer: Medicare HMO | Admitting: Neurology

## 2022-04-22 ENCOUNTER — Encounter: Payer: Self-pay | Admitting: Neurology

## 2022-04-22 VITALS — BP 134/77 | HR 69 | Ht 66.0 in | Wt 154.0 lb

## 2022-04-22 DIAGNOSIS — G3184 Mild cognitive impairment, so stated: Secondary | ICD-10-CM | POA: Diagnosis not present

## 2022-04-22 LAB — VITAMIN B12: Vitamin B-12: 1210 pg/mL — ABNORMAL HIGH (ref 211–911)

## 2022-04-22 MED ORDER — DONEPEZIL HCL 5 MG PO TABS: 5.0000 mg | ORAL_TABLET | Freq: Every day | ORAL | 0 refills | Status: DC

## 2022-04-22 NOTE — Patient Instructions (Signed)
I want to check for other causes of memory problems.  ?We will start donepezil (Aricept) '5mg'$  daily for four weeks.  If you are tolerating the medication, then after four weeks, we will increase the dose to '10mg'$  daily.  Side effects include nausea, vomiting, diarrhea, vivid dreams, and muscle cramps.  Please call the clinic if you experience any of these symptoms. ?We will check a vitamin B12 level ?We will order neuropsychological evaluation ?Follow up in 6 months.   ?

## 2022-04-24 NOTE — Progress Notes (Signed)
Unable to leave message at 4:22 04/24/2022

## 2022-04-28 NOTE — Progress Notes (Signed)
Tried calling patient, No answer. Unable to LVM.

## 2022-05-11 DIAGNOSIS — M1990 Unspecified osteoarthritis, unspecified site: Secondary | ICD-10-CM | POA: Diagnosis not present

## 2022-05-11 DIAGNOSIS — I4891 Unspecified atrial fibrillation: Secondary | ICD-10-CM | POA: Diagnosis not present

## 2022-05-11 DIAGNOSIS — I4892 Unspecified atrial flutter: Secondary | ICD-10-CM | POA: Diagnosis not present

## 2022-05-11 DIAGNOSIS — Z880 Allergy status to penicillin: Secondary | ICD-10-CM | POA: Diagnosis not present

## 2022-05-11 DIAGNOSIS — D6869 Other thrombophilia: Secondary | ICD-10-CM | POA: Diagnosis not present

## 2022-05-11 DIAGNOSIS — I10 Essential (primary) hypertension: Secondary | ICD-10-CM | POA: Diagnosis not present

## 2022-05-11 DIAGNOSIS — Z791 Long term (current) use of non-steroidal anti-inflammatories (NSAID): Secondary | ICD-10-CM | POA: Diagnosis not present

## 2022-05-11 DIAGNOSIS — Z7901 Long term (current) use of anticoagulants: Secondary | ICD-10-CM | POA: Diagnosis not present

## 2022-05-11 DIAGNOSIS — Z8249 Family history of ischemic heart disease and other diseases of the circulatory system: Secondary | ICD-10-CM | POA: Diagnosis not present

## 2022-05-11 DIAGNOSIS — Z853 Personal history of malignant neoplasm of breast: Secondary | ICD-10-CM | POA: Diagnosis not present

## 2022-05-11 DIAGNOSIS — I2510 Atherosclerotic heart disease of native coronary artery without angina pectoris: Secondary | ICD-10-CM | POA: Diagnosis not present

## 2022-05-11 DIAGNOSIS — G3184 Mild cognitive impairment, so stated: Secondary | ICD-10-CM | POA: Diagnosis not present

## 2022-05-14 ENCOUNTER — Other Ambulatory Visit: Payer: Self-pay | Admitting: Neurology

## 2022-05-18 ENCOUNTER — Other Ambulatory Visit: Payer: Self-pay | Admitting: Cardiology

## 2022-05-27 ENCOUNTER — Encounter: Payer: Self-pay | Admitting: Psychology

## 2022-06-15 DIAGNOSIS — H04123 Dry eye syndrome of bilateral lacrimal glands: Secondary | ICD-10-CM | POA: Diagnosis not present

## 2022-06-26 DIAGNOSIS — I10 Essential (primary) hypertension: Secondary | ICD-10-CM | POA: Diagnosis not present

## 2022-06-26 DIAGNOSIS — I480 Paroxysmal atrial fibrillation: Secondary | ICD-10-CM | POA: Diagnosis not present

## 2022-06-26 DIAGNOSIS — R29898 Other symptoms and signs involving the musculoskeletal system: Secondary | ICD-10-CM | POA: Diagnosis not present

## 2022-06-26 DIAGNOSIS — E782 Mixed hyperlipidemia: Secondary | ICD-10-CM | POA: Diagnosis not present

## 2022-06-26 DIAGNOSIS — I2510 Atherosclerotic heart disease of native coronary artery without angina pectoris: Secondary | ICD-10-CM | POA: Diagnosis not present

## 2022-06-26 DIAGNOSIS — E669 Obesity, unspecified: Secondary | ICD-10-CM | POA: Diagnosis not present

## 2022-06-26 DIAGNOSIS — Z111 Encounter for screening for respiratory tuberculosis: Secondary | ICD-10-CM | POA: Diagnosis not present

## 2022-07-03 ENCOUNTER — Encounter: Payer: Self-pay | Admitting: Psychology

## 2022-07-03 DIAGNOSIS — M5430 Sciatica, unspecified side: Secondary | ICD-10-CM | POA: Insufficient documentation

## 2022-07-03 DIAGNOSIS — N3946 Mixed incontinence: Secondary | ICD-10-CM | POA: Insufficient documentation

## 2022-07-03 DIAGNOSIS — H35039 Hypertensive retinopathy, unspecified eye: Secondary | ICD-10-CM | POA: Insufficient documentation

## 2022-07-03 DIAGNOSIS — E669 Obesity, unspecified: Secondary | ICD-10-CM | POA: Insufficient documentation

## 2022-07-06 ENCOUNTER — Telehealth: Payer: Self-pay

## 2022-07-06 ENCOUNTER — Ambulatory Visit: Payer: Medicare HMO | Admitting: Psychology

## 2022-07-06 ENCOUNTER — Encounter: Payer: Self-pay | Admitting: Psychology

## 2022-07-06 DIAGNOSIS — R69 Illness, unspecified: Secondary | ICD-10-CM | POA: Diagnosis not present

## 2022-07-06 DIAGNOSIS — F03A2 Unspecified dementia, mild, with psychotic disturbance: Secondary | ICD-10-CM

## 2022-07-06 DIAGNOSIS — F05 Delirium due to known physiological condition: Secondary | ICD-10-CM | POA: Diagnosis not present

## 2022-07-06 DIAGNOSIS — F03A0 Unspecified dementia, mild, without behavioral disturbance, psychotic disturbance, mood disturbance, and anxiety: Secondary | ICD-10-CM | POA: Insufficient documentation

## 2022-07-06 DIAGNOSIS — R4189 Other symptoms and signs involving cognitive functions and awareness: Secondary | ICD-10-CM

## 2022-07-06 HISTORY — DX: Unspecified dementia, mild, without behavioral disturbance, psychotic disturbance, mood disturbance, and anxiety: F03.A0

## 2022-07-06 NOTE — Telephone Encounter (Signed)
**Note De-Identified  Obfuscation** Providers page of a BMSPAF application for Eliquis received from Lelon Huh, CPhT with [facility] with a request for [place] to complete and to fax back to her at [phone].   I have completed the providers page and have e-mailed it to the nurse covering Ermalinda Barrios, PA-c tomorrow so she can obtain her signature and to fax back to [place] at the fax number written on the cover letter included.

## 2022-07-06 NOTE — Progress Notes (Signed)
NEUROPSYCHOLOGICAL EVALUATION [place] [place] Department of Neurology  Date of Evaluation: July 06, 2022  Reason for Referral:   Tiyana Galla is a 76 y.o. right-handed Caucasian female referred by Metta Clines, D.O., to characterize her current cognitive functioning and assist with diagnostic clarity and treatment planning in the context of prior concussion history and concern for progressive cognitive decline.   Assessment and Plan:   Clinical Impression(s): Ms. Snooks pattern of performance is suggestive of primary impairments surrounding processing speed, executive functioning, and phonemic fluency. Additional performance variability (but with scores trending towards impairments overall) was exhibited across attention/concentration, semantic fluency, confrontation naming, visuospatial abilities, and all aspects of learning and memory. Her sole consistently intact performance was across a task assessing safety and judgment. Ms. Coke daughter-in-law described a notable decline in functioning over the past 2.5-3 years. Ms. Hiers acknowledged making some mistakes with medication management and has given financial and bill paying responsibilities to her husband and other family members. Given evidence for ongoing cognitive and functional impairment, she meets diagnostic criteria for a Major Neurocognitive Disorder ("dementia") at the present time. Testing would suggest mild-moderate staging.   The underlying etiology is quite challenging to decipher based upon current testing and overall unclear. I cannot rule out an underlying Lewy body dementia presentation. Her pattern across testing reasonably aligns with this illness (however, more consistent visuospatial impairment would be more typical). There has also been report of some REM sleep behaviors, gait/balance disturbances, fluctuations in alertness, and visual hallucinations, all of which are commonly seen in  this illness. However, per Ms. Denning, at least some of these behaviors/symptoms were attributed to medication side effects, particularly donepezil/Aricept. Also along the parkinsonian spectrum, I cannot rule out progressive supranuclear palsy (PSP) given testing patterns, gait changes, and frequent falling behaviors. However, her brain MRI did not reveal advanced midbrain atrophy and there is no mention of oculomotor gaze palsy in her medical records, making this less likely (and already far more rare when compared to Lewy body disease). I do not see compelling behavioral evidence to suggest ongoing corticobasal generation or multisystem atrophy.   Alzheimer's disease does not appear particularly compelling at the present time. Memory certainly exhibited variability and some weakness across testing. However, normatively, delayed memory represented a relative strength relative to initial learning efforts. As delayed memory impairment is generally the initial and most profound impairment early on in Alzheimer's disease, current patterns would be atypical of this illness given that other cognitive domains appear to have been affected earlier and more severely relatively speaking. Additionally, Ms. Takeda was able to recall visual memory well and also performed well across both list and figure recognition trials. This does not firmly suggest a primary storage impairment, which would also be atypical with this disease process. It is important to highlight that Alzheimer's can co-occur with other neurological disease processes (including Lewy body disease). An ongoing Alzheimer's disease presence remains plausible. However, it would seem less likely that a typically presenting version of this illness is the primary driver of progressive decline at the present time.  I do not believe that her July 2021 fall and potential subsequent concussion are playing a strong or active role at the present time. Likely, I do not  believe her going under general anesthesia for a prior neck procedure is the cause of ongoing impairment. Testing and behavioral reports would favor other etiologies over frontotemporal dementia. Her most recent brain MRI (2021) revealed mild microvascular ischemic changes, making  a vascular dementia presentation unlikely. Continued medical monitoring will be important moving forward.  Recommendations: A repeat neuropsychological evaluation in 12-18 months (or sooner if functional decline is noted) is recommended to assess the trajectory of future cognitive decline should it occur. This will also aid in future efforts towards improved diagnostic clarity.  Ms. Neyra attributed her periods of seeming "loopy" and some behavioral symptoms (i.e., acting out dreams, hallucinations) to side effects of an increased dose of donepezil (moving from 5 mg to 14m). However, they may simply reflect sundowning and/or ongoing neurological decline due to an underlying disease process. I would recommend that she discuss going off this medication in favor of one of its alternatives with Dr. JTomi Likens It will then be important for her and her family to monitor for symptom improvement or persistence.   I would recommend consideration of additional neuroimaging, particularly in the form of a PET scan. While a DaTscan could also be considered given concerns surrounding Lewy body disease, A PET scan can could theoretically help differentiate hypometabolic patterns consistent with both Alzheimer's disease and Lewy body disease. These scans should be discussed with Dr. JTomi Likens   Ms. LWeimerdaughter-in-law stated that they have been considering assisted living given evidence for functional decline. I would agree with this sentiment and would encourage them to more strongly consider various placements. Per her daughter-in-law, they have encountered some resistance in that most memory care facilities will not allow her husband to reside  with her. If the choice is between a memory care facility alone or an assisted living facility which she can reside in her with her husband, her and her family opting for the latter seems quite reasonable to me.   A referral for outpatient physical therapy could also be considered given changes in her gait and her daughter-in-law's concern for physical deconditioning.   Performance across neurocognitive testing is not a strong predictor of an individual's safety operating a motor vehicle. However, I agree with Ms. LHaynieremote decision to abstain from driving due to processing speed concerns, as well as ongoing cognitive decline. Should she or her family wish to pursue a formalized driving evaluation, they could reach out to the following agencies: [employer] [phone]: [facility] [address] [phone] [phone] Ms. LCowensis encouraged to attend to lifestyle factors for brain health (e.g., regular physical exercise, good nutrition habits, regular participation in cognitively-stimulating activities, and general stress management techniques), which are likely to have benefits for both emotional adjustment and cognition. Optimal control of vascular risk factors (including safe cardiovascular exercise and adherence to dietary recommendations) is encouraged. Continued participation in activities which provide mental stimulation and social interaction is also recommended.   Important information should be provided to Ms. Fausto in written format in all instances. This information should be placed in a highly frequented and easily visible location within her home to promote recall. External strategies such as written notes in a consistently used memory journal, visual and nonverbal auditory cues such as a calendar on the refrigerator or appointments with alarm, such as on a cell phone, can  also help maximize recall.  To address problems with processing speed, she may wish to consider:   -Ensuring that she is alerted when essential material or instructions are being presented   -Adjusting the speed at which new information is presented   -Allowing for more time in comprehending, processing, and responding in conversation  To address problems  with fluctuating attention and executive dysfunction, she may wish to consider:   -Avoiding external distractions when needing to concentrate   -Limiting exposure to fast paced environments with multiple sensory demands  -Writing down complicated information and using checklists   -Attempting and completing one task at a time (i.e., no multi-tasking)   -Verbalizing aloud each step of a task to maintain focus   -Taking frequent breaks during the completion of steps/tasks to avoid fatigue  -Reducing the amount of information considered at one time  Review of Records:   Ms. Mario was seen by [facility] Metta Clines, D.O.) on 12/02/2020 for an evaluation of post-concussion syndrome. Briefly, Ms. Wessell reportedly tripped and fell while in the bathroom at church on 06/30/2020, ultimately hitting her head on the bathroom stall and sustaining a scalp laceration. She was seen in Urgent Care where she and her spouse denied a loss in consciousness, as well as ongoing headache, blurred vision, nausea, vomiting, dizziness, or lightheadedness. These symptoms largely continued to be denied when she returned on 07/10/2020 for suture removal. However, upon her return, she did report some dizziness while changing positions. More broadly, she also reported trouble with short-term memory and word finding. No significant ADL dysfunction was reported at that time. Performance on a brief cognitive screening instrument (SLUMS) was 17/30.  She was most recently seen by Dr. Tomi Likens on 04/22/2022. Likely post-concussion symptoms were said to be gradually improving.  However, progressive memory decline and word finding difficulties were said to have remained. Ultimately, Ms. Catanzaro was referred for a comprehensive neuropsychological evaluation to characterize her cognitive abilities and to assist with diagnostic clarity and treatment planning.   Brain MRI on 09/06/2020 revealed mild generalized atrophy and mild microvascular ischemic changes. No acute intracranial abnormalities were revealed. No other neuroimaging was available for review.   Past Medical History:  Diagnosis Date   Allergic rhinitis    Cervical myelopathy 04/23/2021   Cervical spondylosis    bulging discs   Coronary artery disease    s/p CAGB 01/2011- LIMA to LAD patent, all other vein grafts occluded. Circumflex DES 3/13 placed following lateral ischemia on nuclear stress test   Coronary atherosclerosis of native coronary artery 02/05/2012   CAD - CABG 2/12 - LIMA-LAD, S-D, S-OM, S-PDA Roxan Hockey) NUC - 2/13 - lateral ischemia CATH- 02/05/12 - all 3 SVG grafts were down. LIMA patent. Successful PCI of the left circumflex with overlapping Promus drug-eluting stents, 2.5 x 38 and 2.75 x 16. The stented area was postdilated to greater than 3 mm in the mid and proximal stented portion. 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  The report date is November 30, 2020.   The Common Hereditary Cancers Panel offered by [facility] includes sequencing and/or deletion duplication testing of the following 48 genes: APC, ATM, AXIN2, BARD1, BMPR1A, BRCA1, BRCA2, BRIP1, CDH1, CDK4, CDKN2A (p14ARF), CDKN2A (p16INK4a), CHEK2, CTNNA1, DIC   Hypertensive retinopathy     Mixed hyperlipidemia    Mixed incontinence    Obesity    Paroxysmal atrial fibrillation 05/15/2021   Post concussion syndrome    Postmenopausal    Prediabetes    Sciatica  Secondary hypercoagulable state 05/15/2021    Past Surgical History:  Procedure Laterality Date   ABDOMINAL HYSTERECTOMY     ANTERIOR CERVICAL DECOMP/DISCECTOMY FUSION N/A 04/23/2021   Procedure: Anterior Cervical Discectomy Fusion - Cervical Four- Cervical Five - Cervical Five -Cervical Six;  Surgeon: Kary Kos, MD;  Location: [place];  Service: Neurosurgery;  Laterality: N/A;  Anterior Cervical Discectomy Fusion - Cervical Four- Cervical Five - Cervical Five -Cervical Six   Bone Spur Left    Foot   BREAST BIOPSY     BREAST ENHANCEMENT SURGERY     BREAST IMPLANT EXCHANGE     replaced rupture implant   BREAST LUMPECTOMY WITH RADIOACTIVE SEED LOCALIZATION Right 12/11/2020   Procedure: RIGHT BREAST LUMPECTOMY WITH RADIOACTIVE SEED LOCALIZATION;  Surgeon: Jovita Kussmaul, MD;  Location: [place];  Service: General;  Laterality: Right;   CARDIAC CATHETERIZATION     COLONOSCOPY     CORONARY ARTERY BYPASS GRAFT     JOINT REPLACEMENT     right total knee replacement   Left Bunionectomy     LEFT HEART CATHETERIZATION WITH CORONARY/GRAFT ANGIOGRAM  02/05/2012   Procedure: LEFT HEART CATHETERIZATION WITH Beatrix Fetters;  Surgeon: Candee Furbish, MD;  Location: [facility] CATH [facility];  Service: Cardiovascular;;   MASTECTOMY W/ SENTINEL NODE BIOPSY Right 02/19/2021   Procedure: RIGHT MASTECTOMY;  Surgeon: Jovita Kussmaul, MD;  Location: [place];  Service: General;  Laterality: Right;  RNFA, PEC BLOCK   PARTIAL HYSTERECTOMY     RE-EXCISION OF BREAST CANCER,SUPERIOR MARGINS Right 01/15/2021   Procedure: RE-EXCISION RIGHT BREAST MEDIAL MARGIN;  Surgeon: Jovita Kussmaul, MD;  Location: [place];  Service: General;  Laterality: Right;   REPLACEMENT TOTAL KNEE Right    SENTINEL NODE BIOPSY Right 02/19/2021    Procedure: SENTINEL LYMPH NODE BIOPSY;  Surgeon: [name], MD;  Location: [place];  Service: General;  Laterality: Right;   TONSILLECTOMY     TUBAL LIGATION Bilateral     Current Outpatient Medications:    acetaminophen (TYLENOL) 500 MG tablet, Take 500-1,000 mg by mouth every 6 (six) hours as needed (pain)., Disp: , Rfl:    apixaban (ELIQUIS) 5 MG TABS tablet, Take 1 tablet (5 mg total) by mouth 2 (two) times daily., Disp: 60 tablet, Rfl: 11   atenolol (TENORMIN) 50 MG tablet, Take 1 tablet (50 mg total) by mouth daily., Disp: 90 tablet, Rfl: 2   B Complex-C-E-Zn (EQL STRESS B-COMPLEX C/ZINC) TABS, take 1 tablet by oral route every day, Disp: , Rfl:    cephALEXin (KEFLEX) 500 MG capsule, Take 500 mg by mouth 4 (four) times daily. 3x/day for 7 days (Patient not taking: Reported on 04/22/2022), Disp: , Rfl:    COVID-19 mRNA Vac-TriS, Pfizer, SUSP injection, Inject into the muscle., Disp: 0.3 mL, Rfl: 0   donepezil (ARICEPT) 10 MG tablet, Take 1 tablet (10 mg total) by mouth at bedtime., Disp: 30 tablet, Rfl: 5   furosemide (LASIX) 20 MG tablet, TAKE 1 TABLET BY MOUTH EVERY DAY, Disp: 90 tablet, Rfl: 3   ibuprofen (ADVIL) 200 MG tablet, 1 tablet with food or milk as needed, Disp: , Rfl:    ketoconazole (NIZORAL) 2 % cream, Apply 1 application topically daily., Disp: 15 g, Rfl: 4   Multiple Vitamin (MULTIVITAMIN WITH MINERALS) TABS tablet, Take 1 tablet by mouth daily., Disp: , Rfl:    Potassium Gluconate 550 MG TABS, take 1 tablet by oral route every day, Disp: , Rfl:  rosuvastatin (CRESTOR) 20 MG tablet, TAKE 1 TABLET BY MOUTH EVERY DAY, Disp: 90 tablet, Rfl: 3  Clinical Interview:   The following information was obtained during a clinical interview with Ms. Lightsey and her daughter-in-law prior to cognitive testing.  Cognitive Symptoms: Decreased short-term memory: Endorsed. Ms. Brier reported primary difficulties quickly forgetting details of past conversations. She also reported  trouble recalling names of familiar individuals. Her daughter-in-law agreed, also stating that Ms. Srey will repeat herself and ask repetitive questions frequently. Per their report, progressive cognitive decline has been present for at least the past 18 months.  Decreased long-term memory: Denied. Decreased attention/concentration: Endorsed. She reported difficulty with sustained attention and increased distractibility.  Reduced processing speed: Endorsed. Difficulties with executive functions: Denied. They also denied trouble with impulsivity or any significant personality changes.  Difficulties with emotion regulation: Denied. Difficulties with receptive language: Denied. Difficulties with word finding: Endorsed. Decreased visuoperceptual ability: Denied.  Difficulties completing ADLs: Endorsed. She continues to manage her medications independently. However, her daughter-in-law reported more frequent instances where she will forget to take her medications. Her husband has taken over financial management and bill paying responsibilities; however, her daughter-in-law and son are also very involved in this as well. She has not driven since undergoing a prior neck procedure. She reported trouble turning her head, which ultimately led to her to make the decision to stop driving. She also reported diminished reaction time as a concern. More broadly, her daughter-in-law described a very significant and fairly rapid functional decline occurring during the past 2.5-3 years.   Additional Medical History: History of traumatic brain injury/concussion: Endorsed. In addition to her July 2021 event described above, Dr. Georgie Chard notes also mention a fall where she hit her head after tripping in her driveway in December 2020. No loss of consciousness or diagnosed concussion occurred with that event.  History of stroke: Denied. History of seizure activity: Denied. History of known exposure to toxins:  Denied. Symptoms of chronic pain: Denied. Experience of frequent headaches/migraines: Denied. Lately however, she reported onset of headache symptoms on the top of the head near the frontal regions where she hit her head during a prior fall. These were said to occur a couple times per month.  Frequent instances of dizziness/vertigo: Denied.  Sensory changes: She wears glasses with benefit. Other sensory changes/difficulties (hearing, taste, smell) were denied.  Balance/coordination difficulties: Endorsed. As stated above, Ms. Tingley has had several falls over the years. She did not report significant weakness in her legs or concern for syncopal episodes. Her daughter-in-law did wonder about physical deconditioning and leg weakness as a potential cause. While they reported that Ms. Stetler has always been somewhat clumsy, her daughter-in-law noted that falls generally occur when Ms. Murty has to execute a turn where she loses her balance.  Other motor difficulties: Endorsed. However, Ms. Wardrip was vague. She ultimately described very mild tremors who occur intermittently in her hands. One side of the body was not said to be worse than the other.   Other medical conditions: Denied.  Sleep History: Estimated hours obtained each night: 6 hours.  Difficulties falling asleep: Denied. Difficulties staying asleep: Denied. However, her daughter-in-law did note that a prior family member observed Ms. Principato get up in the middle of the night and perform other actions around the house. She reported some wandering behaviors at night in response.  Feels rested and refreshed upon awakening: Endorsed. It is worth highlighting that her daughter-in-law reported potential sundowning in that Ms. Piontek commonly  describes feeling "loopy" in the later afternoon (starting around 4-6:00pm). During this time, all parties described her as being less sharp and more easily confused.   History of snoring: Denied. History of  waking up gasping for air: Denied. Witnessed breath cessation while asleep: Denied.  History of vivid dreaming: Endorsed. Excessive movement while asleep: Endorsed. However, it was theorized that this could be related to medication side effects (donepezil) as these symptoms appeared more prominent since this medication was increased to 65m.  Instances of acting out her dreams: Endorsed (see above). Ms. LKanaanalso described a story where she swung her arm while asleep and knocked a picture frame off a nearby wall.   Psychiatric/Behavioral Health History: Depression: Denied. She also denied to her knowledge ever being formally diagnosed with a mental health condition in the past. Current or remote suicidal ideation, intent, or plan was denied.  Anxiety: Denied. However, her daughter-in-law did describe some acute symptoms surrounding Ms. Broerman knowing that "something isn't right." This will yield symptoms of fear and frustration.  Mania: Denied. Trauma History: Denied. Visual/auditory hallucinations: Unclear. Some hallucinations/delusions were reported following a prior neck procedure and potentially due to acute medication side effects. These experiences surrounded seeing unspecified things or feeling as though she was upside down while in the hospital. However, within the past week, she reported believing that several small figurines were moving and dancing. This was said to occur during one of her "loopy" periods in the later afternoon.  Delusional thoughts: See above.   Tobacco: Denied. Alcohol: She reported very rare alcohol consumption and denied a history of problematic alcohol abuse or dependence.  Recreational drugs: Denied.  Family History: Problem Relation Age of Onset   Heart disease Mother    Hypertension Mother    Heart attack Father    Alcoholism Father    Heart disease Brother    Heart disease Brother    Breast cancer Paternal Grandmother        dx unknown age   ADD /  ADHD Son    ADD / ADHD Son    Breast cancer Paternal Aunt        dx unknown age   This information was confirmed by Ms. Purnell.  Academic/Vocational History: Highest level of educational attainment: 14 years. She graduated from high school and completed an Associate's degree. 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 No relative weaknesses were identified.  History of developmental delay: Denied. History of grade repetition: Denied. Enrollment in special education courses: Denied. History of LD/ADHD: Denied.  Employment: Retired. 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   Evaluation Results:   Behavioral Observations: Ms. LIshibashiwas accompanied her daughter-in-law, arrived to her appointment on time, and was appropriately dressed and groomed. She appeared alert. She ambulated slowly with the assistance of a rolling walker but did not exhibit significant balance instability. Gross motor functioning appeared intact upon informal observation and no abnormal movements (e.g., tremors) were noted. Her affect was generally relaxed and positive, but did range appropriately given the subject being discussed during the clinical interview or the task at hand during testing procedures. Spontaneous speech was slowed but overall fluent and word finding difficulties were not observed during the clinical interview. Thought processes also appeared  slowed but were coherent, organized, and normal in content. Insight into her cognitive difficulties appeared largely adequate. However, the extent of ongoing impairment may be greater than what Ms. Lepp is able to appreciate.   During testing, she appeared very slow to process information and  very slow when providing responses. Sustained attention was appropriate. Task engagement was adequate and she persisted when challenged. She was noted to forget task instructions from time to time. For example, when completing a task requiring her to provide words that begin with a specific letter, she would forget these letters midway through. No tremors were observed during testing, including when performing tasks with a motor requirement. Overall, Ms. Fellner was cooperative with the clinical interview and subsequent testing procedures.   Adequacy of Effort: The validity of neuropsychological testing is limited by the extent to which the individual being tested may be assumed to have exerted adequate effort during testing. Ms. Ek expressed her intention to perform to the best of her abilities and exhibited adequate task engagement and persistence. Scores across stand-alone and embedded performance validity measures were variable. While this could suggest variable ability to focus on the tasks at hand, it also could suggest notable cognitive impairment. As test scores reflect functioning reported by Ms. Taddei and her daughter-in-law, the results of the current evaluation are believed to be a valid representation of Ms. Mccluskey's current cognitive functioning. However, some caution with interpretation is prudent.   Test Results: Ms. Knee was mildly disoriented at the time of the current evaluation. She incorrectly stated her age ("71"). She was also unable to state the current time or name the current clinic.   Intellectual abilities based upon educational and vocational attainment were estimated to be in the average range. Premorbid abilities were estimated to be within the average range based upon a single-word reading test.   Processing speed was largely exceptionally low. Basic attention was variable, ranging from the well below average to average normative ranges. More complex attention (e.g.,  working memory) was exceptionally low to below average. Executive functioning was exceptionally low to well below average. She did perform in the above average range across a task assessing safety and judgment.   Assessed receptive language abilities were well below average. However, points were also exclusively lost on this task due to trouble following and remembering multi-step commands. She generally did not exhibit any difficulties comprehending task instructions and answered all questions asked of her appropriately during interview. Assessed expressive language was variable. Sentence repetition was exceptionally low, phonemic fluency was exceptionally low, semantic fluency was exceptionally low to below average, and confrontation naming was well below average to average.      Assessed visuospatial/visuoconstructional abilities were variable, ranging from the exceptionally low to average normative ranges. Points were lost on her drawing of a clock due to mild errors in numerical spacing, as well as incorrect hand placement. Points were lost on her copy of a complex figure due to several visual distortions and some aspects being omitted entirely.     Learning (i.e., encoding) of novel verbal information was well below average to below average. Spontaneous delayed recall (i.e., retrieval) of previously learned information was average across a visual task but exceptionally low to well below average across verbal tasks. Retention rates were 60% (raw score of 3) across a story learning task, 0% across a list learning task, and 100% across a figure drawing task. Performance across recognition tasks was well below average across a story task but average across  list and figure tasks, suggesting some evidence for information consolidation.   Results of emotional screening instruments suggested that recent symptoms of generalized anxiety were in the minimal range, while symptoms of depression were within the mild  range. A screening instrument assessing recent sleep quality suggested the presence of minimal sleep dysfunction.  Tables of Scores:   Note: This summary of test scores accompanies the interpretive report and should not be considered in isolation without reference to the appropriate sections in the text. Descriptors are based on appropriate normative data and may be adjusted based on clinical judgment. Terms such as "Within Normal Limits" and "Outside Normal Limits" are used when a more specific description of the test score cannot be determined.       Percentile - Normative Descriptor > 98 - Exceptionally High 91-97 - Well Above Average 75-90 - Above Average 25-74 - Average 9-24 - Below Average 2-8 - Well Below Average < 2 - Exceptionally Low       Validity:   DESCRIPTOR       Dot Counting Test: --- --- Outside Normal Limits  RBANS Effort Index: --- --- Within Normal Limits  WAIS-IV Reliable Digit Span: --- --- Outside Normal Limits       Orientation:      Raw Score Percentile   NAB Orientation, Form 1 23/29 --- ---       Cognitive Screening:      Raw Score Percentile   SLUMS: 19/30 --- ---       RBANS, Form A: Standard Score/ Scaled Score Percentile   Total Score 61 <1 Exceptionally Low  Immediate Memory 76 5 Well Below Average    List Learning 7 16 Below Average    Story Memory 4 2 Well Below Average  Visuospatial/Constructional 69 2 Well Below Average    Figure Copy 3 1 Exceptionally Low    Line Orientation 14/20 17-25 Below Average to Average  Language 60 <1 Exceptionally Low    Picture Naming 8/10 3-9 Well Below Average    Semantic Fluency 2 <1 Exceptionally Low  Attention 49 <1 Exceptionally Low    Digit Span 4 2 Well Below Average    Coding 1 <1 Exceptionally Low  Delayed Memory 93 32 Average    List Recall 0/10 <2 Exceptionally Low    List Recognition 19/20 26-50 Average    Story Recall 4 2 Well Below Average    Story Recognition 7/12 5-7 Well Below Average     Figure Recall 10 50 Average    Figure Recognition 6/8 30-52 Average        Intellectual Functioning:      Standard Score Percentile   Test of Premorbid Functioning: 99 47 Average       Attention/Executive Function:     Trail Making Test (TMT): Raw Score (T Score) Percentile     Part A 50 secs.,  0 errors (39) 14 Below Average    Part B Discontinued --- Impaired         Scaled Score Percentile   WAIS-IV Digit Span: 3 1 Exceptionally Low    Forward 8 25 Average    Backward 1 <1 Exceptionally Low    Sequencing 6 9 Below Average        Scaled Score Percentile   WAIS-IV Similarities: 5 5 Well Below Average       D-KEFS Color-Word Interference Test: Raw Score (Scaled Score) Percentile     Color Naming 71 secs. (1) <1 Exceptionally Low  Word Reading 50 secs. (1) <1 Exceptionally Low    Inhibition Discontinued --- Impaired    Inhibition/Switching Not attempted --- ---       D-KEFS Verbal Fluency Test: Raw Score (Scaled Score) Percentile     Letter Total Correct 13 (3) 1 Exceptionally Low    Category Total Correct 22 (6) 9 Below Average    Category Switching Total Correct 5 (2) <1 Exceptionally Low    Category Switching Accuracy 3 (2) <1 Exceptionally Low      Total Set Loss Errors 3 (9) 37 Average      Total Repetition Errors 3 (10) 50 Average       NAB Executive Functions Module, Form 1: T Score Percentile     Judgment 57 75 Above Average       Language:      Raw Score Percentile   Sentence Repetition: 8/22 <1 Exceptionally Low       Verbal Fluency Test: Raw Score (T Score) Percentile     Phonemic Fluency (FAS) 13 (24) <1 Exceptionally Low    Animal Fluency 11 (31) 3 Well Below Average        NAB Language Module, Form 1: T Score Percentile     Auditory Comprehension 32 4 Well Below Average    Naming 29/31 (47) 38 Average       Visuospatial/Visuoconstruction:      Raw Score Percentile   Clock Drawing: 7/10 --- Within Normal Limits        Scaled Score Percentile    WAIS-IV Block Design: 3 1 Exceptionally Low       Mood and Personality:      Raw Score Percentile   Geriatric Depression Scale: 10 --- Mild  Geriatric Anxiety Scale: 6 --- Minimal    Somatic 1 --- Minimal    Cognitive 2 --- Minimal    Affective 3 --- Minimal       Additional Questionnaires:      Raw Score Percentile   PROMIS Sleep Disturbance Questionnaire: 10 --- None to Slight   Informed Consent and Coding/Compliance:   The current evaluation represents a clinical evaluation for the purposes previously outlined by the referral source and is in no way reflective of a forensic evaluation.   Ms. Hyder was provided with a verbal description of the nature and purpose of the present neuropsychological evaluation. Also reviewed were the foreseeable risks and/or discomforts and benefits of the procedure, limits of confidentiality, and mandatory reporting requirements of this provider. The patient was given the opportunity to ask questions and receive answers about the evaluation. Oral consent to participate was provided by the patient.   This evaluation was conducted by Christia Reading, Ph.D., ABPP-CN, board certified clinical neuropsychologist. Ms. Sadiq completed a clinical interview with Dr. Melvyn Novas, billed as one unit [phone], and 155 minutes of cognitive testing and scoring, billed as one unit [phone] and four additional units [id number]. Psychometrist Milana Kidney, B.S., assisted Dr. Melvyn Novas with test administration and scoring procedures. As a separate and discrete service, Dr. Melvyn Novas spent a total of 160 minutes in interpretation and report writing billed as one unit [phone] and two units [id number].

## 2022-07-06 NOTE — Progress Notes (Signed)
   Psychometrician Note   Cognitive testing was administered to [name] by Milana Kidney, B.S. (psychometrist) under the supervision of Dr. Christia Reading, Ph.D., licensed psychologist on 07/06/2022. [name] did not appear overtly distressed by the testing session per behavioral observation or responses across self-report questionnaires. Rest breaks were offered.    The battery of tests administered was selected by Dr. Christia Reading, Ph.D. with consideration to [name]'s current level of functioning, the nature of her symptoms, emotional and behavioral responses during interview, level of literacy, observed level of motivation/effort, and the nature of the referral question. This battery was communicated to the psychometrist. Communication between Dr. Christia Reading, Ph.D. and the psychometrist was ongoing throughout the evaluation and Dr. Christia Reading, Ph.D. was immediately accessible at all times. Dr. Christia Reading, Ph.D. provided supervision to the psychometrist on the date of this service to the extent necessary to assure the quality of all services provided.    [name] will return within approximately 1-2 weeks for an interactive feedback session with Dr. Melvyn Novas at which time her test performances, clinical impressions, and treatment recommendations will be reviewed in detail. [name] understands she can contact our office should she require our assistance before this time.  A total of 155 minutes of billable time were spent face-to-face with [name] by the psychometrist. This includes both test administration and scoring time. Billing for these services is reflected in the clinical report generated by Dr. Christia Reading, Ph.D.  This note reflects time spent with the psychometrician and does not include test scores or any clinical interpretations made by Dr. Melvyn Novas. The full report will follow in a separate note.

## 2022-07-09 DIAGNOSIS — I440 Atrioventricular block, first degree: Secondary | ICD-10-CM | POA: Diagnosis not present

## 2022-07-09 DIAGNOSIS — W19XXXA Unspecified fall, initial encounter: Secondary | ICD-10-CM | POA: Diagnosis not present

## 2022-07-09 DIAGNOSIS — R001 Bradycardia, unspecified: Secondary | ICD-10-CM | POA: Diagnosis not present

## 2022-07-09 DIAGNOSIS — Z743 Need for continuous supervision: Secondary | ICD-10-CM | POA: Diagnosis not present

## 2022-07-09 DIAGNOSIS — W01198A Fall on same level from slipping, tripping and stumbling with subsequent striking against other object, initial encounter: Secondary | ICD-10-CM | POA: Diagnosis not present

## 2022-07-09 DIAGNOSIS — R296 Repeated falls: Secondary | ICD-10-CM | POA: Diagnosis not present

## 2022-07-09 DIAGNOSIS — Z7901 Long term (current) use of anticoagulants: Secondary | ICD-10-CM | POA: Diagnosis not present

## 2022-07-09 DIAGNOSIS — R4181 Age-related cognitive decline: Secondary | ICD-10-CM | POA: Diagnosis not present

## 2022-07-09 DIAGNOSIS — S0990XA Unspecified injury of head, initial encounter: Secondary | ICD-10-CM | POA: Diagnosis not present

## 2022-07-09 DIAGNOSIS — Y998 Other external cause status: Secondary | ICD-10-CM | POA: Diagnosis not present

## 2022-07-09 DIAGNOSIS — Z043 Encounter for examination and observation following other accident: Secondary | ICD-10-CM | POA: Diagnosis not present

## 2022-07-13 ENCOUNTER — Ambulatory Visit: Payer: Medicare HMO | Admitting: Psychology

## 2022-07-13 DIAGNOSIS — F03A2 Unspecified dementia, mild, with psychotic disturbance: Secondary | ICD-10-CM

## 2022-07-13 DIAGNOSIS — R69 Illness, unspecified: Secondary | ICD-10-CM | POA: Diagnosis not present

## 2022-07-13 NOTE — Progress Notes (Signed)
   Neuropsychology Feedback Session Tillie Rung. [facility] Department of Neurology  Reason for Referral:   [name] is a 76 y.o. right-handed Caucasian female referred by Metta Clines, D.O., to characterize her current cognitive functioning and assist with diagnostic clarity and treatment planning in the context of prior concussion history and concern for progressive cognitive decline.   Feedback:   [name] completed a comprehensive neuropsychological evaluation on 07/06/2022. Please refer to that encounter for the full report and recommendations. Briefly, results suggested primary impairments surrounding processing speed, executive functioning, and phonemic fluency. Additional performance variability (but with scores trending towards impairments overall) was exhibited across attention/concentration, semantic fluency, confrontation naming, visuospatial abilities, and all aspects of learning and memory. The underlying etiology is quite challenging to decipher based upon current testing and overall unclear. I cannot rule out an underlying Lewy body dementia presentation. Her pattern across testing reasonably aligns with this illness (however, more consistent visuospatial impairment would be more typical). There has also been report of some REM sleep behaviors, gait/balance disturbances, fluctuations in alertness, and visual hallucinations, all of which are commonly seen in this illness. However, per [name], at least some of these behaviors/symptoms were attributed to medication side effects, particularly donepezil/Aricept. Also along the parkinsonian spectrum, I cannot rule out progressive supranuclear palsy (PSP) given testing patterns, gait changes, and frequent falling behaviors. However, her brain MRI did not reveal advanced midbrain atrophy and there is no mention of oculomotor gaze palsy in her medical records, making this less likely (and already far more rare when  compared to Lewy body disease). I do not see compelling behavioral evidence to suggest ongoing corticobasal generation or multisystem atrophy.   [name] was accompanied by her son in-person and several family members via [place]. Content of the current session focused on the results of her neuropsychological evaluation. [name] was given the opportunity to ask questions and her questions were answered. She was encouraged to reach out should additional questions arise. A copy of her report was provided at the conclusion of the visit.      35 minutes were spent conducting the current feedback session with [name], billed as one unit [phone].

## 2022-07-14 ENCOUNTER — Encounter: Payer: Self-pay | Admitting: Neurology

## 2022-07-20 NOTE — Telephone Encounter (Signed)
Left message and sent mychart message for patient to call our office back so we could get her schedule for a sooner appt with [facility]

## 2022-07-26 IMAGING — MG MM PLC BREAST LOC DEV 1ST LESION INC*R*
6 of 10 series · 6 of 30 positions shown · non-contrast
Comparison: Previous exam(s).

CLINICAL DATA: Localized biopsy clip

EXAM:
MAMMOGRAPHIC GUIDED RADIOACTIVE SEED LOCALIZATION OF THE RIGHT
BREAST

[R LM synth-2D (1 of 4)]
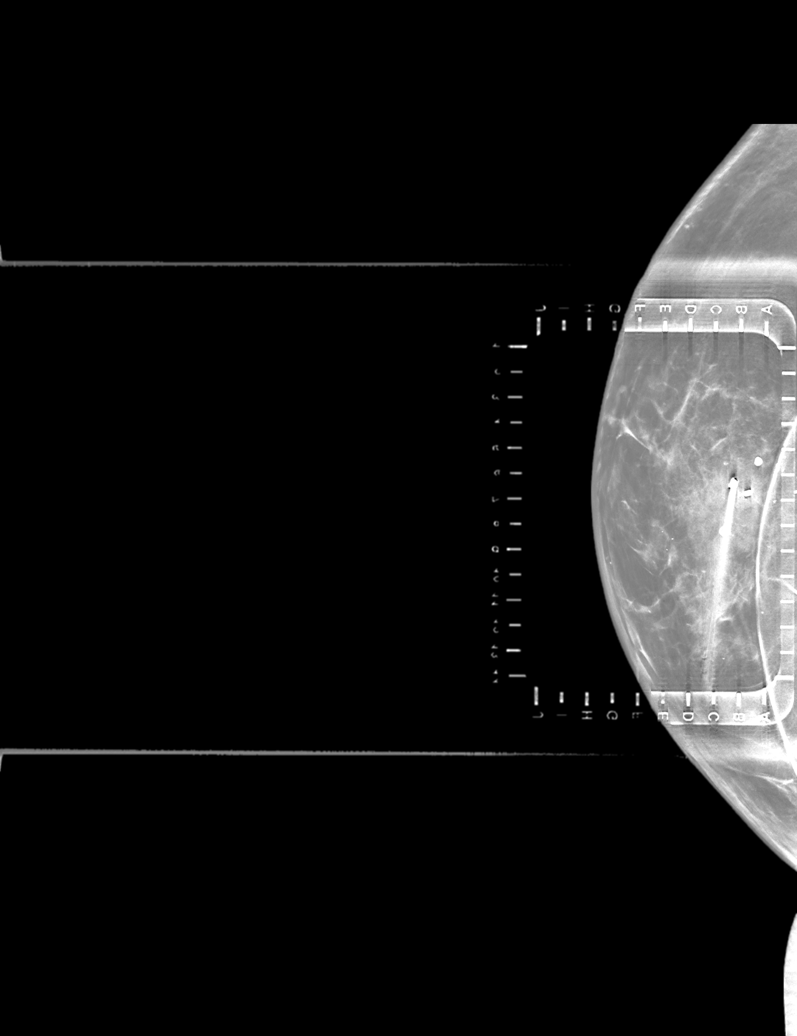

[R LM synth-2D (2 of 4)]
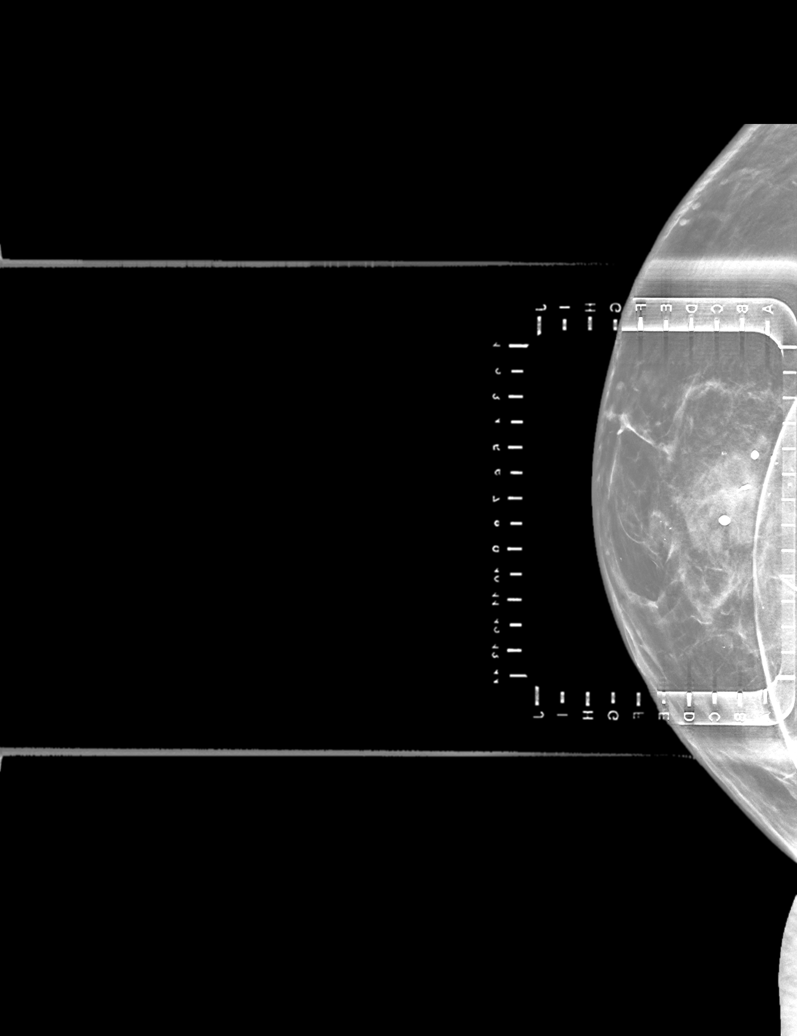

[R LM synth-2D (3 of 4)]
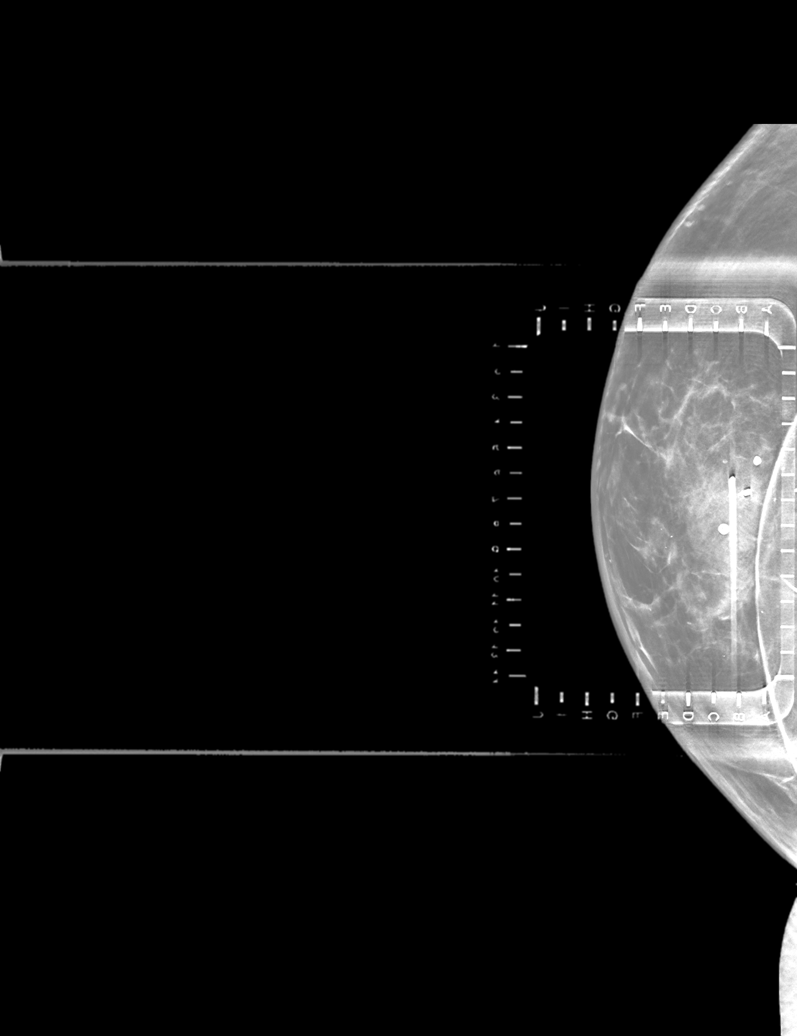

[R LM synth-2D (4 of 4)]
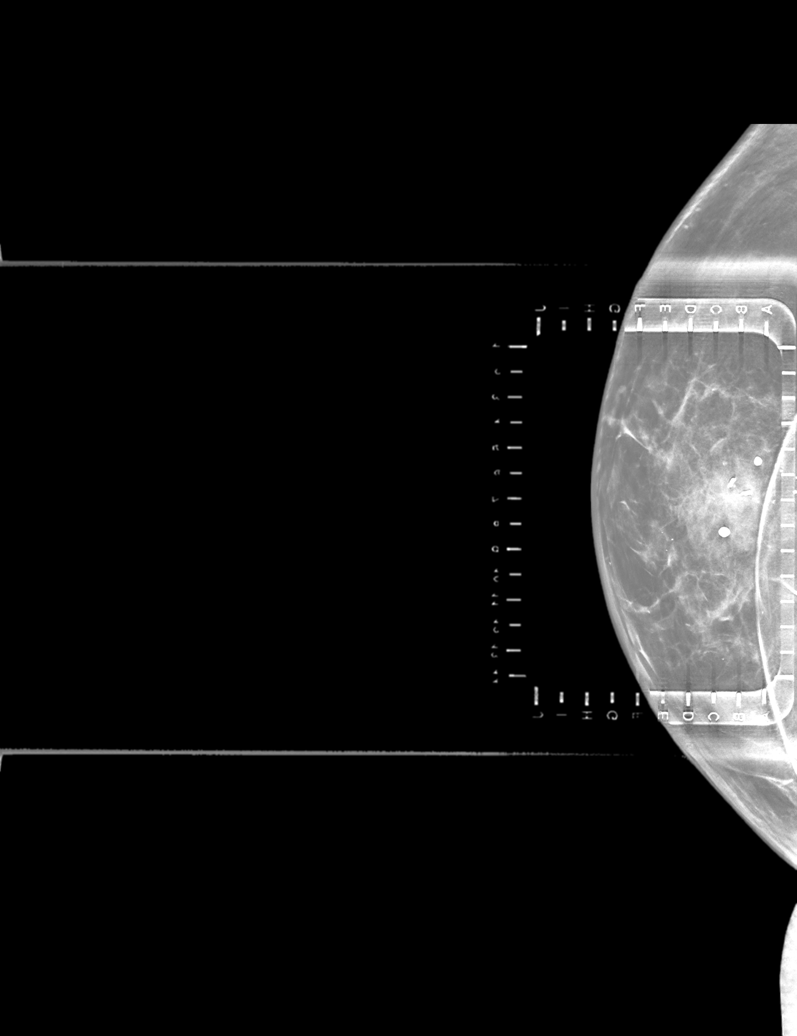

[R CC synth-2D]
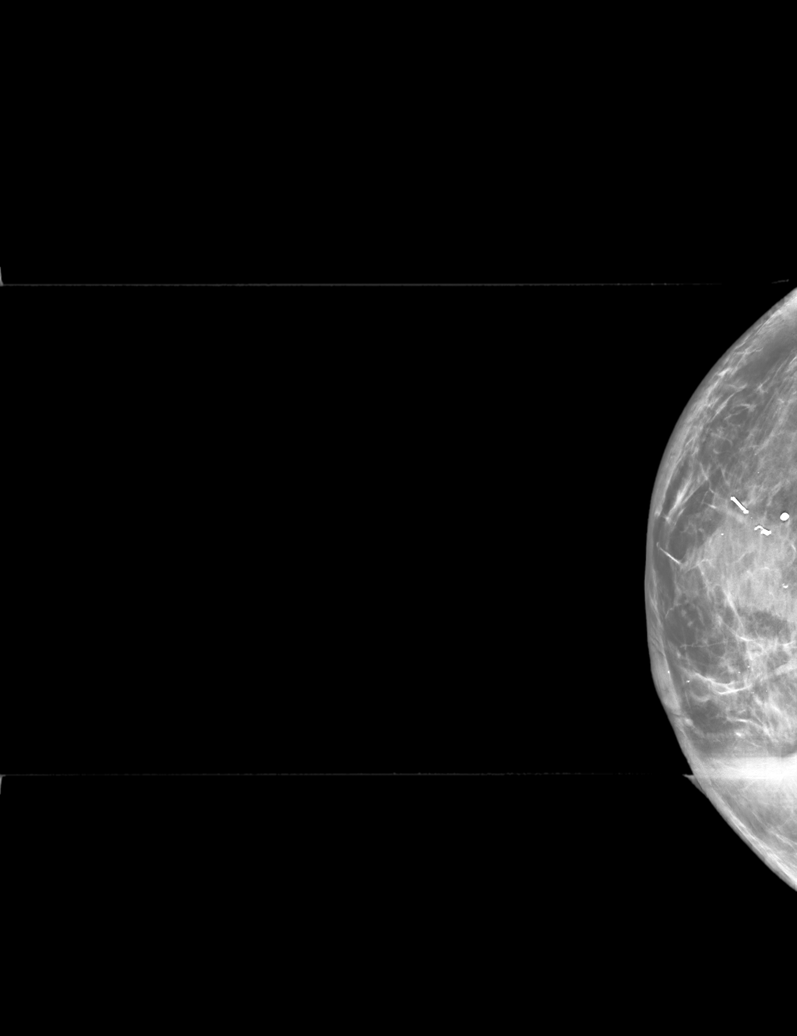

[R LM tomo · tomo slice 27/54.0]
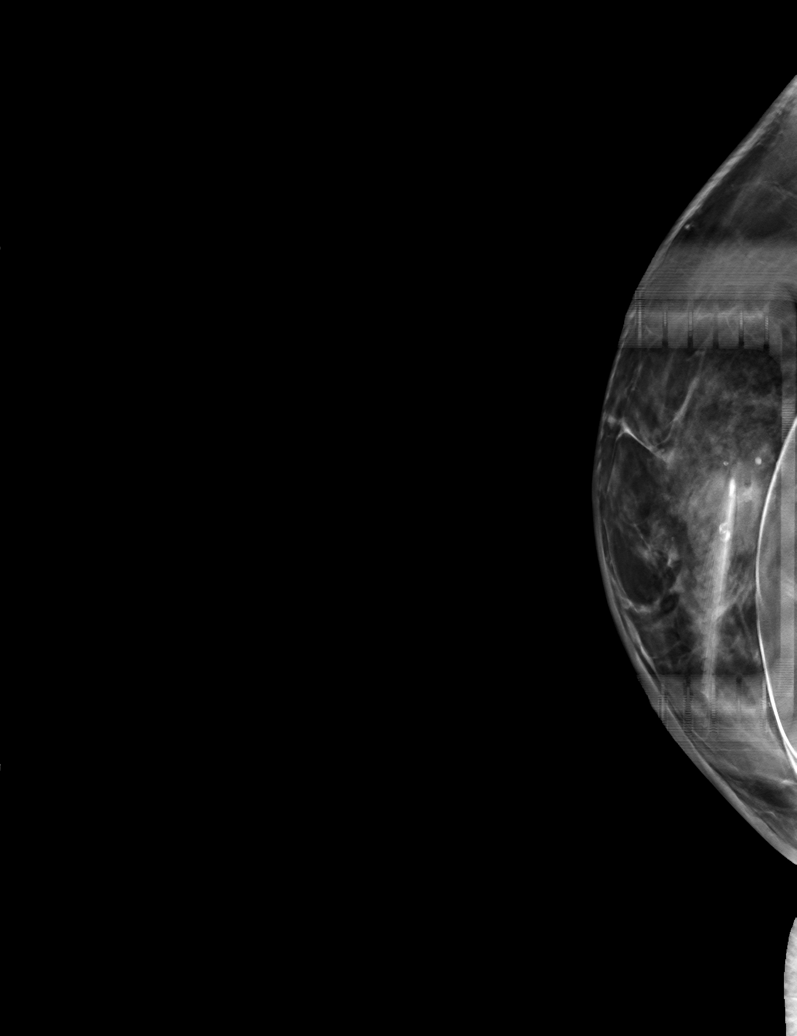

[6 of 30 positions shown; findings below may reference images not displayed]



The usual time-out protocol was performed immediately prior to the
procedure.

Using mammographic guidance, sterile technique, 1% lidocaine and an
4-2TD radioactive seed, the biopsy clip was localized using a
lateral approach. The follow-up mammogram images confirm the seed in
the expected location and were marked for the surgeon.

Follow-up survey of the patient confirms presence of the radioactive
seed.

Order number of 4-2TD seed:  [id number].

Total activity:  0.258 millicuries reference Date: November 18, 2020

The patient tolerated the procedure well and was released from the
[REDACTED]. She was given instructions regarding seed removal.
IMPRESSION: Radioactive seed localization right breast. No apparent
complications.

## 2022-07-27 ENCOUNTER — Encounter: Payer: Self-pay | Admitting: Neurology

## 2022-07-28 DIAGNOSIS — L603 Nail dystrophy: Secondary | ICD-10-CM | POA: Diagnosis not present

## 2022-07-28 DIAGNOSIS — B351 Tinea unguium: Secondary | ICD-10-CM | POA: Diagnosis not present

## 2022-07-28 DIAGNOSIS — L84 Corns and callosities: Secondary | ICD-10-CM | POA: Diagnosis not present

## 2022-07-28 DIAGNOSIS — L600 Ingrowing nail: Secondary | ICD-10-CM | POA: Diagnosis not present

## 2022-07-28 DIAGNOSIS — I739 Peripheral vascular disease, unspecified: Secondary | ICD-10-CM | POA: Diagnosis not present

## 2022-07-29 DIAGNOSIS — Z853 Personal history of malignant neoplasm of breast: Secondary | ICD-10-CM | POA: Diagnosis not present

## 2022-07-29 DIAGNOSIS — E782 Mixed hyperlipidemia: Secondary | ICD-10-CM | POA: Diagnosis not present

## 2022-07-29 DIAGNOSIS — I25119 Atherosclerotic heart disease of native coronary artery with unspecified angina pectoris: Secondary | ICD-10-CM | POA: Diagnosis not present

## 2022-07-29 DIAGNOSIS — Z96651 Presence of right artificial knee joint: Secondary | ICD-10-CM | POA: Diagnosis not present

## 2022-07-29 DIAGNOSIS — I10 Essential (primary) hypertension: Secondary | ICD-10-CM | POA: Diagnosis not present

## 2022-07-29 DIAGNOSIS — G3183 Dementia with Lewy bodies: Secondary | ICD-10-CM | POA: Diagnosis not present

## 2022-07-29 DIAGNOSIS — M5001 Cervical disc disorder with myelopathy,  high cervical region: Secondary | ICD-10-CM | POA: Diagnosis not present

## 2022-07-29 DIAGNOSIS — Z9011 Acquired absence of right breast and nipple: Secondary | ICD-10-CM | POA: Diagnosis not present

## 2022-07-29 DIAGNOSIS — R2689 Other abnormalities of gait and mobility: Secondary | ICD-10-CM | POA: Diagnosis not present

## 2022-07-29 DIAGNOSIS — R296 Repeated falls: Secondary | ICD-10-CM | POA: Diagnosis not present

## 2022-07-30 NOTE — Progress Notes (Signed)
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 Cervical myelopathy s/p fusion    1  PET scan to differentiate hypometabolic pattern between Lewy Body dementia and Alzheimer's 2  Supportive care.  Defer starting a different cholinesterase inhibitor for now. 3  Follow up 6 months.   Subjective:  [name] is a 76 year old right-handed female with  Recently diagnosed noninvasive breast cancer who follows up for postconcussion syndrome and myelopathy.  She is accompanied by her sonwho supplements history.     UPDATE: Currently taking donepezil 58m at bedtime  Stopped donepezil as it contributed to hallucinations (thought her figurines were dancing and the floor tiles were smiling with her).  Resolved after discontinuation.  Has an aid.     Underwent neuropsychological evaluation with Dr. MMelvyn Novason 07/06/2022.  She was found to have mild-moderate state major neurocognitive disorder.  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  HISTORY: On 06/30/2020, she tripped and fell in the bathroom at church, hitting her head on the bathroom stall sustaining a scalp laceration.  She was seen in Urgent Care where she and her spouse reported that she did not lose consciousness and denied headache, blurred vision, nausea, vomiting, dizziness or lightheadedness.  When she returned to Urgent Care on 07/10/2020 for suture removal, she still denied headache. However, she has reported lightheadedness and feeling dizzy particularly in changing positions.  She is therefore scared to drive.  She reports problems with memory, having to write things down.  She reports word-finding issues and speech is slurred.  She also has trouble writing.   Handwriting is smaller.  It takes prolonged period of time to write things down.  She is still able to pay the bills but takes a while to complete task.  She also cries easily.  She has been using a cane as she has been unsteady. There has been no improvement in symptoms but they have not gotten worse either.  MRI of brain with and without contrast on 09/06/2020 showed mild generalized cerebral atrophy and chronic small vessel ischemic changes but no acute intracranial abnormality.  Noted to have UMN signs on exam, such as hyperreflexia, Hoffman and Babinski signs.  MRI of cervical spine with and without contrast on 12/27/2020 personally reviewed and showed cord compression at C4-5 and C5-6.  She was referred to neurosurgery and underwent ACDF at those levels in May 2022.  She is in rehab.  She is doing much better.  They will be working on balance next.  Speech has cleared up.  Cognition improved.  She may need a moment to gather her thoughts of what she wants to say.  Emotional lability has improved.       She hit her head after tripping on the driveway in [id number] no concussion or LOC with that event  PAST MEDICAL HISTORY: Past Medical History:  Diagnosis Date   Allergic rhinitis    Cervical myelopathy 04/23/2021   Cervical spondylosis    bulging discs   Coronary artery disease    s/p CAGB 01/2011- LIMA to LAD patent, all other vein grafts occluded. Circumflex DES 3/13 placed following lateral ischemia on nuclear stress test   Coronary atherosclerosis of native coronary artery 02/05/2012   CAD - CABG  2/12 - LIMA-LAD, S-D, S-OM, S-PDA [place] [employer]) NUC - 2/13 - lateral ischemia CATH- 02/05/12 - all 3 SVG grafts were down. LIMA patent. Successful PCI of the left circumflex with overlapping Promus drug-eluting stents, 2.5 x 38 and 2.75 x 16. The stented area was postdilated to greater than 3 mm in the mid and proximal stented portion. 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  The report date is November 30, 2020.   The Common Hereditary Cancers Panel offered by [facility] includes sequencing and/or deletion duplication testing of the following 48 genes: APC, ATM, AXIN2, BARD1, BMPR1A, BRCA1, BRCA2, BRIP1, CDH1, CDK4, CDKN2A (p14ARF), CDKN2A (p16INK4a), CHEK2, CTNNA1, DIC   Hypertensive retinopathy    Mild dementia 07/06/2022   Mixed hyperlipidemia    Mixed incontinence    Obesity    Paroxysmal atrial fibrillation 05/15/2021   Post concussion syndrome    Postmenopausal    Prediabetes    Sciatica    Secondary hypercoagulable state 05/15/2021    MEDICATIONS: Current Outpatient Medications on File Prior to Visit  Medication Sig Dispense Refill   acetaminophen (TYLENOL) 500 MG tablet Take 500-1,000 mg by mouth every 6 (six) hours as needed (pain).     apixaban (ELIQUIS) 5 MG TABS tablet Take 1 tablet (5 mg total) by mouth 2 (two) times daily. 60 tablet 11   atenolol (TENORMIN) 50 MG tablet Take 1 tablet (50 mg total) by mouth daily. 90 tablet 2   B Complex-C-E-Zn (EQL STRESS B-COMPLEX C/ZINC) TABS take 1 tablet by oral route every day     cephALEXin (KEFLEX) 500 MG capsule Take 500 mg by mouth 4 (four) times daily. 3x/day for 7 days (Patient not taking: Reported on 04/22/2022)     COVID-19 mRNA Vac-TriS, Pfizer, SUSP injection Inject into the muscle. 0.3 mL 0   donepezil (ARICEPT) 10 MG tablet Take 1 tablet (10 mg total) by mouth at bedtime. 30 tablet 5   furosemide (LASIX) 20 MG tablet TAKE 1 TABLET BY MOUTH EVERY DAY 90 tablet 3   ibuprofen (ADVIL) 200 MG tablet 1 tablet with food or milk as needed     ketoconazole (NIZORAL) 2 % cream  Apply 1 application topically daily. 15 g 4   Multiple Vitamin (MULTIVITAMIN WITH MINERALS) TABS tablet Take 1 tablet by mouth daily.     Potassium Gluconate 550 MG TABS take 1 tablet by oral route every day     rosuvastatin (CRESTOR) 20 MG tablet TAKE 1 TABLET BY MOUTH EVERY DAY 90 tablet 3   No current facility-administered medications on file prior to visit.    ALLERGIES: Allergies  Allergen Reactions   Norvasc [Amlodipine Besylate] Other (See Comments)    Edema    Latex Itching and Rash   Lipitor [Atorvastatin] Other (See Comments)    Muscle aches   Lisinopril Swelling and Cough   Penicillins Rash    Tolerated Ancef 2g IV on 02/19/21   Sulfa Antibiotics Rash   Tape Itching and Rash   Tetanus Toxoids Other (See Comments)    Patient said "Didn't do well".    FAMILY HISTORY: Family History  Problem Relation Age of Onset   Heart  disease Mother    Hypertension Mother    Heart attack Father    Alcoholism Father    Heart disease Brother    Heart disease Brother    Breast cancer Paternal Grandmother        dx unknown age   ADD / ADHD Son    ADD / ADHD Son    Breast cancer Paternal Aunt        dx unknown age      Objective:  Blood pressure 109/65, pulse 64, height 5' 6"  (1.676 m), weight 144 lb (65.3 kg), SpO2 98 %. General: No acute distress.  Patient appears well-groomed.   Head:  Normocephalic/atraumatic   Metta Clines, DO  CC: Orpah Melter, MD

## 2022-07-31 ENCOUNTER — Encounter: Payer: Self-pay | Admitting: Neurology

## 2022-07-31 ENCOUNTER — Ambulatory Visit (INDEPENDENT_AMBULATORY_CARE_PROVIDER_SITE_OTHER): Payer: Medicare HMO | Admitting: Neurology

## 2022-07-31 VITALS — BP 109/65 | HR 64 | Ht 66.0 in | Wt 144.0 lb

## 2022-07-31 DIAGNOSIS — R69 Illness, unspecified: Secondary | ICD-10-CM | POA: Diagnosis not present

## 2022-07-31 DIAGNOSIS — Z853 Personal history of malignant neoplasm of breast: Secondary | ICD-10-CM | POA: Diagnosis not present

## 2022-07-31 DIAGNOSIS — G3184 Mild cognitive impairment, so stated: Secondary | ICD-10-CM

## 2022-07-31 DIAGNOSIS — Z96659 Presence of unspecified artificial knee joint: Secondary | ICD-10-CM | POA: Diagnosis not present

## 2022-07-31 DIAGNOSIS — Z9181 History of falling: Secondary | ICD-10-CM | POA: Diagnosis not present

## 2022-07-31 DIAGNOSIS — I10 Essential (primary) hypertension: Secondary | ICD-10-CM | POA: Diagnosis not present

## 2022-07-31 DIAGNOSIS — Z981 Arthrodesis status: Secondary | ICD-10-CM | POA: Diagnosis not present

## 2022-07-31 DIAGNOSIS — G3183 Dementia with Lewy bodies: Secondary | ICD-10-CM | POA: Diagnosis not present

## 2022-07-31 DIAGNOSIS — Z7901 Long term (current) use of anticoagulants: Secondary | ICD-10-CM | POA: Diagnosis not present

## 2022-07-31 DIAGNOSIS — E119 Type 2 diabetes mellitus without complications: Secondary | ICD-10-CM | POA: Diagnosis not present

## 2022-07-31 NOTE — Patient Instructions (Signed)
Will check PET scan of brain.  Further recommendations pending results Continue supportive care Follow up in 6 months.

## 2022-08-11 DIAGNOSIS — M5416 Radiculopathy, lumbar region: Secondary | ICD-10-CM | POA: Diagnosis not present

## 2022-08-12 DIAGNOSIS — G3183 Dementia with Lewy bodies: Secondary | ICD-10-CM | POA: Diagnosis not present

## 2022-08-12 DIAGNOSIS — I10 Essential (primary) hypertension: Secondary | ICD-10-CM | POA: Diagnosis not present

## 2022-08-14 DIAGNOSIS — Z0279 Encounter for issue of other medical certificate: Secondary | ICD-10-CM

## 2022-08-14 NOTE — Progress Notes (Signed)
PETScan Approved 08/05/22-02/06/23, Auth# [id number]

## 2022-08-26 DIAGNOSIS — J158 Pneumonia due to other specified bacteria: Secondary | ICD-10-CM | POA: Diagnosis not present

## 2022-08-26 DIAGNOSIS — G3183 Dementia with Lewy bodies: Secondary | ICD-10-CM | POA: Diagnosis not present

## 2022-08-26 DIAGNOSIS — R4182 Altered mental status, unspecified: Secondary | ICD-10-CM | POA: Diagnosis not present

## 2022-08-27 DIAGNOSIS — N390 Urinary tract infection, site not specified: Secondary | ICD-10-CM | POA: Diagnosis not present

## 2022-09-02 DIAGNOSIS — G3183 Dementia with Lewy bodies: Secondary | ICD-10-CM | POA: Diagnosis not present

## 2022-09-02 DIAGNOSIS — Z993 Dependence on wheelchair: Secondary | ICD-10-CM | POA: Diagnosis not present

## 2022-09-02 DIAGNOSIS — I10 Essential (primary) hypertension: Secondary | ICD-10-CM | POA: Diagnosis not present

## 2022-09-02 DIAGNOSIS — L22 Diaper dermatitis: Secondary | ICD-10-CM | POA: Diagnosis not present

## 2022-09-02 DIAGNOSIS — Z9011 Acquired absence of right breast and nipple: Secondary | ICD-10-CM | POA: Diagnosis not present

## 2022-09-02 DIAGNOSIS — J158 Pneumonia due to other specified bacteria: Secondary | ICD-10-CM | POA: Diagnosis not present

## 2022-09-03 ENCOUNTER — Ambulatory Visit (HOSPITAL_COMMUNITY)
Admission: RE | Admit: 2022-09-03 | Discharge: 2022-09-03 | Disposition: A | Discharge status: Home / Self Care | Payer: Medicare HMO | Source: Ambulatory Visit | Attending: Neurology | Admitting: Neurology

## 2022-09-03 DIAGNOSIS — G3184 Mild cognitive impairment, so stated: Secondary | ICD-10-CM | POA: Diagnosis not present

## 2022-09-03 DIAGNOSIS — F03A2 Unspecified dementia, mild, with psychotic disturbance: Secondary | ICD-10-CM | POA: Insufficient documentation

## 2022-09-03 DIAGNOSIS — R2689 Other abnormalities of gait and mobility: Secondary | ICD-10-CM | POA: Diagnosis not present

## 2022-09-03 DIAGNOSIS — R69 Illness, unspecified: Secondary | ICD-10-CM | POA: Diagnosis not present

## 2022-09-03 LAB — GLUCOSE, CAPILLARY: Glucose-Capillary: 110 mg/dL — ABNORMAL HIGH (ref 70–99)

## 2022-09-03 MED ORDER — FLUDEOXYGLUCOSE F - 18 (FDG) INJECTION
10.0000 | Freq: Once | INTRAVENOUS | Status: AC
  Administered 2022-09-03: 9.93 via INTRAVENOUS

## 2022-09-09 NOTE — Progress Notes (Signed)
Advised Spouse of results. He advised to call the [facility] to advised him of the results as well since he the one taking her and keep up with the her care.  Tried calling the [facility], no answer LMOVM to call the office back.

## 2022-09-09 NOTE — Progress Notes (Signed)
Patient son advised of results. Patient sone wants to research other medication before stating something. He will reach out via mychart with any questions he may have later.

## 2022-09-13 DIAGNOSIS — S0990XA Unspecified injury of head, initial encounter: Secondary | ICD-10-CM | POA: Diagnosis not present

## 2022-09-13 DIAGNOSIS — R531 Weakness: Secondary | ICD-10-CM | POA: Diagnosis not present

## 2022-09-13 DIAGNOSIS — Z882 Allergy status to sulfonamides status: Secondary | ICD-10-CM | POA: Diagnosis not present

## 2022-09-13 DIAGNOSIS — Z888 Allergy status to other drugs, medicaments and biological substances status: Secondary | ICD-10-CM | POA: Diagnosis not present

## 2022-09-13 DIAGNOSIS — Z91040 Latex allergy status: Secondary | ICD-10-CM | POA: Diagnosis not present

## 2022-09-13 DIAGNOSIS — W1839XA Other fall on same level, initial encounter: Secondary | ICD-10-CM | POA: Diagnosis not present

## 2022-09-13 DIAGNOSIS — I10 Essential (primary) hypertension: Secondary | ICD-10-CM | POA: Diagnosis not present

## 2022-09-13 DIAGNOSIS — R918 Other nonspecific abnormal finding of lung field: Secondary | ICD-10-CM | POA: Diagnosis not present

## 2022-09-13 DIAGNOSIS — Z743 Need for continuous supervision: Secondary | ICD-10-CM | POA: Diagnosis not present

## 2022-09-13 DIAGNOSIS — W19XXXA Unspecified fall, initial encounter: Secondary | ICD-10-CM | POA: Diagnosis not present

## 2022-09-13 DIAGNOSIS — Z880 Allergy status to penicillin: Secondary | ICD-10-CM | POA: Diagnosis not present

## 2022-09-13 DIAGNOSIS — Z7982 Long term (current) use of aspirin: Secondary | ICD-10-CM | POA: Diagnosis not present

## 2022-09-13 DIAGNOSIS — R69 Illness, unspecified: Secondary | ICD-10-CM | POA: Diagnosis not present

## 2022-09-13 DIAGNOSIS — Z79899 Other long term (current) drug therapy: Secondary | ICD-10-CM | POA: Diagnosis not present

## 2022-09-13 DIAGNOSIS — R519 Headache, unspecified: Secondary | ICD-10-CM | POA: Diagnosis not present

## 2022-09-13 DIAGNOSIS — I4430 Unspecified atrioventricular block: Secondary | ICD-10-CM | POA: Diagnosis not present

## 2022-09-16 DIAGNOSIS — R2689 Other abnormalities of gait and mobility: Secondary | ICD-10-CM | POA: Diagnosis not present

## 2022-09-16 DIAGNOSIS — I10 Essential (primary) hypertension: Secondary | ICD-10-CM | POA: Diagnosis not present

## 2022-09-16 DIAGNOSIS — G3183 Dementia with Lewy bodies: Secondary | ICD-10-CM | POA: Diagnosis not present

## 2022-09-16 DIAGNOSIS — R296 Repeated falls: Secondary | ICD-10-CM | POA: Diagnosis not present

## 2022-09-29 DIAGNOSIS — G3183 Dementia with Lewy bodies: Secondary | ICD-10-CM | POA: Diagnosis not present

## 2022-09-29 DIAGNOSIS — I10 Essential (primary) hypertension: Secondary | ICD-10-CM | POA: Diagnosis not present

## 2022-10-04 DIAGNOSIS — E876 Hypokalemia: Secondary | ICD-10-CM | POA: Diagnosis not present

## 2022-10-04 DIAGNOSIS — Z882 Allergy status to sulfonamides status: Secondary | ICD-10-CM | POA: Diagnosis not present

## 2022-10-04 DIAGNOSIS — E782 Mixed hyperlipidemia: Secondary | ICD-10-CM | POA: Diagnosis not present

## 2022-10-04 DIAGNOSIS — D0511 Intraductal carcinoma in situ of right breast: Secondary | ICD-10-CM | POA: Diagnosis not present

## 2022-10-04 DIAGNOSIS — I480 Paroxysmal atrial fibrillation: Secondary | ICD-10-CM | POA: Diagnosis not present

## 2022-10-04 DIAGNOSIS — R918 Other nonspecific abnormal finding of lung field: Secondary | ICD-10-CM | POA: Diagnosis not present

## 2022-10-04 DIAGNOSIS — I2510 Atherosclerotic heart disease of native coronary artery without angina pectoris: Secondary | ICD-10-CM | POA: Diagnosis not present

## 2022-10-04 DIAGNOSIS — Z79899 Other long term (current) drug therapy: Secondary | ICD-10-CM | POA: Diagnosis not present

## 2022-10-04 DIAGNOSIS — R911 Solitary pulmonary nodule: Secondary | ICD-10-CM | POA: Diagnosis not present

## 2022-10-04 DIAGNOSIS — D6869 Other thrombophilia: Secondary | ICD-10-CM | POA: Diagnosis not present

## 2022-10-04 DIAGNOSIS — R296 Repeated falls: Secondary | ICD-10-CM | POA: Diagnosis not present

## 2022-10-04 DIAGNOSIS — D62 Acute posthemorrhagic anemia: Secondary | ICD-10-CM | POA: Diagnosis not present

## 2022-10-04 DIAGNOSIS — E559 Vitamin D deficiency, unspecified: Secondary | ICD-10-CM | POA: Diagnosis not present

## 2022-10-04 DIAGNOSIS — S72142A Displaced intertrochanteric fracture of left femur, initial encounter for closed fracture: Secondary | ICD-10-CM | POA: Diagnosis not present

## 2022-10-04 DIAGNOSIS — S8992XA Unspecified injury of left lower leg, initial encounter: Secondary | ICD-10-CM | POA: Diagnosis not present

## 2022-10-04 DIAGNOSIS — Z91040 Latex allergy status: Secondary | ICD-10-CM | POA: Diagnosis not present

## 2022-10-04 DIAGNOSIS — R69 Illness, unspecified: Secondary | ICD-10-CM | POA: Diagnosis not present

## 2022-10-04 DIAGNOSIS — Z880 Allergy status to penicillin: Secondary | ICD-10-CM | POA: Diagnosis not present

## 2022-10-04 DIAGNOSIS — Z9011 Acquired absence of right breast and nipple: Secondary | ICD-10-CM | POA: Diagnosis not present

## 2022-10-04 DIAGNOSIS — W19XXXA Unspecified fall, initial encounter: Secondary | ICD-10-CM | POA: Diagnosis not present

## 2022-10-04 DIAGNOSIS — Z951 Presence of aortocoronary bypass graft: Secondary | ICD-10-CM | POA: Diagnosis not present

## 2022-10-04 DIAGNOSIS — L89150 Pressure ulcer of sacral region, unstageable: Secondary | ICD-10-CM | POA: Diagnosis not present

## 2022-10-04 DIAGNOSIS — Z66 Do not resuscitate: Secondary | ICD-10-CM | POA: Diagnosis not present

## 2022-10-04 DIAGNOSIS — D72829 Elevated white blood cell count, unspecified: Secondary | ICD-10-CM | POA: Diagnosis not present

## 2022-10-04 DIAGNOSIS — Z853 Personal history of malignant neoplasm of breast: Secondary | ICD-10-CM | POA: Diagnosis not present

## 2022-10-04 DIAGNOSIS — R7989 Other specified abnormal findings of blood chemistry: Secondary | ICD-10-CM | POA: Diagnosis not present

## 2022-10-04 DIAGNOSIS — M79606 Pain in leg, unspecified: Secondary | ICD-10-CM | POA: Diagnosis not present

## 2022-10-04 DIAGNOSIS — S72002A Fracture of unspecified part of neck of left femur, initial encounter for closed fracture: Secondary | ICD-10-CM | POA: Diagnosis not present

## 2022-10-04 DIAGNOSIS — S72355A Nondisplaced comminuted fracture of shaft of left femur, initial encounter for closed fracture: Secondary | ICD-10-CM | POA: Diagnosis not present

## 2022-10-04 DIAGNOSIS — M5416 Radiculopathy, lumbar region: Secondary | ICD-10-CM | POA: Diagnosis not present

## 2022-10-04 DIAGNOSIS — Z743 Need for continuous supervision: Secondary | ICD-10-CM | POA: Diagnosis not present

## 2022-10-04 DIAGNOSIS — I10 Essential (primary) hypertension: Secondary | ICD-10-CM | POA: Diagnosis not present

## 2022-10-04 DIAGNOSIS — Z9889 Other specified postprocedural states: Secondary | ICD-10-CM | POA: Diagnosis not present

## 2022-10-04 DIAGNOSIS — Z888 Allergy status to other drugs, medicaments and biological substances status: Secondary | ICD-10-CM | POA: Diagnosis not present

## 2022-10-09 DIAGNOSIS — S72002D Fracture of unspecified part of neck of left femur, subsequent encounter for closed fracture with routine healing: Secondary | ICD-10-CM | POA: Diagnosis not present

## 2022-10-09 DIAGNOSIS — R059 Cough, unspecified: Secondary | ICD-10-CM | POA: Diagnosis not present

## 2022-10-09 DIAGNOSIS — I480 Paroxysmal atrial fibrillation: Secondary | ICD-10-CM | POA: Diagnosis not present

## 2022-10-09 DIAGNOSIS — M6281 Muscle weakness (generalized): Secondary | ICD-10-CM | POA: Diagnosis not present

## 2022-10-09 DIAGNOSIS — R1312 Dysphagia, oropharyngeal phase: Secondary | ICD-10-CM | POA: Diagnosis not present

## 2022-10-09 DIAGNOSIS — L89150 Pressure ulcer of sacral region, unstageable: Secondary | ICD-10-CM | POA: Diagnosis not present

## 2022-10-09 DIAGNOSIS — Z7901 Long term (current) use of anticoagulants: Secondary | ICD-10-CM | POA: Diagnosis not present

## 2022-10-09 DIAGNOSIS — R630 Anorexia: Secondary | ICD-10-CM | POA: Diagnosis not present

## 2022-10-09 DIAGNOSIS — S72009D Fracture of unspecified part of neck of unspecified femur, subsequent encounter for closed fracture with routine healing: Secondary | ICD-10-CM | POA: Diagnosis not present

## 2022-10-09 DIAGNOSIS — G3183 Dementia with Lewy bodies: Secondary | ICD-10-CM | POA: Diagnosis not present

## 2022-10-09 DIAGNOSIS — I2510 Atherosclerotic heart disease of native coronary artery without angina pectoris: Secondary | ICD-10-CM | POA: Diagnosis not present

## 2022-10-09 DIAGNOSIS — D62 Acute posthemorrhagic anemia: Secondary | ICD-10-CM | POA: Diagnosis not present

## 2022-10-09 DIAGNOSIS — Z743 Need for continuous supervision: Secondary | ICD-10-CM | POA: Diagnosis not present

## 2022-10-09 DIAGNOSIS — E782 Mixed hyperlipidemia: Secondary | ICD-10-CM | POA: Diagnosis not present

## 2022-10-09 DIAGNOSIS — D0591 Unspecified type of carcinoma in situ of right breast: Secondary | ICD-10-CM | POA: Diagnosis not present

## 2022-10-09 DIAGNOSIS — Z6824 Body mass index (BMI) 24.0-24.9, adult: Secondary | ICD-10-CM | POA: Diagnosis not present

## 2022-10-09 DIAGNOSIS — S79929A Unspecified injury of unspecified thigh, initial encounter: Secondary | ICD-10-CM | POA: Diagnosis not present

## 2022-10-09 DIAGNOSIS — R799 Abnormal finding of blood chemistry, unspecified: Secondary | ICD-10-CM | POA: Diagnosis not present

## 2022-10-09 DIAGNOSIS — S72142D Displaced intertrochanteric fracture of left femur, subsequent encounter for closed fracture with routine healing: Secondary | ICD-10-CM | POA: Diagnosis not present

## 2022-10-09 DIAGNOSIS — R41841 Cognitive communication deficit: Secondary | ICD-10-CM | POA: Diagnosis not present

## 2022-10-09 DIAGNOSIS — Z9181 History of falling: Secondary | ICD-10-CM | POA: Diagnosis not present

## 2022-10-09 DIAGNOSIS — I10 Essential (primary) hypertension: Secondary | ICD-10-CM | POA: Diagnosis not present

## 2022-10-09 DIAGNOSIS — E559 Vitamin D deficiency, unspecified: Secondary | ICD-10-CM | POA: Diagnosis not present

## 2022-10-09 DIAGNOSIS — R278 Other lack of coordination: Secondary | ICD-10-CM | POA: Diagnosis not present

## 2022-10-09 DIAGNOSIS — R69 Illness, unspecified: Secondary | ICD-10-CM | POA: Diagnosis not present

## 2022-10-12 DIAGNOSIS — I480 Paroxysmal atrial fibrillation: Secondary | ICD-10-CM | POA: Diagnosis not present

## 2022-10-12 DIAGNOSIS — G3183 Dementia with Lewy bodies: Secondary | ICD-10-CM | POA: Diagnosis not present

## 2022-10-12 DIAGNOSIS — Z9181 History of falling: Secondary | ICD-10-CM | POA: Diagnosis not present

## 2022-10-12 DIAGNOSIS — R69 Illness, unspecified: Secondary | ICD-10-CM | POA: Diagnosis not present

## 2022-10-12 DIAGNOSIS — Z7901 Long term (current) use of anticoagulants: Secondary | ICD-10-CM | POA: Diagnosis not present

## 2022-10-12 DIAGNOSIS — R799 Abnormal finding of blood chemistry, unspecified: Secondary | ICD-10-CM | POA: Diagnosis not present

## 2022-10-12 DIAGNOSIS — S72002D Fracture of unspecified part of neck of left femur, subsequent encounter for closed fracture with routine healing: Secondary | ICD-10-CM | POA: Diagnosis not present

## 2022-10-12 DIAGNOSIS — I2510 Atherosclerotic heart disease of native coronary artery without angina pectoris: Secondary | ICD-10-CM | POA: Diagnosis not present

## 2022-10-20 DIAGNOSIS — L89150 Pressure ulcer of sacral region, unstageable: Secondary | ICD-10-CM | POA: Diagnosis not present

## 2022-10-26 ENCOUNTER — Ambulatory Visit: Payer: Medicare HMO | Admitting: Neurology

## 2022-10-26 DIAGNOSIS — Z6824 Body mass index (BMI) 24.0-24.9, adult: Secondary | ICD-10-CM | POA: Diagnosis not present

## 2022-10-26 DIAGNOSIS — R69 Illness, unspecified: Secondary | ICD-10-CM | POA: Diagnosis not present

## 2022-10-26 DIAGNOSIS — S72009D Fracture of unspecified part of neck of unspecified femur, subsequent encounter for closed fracture with routine healing: Secondary | ICD-10-CM | POA: Diagnosis not present

## 2022-10-26 DIAGNOSIS — I480 Paroxysmal atrial fibrillation: Secondary | ICD-10-CM | POA: Diagnosis not present

## 2022-10-26 DIAGNOSIS — Z7901 Long term (current) use of anticoagulants: Secondary | ICD-10-CM | POA: Diagnosis not present

## 2022-10-26 DIAGNOSIS — R630 Anorexia: Secondary | ICD-10-CM | POA: Diagnosis not present

## 2022-10-27 DIAGNOSIS — L89150 Pressure ulcer of sacral region, unstageable: Secondary | ICD-10-CM | POA: Diagnosis not present

## 2022-11-02 DIAGNOSIS — K8020 Calculus of gallbladder without cholecystitis without obstruction: Secondary | ICD-10-CM | POA: Diagnosis not present

## 2022-11-02 DIAGNOSIS — L98499 Non-pressure chronic ulcer of skin of other sites with unspecified severity: Secondary | ICD-10-CM | POA: Diagnosis not present

## 2022-11-02 DIAGNOSIS — N390 Urinary tract infection, site not specified: Secondary | ICD-10-CM | POA: Diagnosis not present

## 2022-11-02 DIAGNOSIS — S79929A Unspecified injury of unspecified thigh, initial encounter: Secondary | ICD-10-CM | POA: Diagnosis not present

## 2022-11-02 DIAGNOSIS — I10 Essential (primary) hypertension: Secondary | ICD-10-CM | POA: Diagnosis not present

## 2022-11-02 DIAGNOSIS — N3289 Other specified disorders of bladder: Secondary | ICD-10-CM | POA: Diagnosis not present

## 2022-11-02 DIAGNOSIS — K6289 Other specified diseases of anus and rectum: Secondary | ICD-10-CM | POA: Diagnosis not present

## 2022-11-02 DIAGNOSIS — R140 Abdominal distension (gaseous): Secondary | ICD-10-CM | POA: Diagnosis not present

## 2022-11-02 DIAGNOSIS — K449 Diaphragmatic hernia without obstruction or gangrene: Secondary | ICD-10-CM | POA: Diagnosis not present

## 2022-11-02 DIAGNOSIS — K5900 Constipation, unspecified: Secondary | ICD-10-CM | POA: Diagnosis not present

## 2022-11-04 DIAGNOSIS — L98429 Non-pressure chronic ulcer of back with unspecified severity: Secondary | ICD-10-CM | POA: Diagnosis not present

## 2022-11-04 DIAGNOSIS — N390 Urinary tract infection, site not specified: Secondary | ICD-10-CM | POA: Diagnosis not present

## 2022-11-04 DIAGNOSIS — Z993 Dependence on wheelchair: Secondary | ICD-10-CM | POA: Diagnosis not present

## 2022-11-04 DIAGNOSIS — I10 Essential (primary) hypertension: Secondary | ICD-10-CM | POA: Diagnosis not present

## 2022-11-04 DIAGNOSIS — Z66 Do not resuscitate: Secondary | ICD-10-CM | POA: Diagnosis not present

## 2022-11-04 DIAGNOSIS — K8020 Calculus of gallbladder without cholecystitis without obstruction: Secondary | ICD-10-CM | POA: Diagnosis not present

## 2022-11-04 DIAGNOSIS — L89103 Pressure ulcer of unspecified part of back, stage 3: Secondary | ICD-10-CM | POA: Diagnosis not present

## 2022-11-04 DIAGNOSIS — Z7901 Long term (current) use of anticoagulants: Secondary | ICD-10-CM | POA: Diagnosis not present

## 2022-11-04 DIAGNOSIS — I2510 Atherosclerotic heart disease of native coronary artery without angina pectoris: Secondary | ICD-10-CM | POA: Diagnosis not present

## 2022-11-04 DIAGNOSIS — Z951 Presence of aortocoronary bypass graft: Secondary | ICD-10-CM | POA: Diagnosis not present

## 2022-11-04 DIAGNOSIS — Z79899 Other long term (current) drug therapy: Secondary | ICD-10-CM | POA: Diagnosis not present

## 2022-11-04 DIAGNOSIS — B9620 Unspecified Escherichia coli [E. coli] as the cause of diseases classified elsewhere: Secondary | ICD-10-CM | POA: Diagnosis not present

## 2022-11-04 DIAGNOSIS — D6869 Other thrombophilia: Secondary | ICD-10-CM | POA: Diagnosis not present

## 2022-11-04 DIAGNOSIS — N3000 Acute cystitis without hematuria: Secondary | ICD-10-CM | POA: Diagnosis not present

## 2022-11-04 DIAGNOSIS — K5289 Other specified noninfective gastroenteritis and colitis: Secondary | ICD-10-CM | POA: Diagnosis not present

## 2022-11-04 DIAGNOSIS — Z91040 Latex allergy status: Secondary | ICD-10-CM | POA: Diagnosis not present

## 2022-11-04 DIAGNOSIS — R1310 Dysphagia, unspecified: Secondary | ICD-10-CM | POA: Diagnosis not present

## 2022-11-04 DIAGNOSIS — L89150 Pressure ulcer of sacral region, unstageable: Secondary | ICD-10-CM | POA: Diagnosis not present

## 2022-11-04 DIAGNOSIS — Z7409 Other reduced mobility: Secondary | ICD-10-CM | POA: Diagnosis not present

## 2022-11-04 DIAGNOSIS — N3289 Other specified disorders of bladder: Secondary | ICD-10-CM | POA: Diagnosis not present

## 2022-11-04 DIAGNOSIS — M5416 Radiculopathy, lumbar region: Secondary | ICD-10-CM | POA: Diagnosis not present

## 2022-11-04 DIAGNOSIS — L89154 Pressure ulcer of sacral region, stage 4: Secondary | ICD-10-CM | POA: Diagnosis not present

## 2022-11-04 DIAGNOSIS — E782 Mixed hyperlipidemia: Secondary | ICD-10-CM | POA: Diagnosis not present

## 2022-11-04 DIAGNOSIS — R69 Illness, unspecified: Secondary | ICD-10-CM | POA: Diagnosis not present

## 2022-11-04 DIAGNOSIS — S31000A Unspecified open wound of lower back and pelvis without penetration into retroperitoneum, initial encounter: Secondary | ICD-10-CM | POA: Diagnosis not present

## 2022-11-04 DIAGNOSIS — M5430 Sciatica, unspecified side: Secondary | ICD-10-CM | POA: Diagnosis not present

## 2022-11-04 DIAGNOSIS — R339 Retention of urine, unspecified: Secondary | ICD-10-CM | POA: Diagnosis not present

## 2022-11-04 DIAGNOSIS — L8990 Pressure ulcer of unspecified site, unspecified stage: Secondary | ICD-10-CM | POA: Diagnosis not present

## 2022-11-04 DIAGNOSIS — I480 Paroxysmal atrial fibrillation: Secondary | ICD-10-CM | POA: Diagnosis not present

## 2022-11-04 DIAGNOSIS — K5900 Constipation, unspecified: Secondary | ICD-10-CM | POA: Diagnosis not present

## 2022-11-10 DIAGNOSIS — F918 Other conduct disorders: Secondary | ICD-10-CM | POA: Diagnosis not present

## 2022-11-10 DIAGNOSIS — L8990 Pressure ulcer of unspecified site, unspecified stage: Secondary | ICD-10-CM | POA: Diagnosis not present

## 2022-11-10 DIAGNOSIS — Z743 Need for continuous supervision: Secondary | ICD-10-CM | POA: Diagnosis not present

## 2022-11-10 DIAGNOSIS — R69 Illness, unspecified: Secondary | ICD-10-CM | POA: Diagnosis not present

## 2022-11-11 DIAGNOSIS — S72002D Fracture of unspecified part of neck of left femur, subsequent encounter for closed fracture with routine healing: Secondary | ICD-10-CM | POA: Diagnosis not present

## 2022-11-11 DIAGNOSIS — K5909 Other constipation: Secondary | ICD-10-CM | POA: Diagnosis not present

## 2022-11-11 DIAGNOSIS — E782 Mixed hyperlipidemia: Secondary | ICD-10-CM | POA: Diagnosis not present

## 2022-11-11 DIAGNOSIS — R799 Abnormal finding of blood chemistry, unspecified: Secondary | ICD-10-CM | POA: Diagnosis not present

## 2022-11-11 DIAGNOSIS — I480 Paroxysmal atrial fibrillation: Secondary | ICD-10-CM | POA: Diagnosis not present

## 2022-11-11 DIAGNOSIS — L89150 Pressure ulcer of sacral region, unstageable: Secondary | ICD-10-CM | POA: Diagnosis not present

## 2022-11-11 DIAGNOSIS — R69 Illness, unspecified: Secondary | ICD-10-CM | POA: Diagnosis not present

## 2022-11-12 DIAGNOSIS — N390 Urinary tract infection, site not specified: Secondary | ICD-10-CM | POA: Diagnosis not present

## 2022-11-17 DIAGNOSIS — R1312 Dysphagia, oropharyngeal phase: Secondary | ICD-10-CM | POA: Diagnosis not present

## 2022-11-17 DIAGNOSIS — M6281 Muscle weakness (generalized): Secondary | ICD-10-CM | POA: Diagnosis not present

## 2022-11-17 DIAGNOSIS — R293 Abnormal posture: Secondary | ICD-10-CM | POA: Diagnosis not present

## 2022-11-17 DIAGNOSIS — S72142D Displaced intertrochanteric fracture of left femur, subsequent encounter for closed fracture with routine healing: Secondary | ICD-10-CM | POA: Diagnosis not present

## 2022-11-17 DIAGNOSIS — R41841 Cognitive communication deficit: Secondary | ICD-10-CM | POA: Diagnosis not present

## 2022-11-17 DIAGNOSIS — R278 Other lack of coordination: Secondary | ICD-10-CM | POA: Diagnosis not present

## 2022-11-19 DIAGNOSIS — Z9889 Other specified postprocedural states: Secondary | ICD-10-CM | POA: Diagnosis not present

## 2022-11-19 DIAGNOSIS — L89154 Pressure ulcer of sacral region, stage 4: Secondary | ICD-10-CM | POA: Diagnosis not present

## 2022-11-20 DIAGNOSIS — R1312 Dysphagia, oropharyngeal phase: Secondary | ICD-10-CM | POA: Diagnosis not present

## 2022-11-20 DIAGNOSIS — R293 Abnormal posture: Secondary | ICD-10-CM | POA: Diagnosis not present

## 2022-11-20 DIAGNOSIS — M6281 Muscle weakness (generalized): Secondary | ICD-10-CM | POA: Diagnosis not present

## 2022-11-20 DIAGNOSIS — R41841 Cognitive communication deficit: Secondary | ICD-10-CM | POA: Diagnosis not present

## 2022-11-20 DIAGNOSIS — S72142D Displaced intertrochanteric fracture of left femur, subsequent encounter for closed fracture with routine healing: Secondary | ICD-10-CM | POA: Diagnosis not present

## 2022-11-20 DIAGNOSIS — R278 Other lack of coordination: Secondary | ICD-10-CM | POA: Diagnosis not present

## 2022-11-21 DIAGNOSIS — Z0189 Encounter for other specified special examinations: Secondary | ICD-10-CM | POA: Diagnosis not present

## 2022-11-23 DIAGNOSIS — R1312 Dysphagia, oropharyngeal phase: Secondary | ICD-10-CM | POA: Diagnosis not present

## 2022-11-23 DIAGNOSIS — R278 Other lack of coordination: Secondary | ICD-10-CM | POA: Diagnosis not present

## 2022-11-23 DIAGNOSIS — M6281 Muscle weakness (generalized): Secondary | ICD-10-CM | POA: Diagnosis not present

## 2022-11-23 DIAGNOSIS — R41841 Cognitive communication deficit: Secondary | ICD-10-CM | POA: Diagnosis not present

## 2022-11-23 DIAGNOSIS — R293 Abnormal posture: Secondary | ICD-10-CM | POA: Diagnosis not present

## 2022-11-23 DIAGNOSIS — S72142D Displaced intertrochanteric fracture of left femur, subsequent encounter for closed fracture with routine healing: Secondary | ICD-10-CM | POA: Diagnosis not present

## 2022-11-24 DIAGNOSIS — R1312 Dysphagia, oropharyngeal phase: Secondary | ICD-10-CM | POA: Diagnosis not present

## 2022-11-24 DIAGNOSIS — M6281 Muscle weakness (generalized): Secondary | ICD-10-CM | POA: Diagnosis not present

## 2022-11-24 DIAGNOSIS — S72142D Displaced intertrochanteric fracture of left femur, subsequent encounter for closed fracture with routine healing: Secondary | ICD-10-CM | POA: Diagnosis not present

## 2022-11-24 DIAGNOSIS — R278 Other lack of coordination: Secondary | ICD-10-CM | POA: Diagnosis not present

## 2022-11-24 DIAGNOSIS — R41841 Cognitive communication deficit: Secondary | ICD-10-CM | POA: Diagnosis not present

## 2022-11-24 DIAGNOSIS — R293 Abnormal posture: Secondary | ICD-10-CM | POA: Diagnosis not present

## 2022-11-24 DIAGNOSIS — Z6821 Body mass index (BMI) 21.0-21.9, adult: Secondary | ICD-10-CM | POA: Diagnosis not present

## 2022-11-24 DIAGNOSIS — R634 Abnormal weight loss: Secondary | ICD-10-CM | POA: Diagnosis not present

## 2022-11-25 DIAGNOSIS — R1312 Dysphagia, oropharyngeal phase: Secondary | ICD-10-CM | POA: Diagnosis not present

## 2022-11-25 DIAGNOSIS — R278 Other lack of coordination: Secondary | ICD-10-CM | POA: Diagnosis not present

## 2022-11-25 DIAGNOSIS — Z0189 Encounter for other specified special examinations: Secondary | ICD-10-CM | POA: Diagnosis not present

## 2022-11-25 DIAGNOSIS — M6281 Muscle weakness (generalized): Secondary | ICD-10-CM | POA: Diagnosis not present

## 2022-11-25 DIAGNOSIS — R293 Abnormal posture: Secondary | ICD-10-CM | POA: Diagnosis not present

## 2022-11-25 DIAGNOSIS — R41841 Cognitive communication deficit: Secondary | ICD-10-CM | POA: Diagnosis not present

## 2022-11-25 DIAGNOSIS — S72142D Displaced intertrochanteric fracture of left femur, subsequent encounter for closed fracture with routine healing: Secondary | ICD-10-CM | POA: Diagnosis not present

## 2022-11-26 DIAGNOSIS — R278 Other lack of coordination: Secondary | ICD-10-CM | POA: Diagnosis not present

## 2022-11-26 DIAGNOSIS — R41841 Cognitive communication deficit: Secondary | ICD-10-CM | POA: Diagnosis not present

## 2022-11-26 DIAGNOSIS — M6281 Muscle weakness (generalized): Secondary | ICD-10-CM | POA: Diagnosis not present

## 2022-11-26 DIAGNOSIS — S72142D Displaced intertrochanteric fracture of left femur, subsequent encounter for closed fracture with routine healing: Secondary | ICD-10-CM | POA: Diagnosis not present

## 2022-11-26 DIAGNOSIS — R1312 Dysphagia, oropharyngeal phase: Secondary | ICD-10-CM | POA: Diagnosis not present

## 2022-11-26 DIAGNOSIS — R293 Abnormal posture: Secondary | ICD-10-CM | POA: Diagnosis not present

## 2022-11-27 DIAGNOSIS — R278 Other lack of coordination: Secondary | ICD-10-CM | POA: Diagnosis not present

## 2022-11-27 DIAGNOSIS — M6281 Muscle weakness (generalized): Secondary | ICD-10-CM | POA: Diagnosis not present

## 2022-11-27 DIAGNOSIS — R1312 Dysphagia, oropharyngeal phase: Secondary | ICD-10-CM | POA: Diagnosis not present

## 2022-11-27 DIAGNOSIS — S72142D Displaced intertrochanteric fracture of left femur, subsequent encounter for closed fracture with routine healing: Secondary | ICD-10-CM | POA: Diagnosis not present

## 2022-11-27 DIAGNOSIS — R41841 Cognitive communication deficit: Secondary | ICD-10-CM | POA: Diagnosis not present

## 2022-11-27 DIAGNOSIS — R293 Abnormal posture: Secondary | ICD-10-CM | POA: Diagnosis not present

## 2022-12-01 DIAGNOSIS — E876 Hypokalemia: Secondary | ICD-10-CM | POA: Diagnosis not present

## 2022-12-02 DIAGNOSIS — R41841 Cognitive communication deficit: Secondary | ICD-10-CM | POA: Diagnosis not present

## 2022-12-02 DIAGNOSIS — R1312 Dysphagia, oropharyngeal phase: Secondary | ICD-10-CM | POA: Diagnosis not present

## 2022-12-02 DIAGNOSIS — I480 Paroxysmal atrial fibrillation: Secondary | ICD-10-CM | POA: Diagnosis not present

## 2022-12-02 DIAGNOSIS — R278 Other lack of coordination: Secondary | ICD-10-CM | POA: Diagnosis not present

## 2022-12-02 DIAGNOSIS — S72142D Displaced intertrochanteric fracture of left femur, subsequent encounter for closed fracture with routine healing: Secondary | ICD-10-CM | POA: Diagnosis not present

## 2022-12-02 DIAGNOSIS — R293 Abnormal posture: Secondary | ICD-10-CM | POA: Diagnosis not present

## 2022-12-02 DIAGNOSIS — I10 Essential (primary) hypertension: Secondary | ICD-10-CM | POA: Diagnosis not present

## 2022-12-02 DIAGNOSIS — L89150 Pressure ulcer of sacral region, unstageable: Secondary | ICD-10-CM | POA: Diagnosis not present

## 2022-12-02 DIAGNOSIS — M6281 Muscle weakness (generalized): Secondary | ICD-10-CM | POA: Diagnosis not present

## 2022-12-03 DIAGNOSIS — Z09 Encounter for follow-up examination after completed treatment for conditions other than malignant neoplasm: Secondary | ICD-10-CM | POA: Diagnosis not present

## 2022-12-03 DIAGNOSIS — R293 Abnormal posture: Secondary | ICD-10-CM | POA: Diagnosis not present

## 2022-12-03 DIAGNOSIS — M6281 Muscle weakness (generalized): Secondary | ICD-10-CM | POA: Diagnosis not present

## 2022-12-03 DIAGNOSIS — R41841 Cognitive communication deficit: Secondary | ICD-10-CM | POA: Diagnosis not present

## 2022-12-03 DIAGNOSIS — R1312 Dysphagia, oropharyngeal phase: Secondary | ICD-10-CM | POA: Diagnosis not present

## 2022-12-03 DIAGNOSIS — S72142D Displaced intertrochanteric fracture of left femur, subsequent encounter for closed fracture with routine healing: Secondary | ICD-10-CM | POA: Diagnosis not present

## 2022-12-03 DIAGNOSIS — R278 Other lack of coordination: Secondary | ICD-10-CM | POA: Diagnosis not present

## 2022-12-03 DIAGNOSIS — Z5189 Encounter for other specified aftercare: Secondary | ICD-10-CM | POA: Diagnosis not present

## 2022-12-04 DIAGNOSIS — R293 Abnormal posture: Secondary | ICD-10-CM | POA: Diagnosis not present

## 2022-12-04 DIAGNOSIS — R278 Other lack of coordination: Secondary | ICD-10-CM | POA: Diagnosis not present

## 2022-12-04 DIAGNOSIS — S72142D Displaced intertrochanteric fracture of left femur, subsequent encounter for closed fracture with routine healing: Secondary | ICD-10-CM | POA: Diagnosis not present

## 2022-12-04 DIAGNOSIS — M6281 Muscle weakness (generalized): Secondary | ICD-10-CM | POA: Diagnosis not present

## 2022-12-04 DIAGNOSIS — R1312 Dysphagia, oropharyngeal phase: Secondary | ICD-10-CM | POA: Diagnosis not present

## 2022-12-04 DIAGNOSIS — R41841 Cognitive communication deficit: Secondary | ICD-10-CM | POA: Diagnosis not present

## 2022-12-05 DIAGNOSIS — R1312 Dysphagia, oropharyngeal phase: Secondary | ICD-10-CM | POA: Diagnosis not present

## 2022-12-05 DIAGNOSIS — R293 Abnormal posture: Secondary | ICD-10-CM | POA: Diagnosis not present

## 2022-12-05 DIAGNOSIS — S72142D Displaced intertrochanteric fracture of left femur, subsequent encounter for closed fracture with routine healing: Secondary | ICD-10-CM | POA: Diagnosis not present

## 2022-12-05 DIAGNOSIS — M6281 Muscle weakness (generalized): Secondary | ICD-10-CM | POA: Diagnosis not present

## 2022-12-05 DIAGNOSIS — R41841 Cognitive communication deficit: Secondary | ICD-10-CM | POA: Diagnosis not present

## 2022-12-05 DIAGNOSIS — R278 Other lack of coordination: Secondary | ICD-10-CM | POA: Diagnosis not present

## 2022-12-06 DIAGNOSIS — M6281 Muscle weakness (generalized): Secondary | ICD-10-CM | POA: Diagnosis not present

## 2022-12-06 DIAGNOSIS — R293 Abnormal posture: Secondary | ICD-10-CM | POA: Diagnosis not present

## 2022-12-06 DIAGNOSIS — R278 Other lack of coordination: Secondary | ICD-10-CM | POA: Diagnosis not present

## 2022-12-06 DIAGNOSIS — S72142D Displaced intertrochanteric fracture of left femur, subsequent encounter for closed fracture with routine healing: Secondary | ICD-10-CM | POA: Diagnosis not present

## 2022-12-06 DIAGNOSIS — R1312 Dysphagia, oropharyngeal phase: Secondary | ICD-10-CM | POA: Diagnosis not present

## 2022-12-06 DIAGNOSIS — R41841 Cognitive communication deficit: Secondary | ICD-10-CM | POA: Diagnosis not present

## 2022-12-07 IMAGING — RF DG CERVICAL SPINE 1V
1 series · 1 of 1 positions shown · non-contrast
Comparison: Cervical spine MRI 12/27/2020.

CLINICAL DATA: Surgery, elective. Additional history provided: C4-6
ACDF. Provided fluoroscopy time 7 seconds (0.43 mGy).

EXAM:
DG CERVICAL SPINE - 1 VIEW; DG C-ARM 1-60 MIN

[Series 1: run · 1 of 1 slices shown]
[im 1/1]
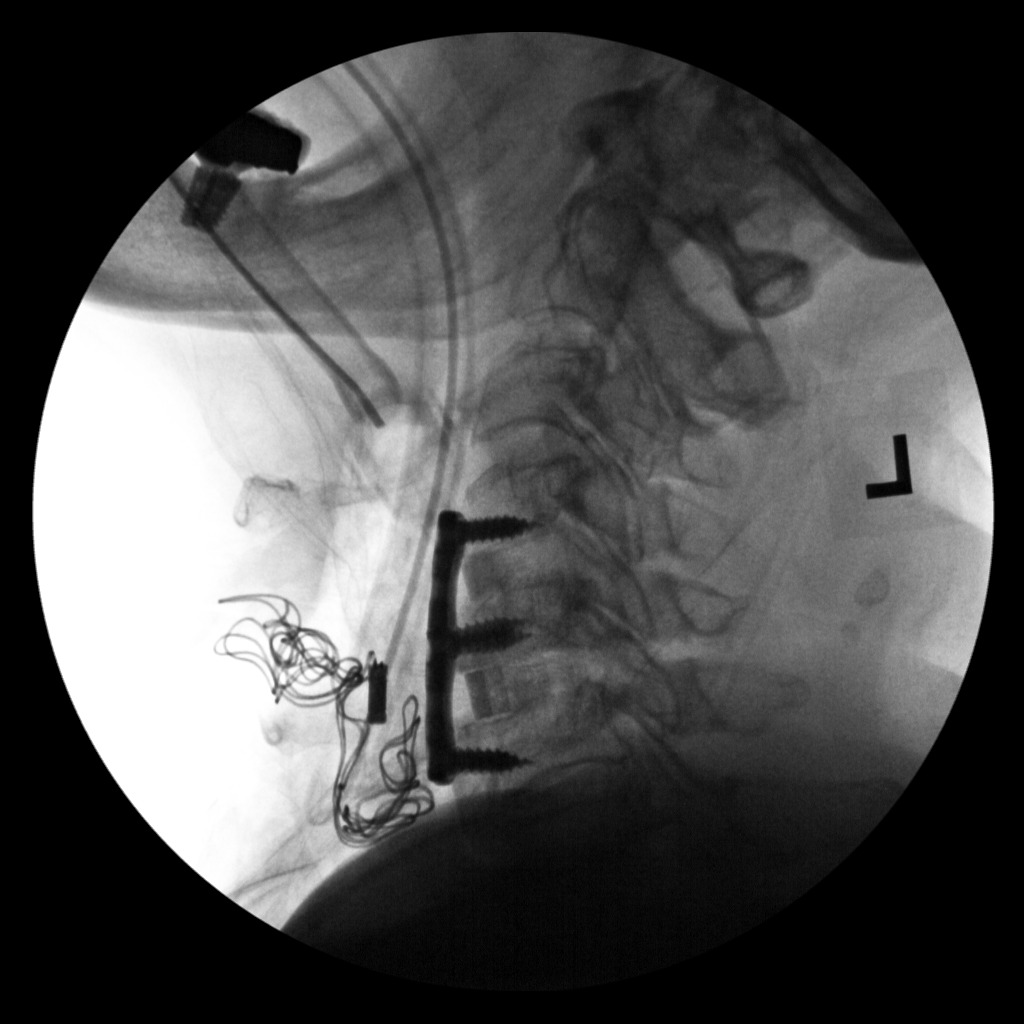

[1 of 1 positions shown; findings below may reference images not displayed]

FINDINGS: A single lateral view intraoperative fluoroscopic image of the
cervical spine is submitted. On the provided image, ACDF hardware is
present at the C4-C6 levels (ventral plate and screws). Interbody
devices are also present at C4-C5 and C5-C6. Curvilinear
hyperdensity projects ventral to the cervical spine at the C5-C7
levels, likely reflecting a surgical sponge/packing material.
Partially visualized ET tube.
IMPRESSION: Single lateral view intraoperative fluoroscopic image of the
cervical spine from C4-C6 ACDF, as described.

Curvilinear hyperdensity projects ventral to the cervical spine at
the C5-C7 levels, likely reflecting a surgical sponge/packing
material. Correlate with the operative history.

## 2023-01-20 ENCOUNTER — Encounter: Payer: Medicare HMO | Admitting: Psychology

## 2023-01-27 ENCOUNTER — Encounter: Payer: Self-pay | Admitting: Neurology

## 2023-01-27 ENCOUNTER — Encounter: Payer: Medicare HMO | Admitting: Psychology

## 2023-02-03 NOTE — Progress Notes (Signed)
Patient not seen.  No charge, no penalty.    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 Cervical myelopathy s/p fusion     1  PET scan to differentiate hypometabolic pattern between Lewy Body dementia and Alzheimer's 2  Supportive care.  Defer starting a different cholinesterase inhibitor for now. 3  Follow up 6 months.   Subjective:  [name] is a 77 year old right-handed female with  Recently diagnosed noninvasive breast cancer who follows up for postconcussion syndrome and myelopathy.  She is accompanied by her sonwho supplements history.     UPDATE: PET scan of brain on 09/06/2022 was normal.     She fell and fractured her femur in October.  Since then, she has been in the hospital with complications including osteomyelitis and sepsis.  She has been diagnosed with "failure to thrive" and for the most part remains in bed.  She had been living in a skilled nursing facility and has entered hospice care.     HISTORY: On 06/30/2020, she tripped and fell in the bathroom at church, hitting her head on the bathroom stall sustaining a scalp laceration.  She was seen in Urgent Care where she and her spouse reported that she did not lose consciousness and denied headache, blurred vision, nausea, vomiting, dizziness or lightheadedness.  When she returned to Urgent Care on 07/10/2020 for suture removal, she still denied headache. However, she has reported lightheadedness and feeling dizzy particularly in changing positions.  She is therefore scared to drive.  She reports problems with memory, having to write things down.  She reports word-finding issues and speech is slurred.  She also has trouble writing.  Handwriting is smaller.  It takes prolonged period of time to write things down.  She is still able to pay the bills but takes a while to complete task.  She also cries easily.  She has been using a cane as she has been  unsteady. There has been no improvement in symptoms but they have not gotten worse either.  MRI of brain with and without contrast on 09/06/2020 showed mild generalized cerebral atrophy and chronic small vessel ischemic changes but no acute intracranial abnormality.  Noted to have UMN signs on exam, such as hyperreflexia, Hoffman and Babinski signs.  MRI of cervical spine with and without contrast on 12/27/2020 personally reviewed and showed cord compression at C4-5 and C5-6.  She was referred to neurosurgery and underwent ACDF at those levels in May 2022.  She is in rehab.  She is doing much better.  They will be working on balance next.  Speech has cleared up.  Cognition improved.  She may need a moment to gather her thoughts of what she wants to say.  Emotional lability has improved.    Underwent neuropsychological evaluation with Dr. Melvyn Novas on 07/06/2022.  She was found to have mild-moderate state major neurocognitive disorder.  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     She hit her head after tripping on the driveway in [id number] but no concussion or LOC with that event  Past medications:  donepezil (hallucinations)

## 2023-02-04 ENCOUNTER — Telehealth: Payer: Medicare Other | Admitting: Neurology

## 2023-02-04 DIAGNOSIS — R4189 Other symptoms and signs involving cognitive functions and awareness: Secondary | ICD-10-CM

## 2023-02-11 NOTE — Progress Notes (Signed)
Virtual Visit via Video Note  Consent was obtained for video visit:  Yes.   Answered questions that patient had about telehealth interaction:  Yes.   I discussed the limitations, risks, security and privacy concerns of performing an evaluation and management service by telemedicine. I also discussed with the patient that there may be a patient responsible charge related to this service. The patient expressed understanding and agreed to proceed.  Pt location: Home Physician Location: office Name of referring provider:  Orpah Melter, MD I connected with [name] at patients initiation/request on 02/12/2023 at  76:10 PM EST by video enabled telemedicine application and verified that I am speaking with the correct person using two identifiers. 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  Clinically declined since hip fracture and recurrent osteomyelitis/infections.   1  Continue supportive management  2  Follow up 6 months.   Subjective:  [name] is a 77 year old right-handed female with  Recently diagnosed noninvasive breast cancer who follows up for postconcussion syndrome and myelopathy.  She is accompanied by her sonwho supplements history.     UPDATE: PET scan of brain on 09/06/2022 was normal.     She fell and fractured her femur in October.  Since then, she has been in the hospital with complications including osteomyelitis and sepsis.  She has been diagnosed with "failure to thrive" and for the most part remains in bed.  She had been living in a skilled nursing facility and has entered hospice care.  She is in pain.  Less hallucinations since stopping donepezil.  Some days are better than others in regards to verbal communication. She is nonambulatory.  She spends most time in bed.  She takes morphine  to help with pain.  This has helped with sleeping at night.   Sometimes when she is in pain, her body shakes, including head and upper extremities.  It would occur episodically.  Since yesterday, it has been constant.  She struggles to speak.     HISTORY: On 06/30/2020, she tripped and fell in the bathroom at church, hitting her head on the bathroom stall sustaining a scalp laceration.  She was seen in Urgent Care where she and her spouse reported that she did not lose consciousness and denied headache, blurred vision, nausea, vomiting, dizziness or lightheadedness.  When she returned to Urgent Care on 07/10/2020 for suture removal, she still denied headache. However, she has reported lightheadedness and feeling dizzy particularly in changing positions.  She is therefore scared to drive.  She reports problems with memory, having to write things down.  She reports word-finding issues and speech is slurred.  She also has trouble writing.  Handwriting is smaller.  It takes prolonged period of time to write things down.  She is still able to pay the bills but takes a while to complete task.  She also cries easily.  She has been using a cane as she has been unsteady. There has been no improvement in symptoms but they have not gotten worse either.  MRI of brain with and without contrast on 09/06/2020 showed mild generalized cerebral atrophy and chronic small vessel ischemic changes but no acute intracranial abnormality.  Noted to have UMN signs on exam, such as hyperreflexia, Hoffman and Babinski signs.  MRI of cervical spine with and  without contrast on 12/27/2020 personally reviewed and showed cord compression at C4-5 and C5-6.  She was referred to neurosurgery and underwent ACDF at those levels in May 2022.  She is in rehab.  She is doing much better.  They will be working on balance next.  Speech has cleared up.  Cognition improved.  She may need a moment to gather her thoughts of what she wants to say.  Emotional  lability has improved.    Underwent neuropsychological evaluation with Dr. Melvyn Novas on 07/06/2022.  She was found to have mild-moderate state major neurocognitive disorder.  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     She hit her head after tripping on the driveway in [id number] but no concussion or LOC with that event  Past medications:  donepezil (hallucinations)  Past Medical History: Past Medical History:  Diagnosis Date   Allergic rhinitis    Cervical myelopathy 04/23/2021   Cervical spondylosis    bulging discs   Coronary artery disease    s/p CAGB 01/2011- LIMA to LAD patent, all other vein grafts occluded. Circumflex DES 3/13 placed following lateral ischemia on nuclear stress test   Coronary atherosclerosis of native coronary artery 02/05/2012   CAD - CABG 2/12 - LIMA-LAD, S-D, S-OM, S-PDA [facility]an Hockey) NUC - 2/13 - lateral ischemia CATH- 02/05/12 - all 3 SVG grafts were down. LIMA patent. Successful PCI of the left circumflex with overlapping Promus drug-eluting stents, 2.5 x 38 and 2.75 x 16. The stented area was postdilated to greater than 3 mm in the mid and proximal stented portion. 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  The report date is November 30, 2020.   The Common Hereditary Cancers Panel offered by [facility] includes sequencing and/or deletion duplication testing of the following 48 genes: APC, ATM, AXIN2, BARD1, BMPR1A, BRCA1, BRCA2, BRIP1, CDH1, CDK4, CDKN2A (p14ARF), CDKN2A (p16INK4a), CHEK2, CTNNA1, DIC    Hypertensive retinopathy    Mild dementia 07/06/2022   Mixed hyperlipidemia    Mixed incontinence    Obesity    Paroxysmal atrial fibrillation 05/15/2021   Post concussion syndrome    Postmenopausal    Prediabetes    Sciatica    Secondary hypercoagulable state 05/15/2021    Medications: Outpatient Encounter Medications as of 02/12/2023  Medication Sig   acetaminophen (TYLENOL) 500 MG tablet Take 500-1,000 mg by mouth every 6 (six) hours as needed (pain).   apixaban (ELIQUIS) 5 MG TABS tablet Take 1 tablet (5 mg total) by mouth 2 (two) times daily.   atenolol (TENORMIN) 50 MG tablet Take 1 tablet (50 mg total) by mouth daily.   B Complex-C-E-Zn (EQL STRESS B-COMPLEX C/ZINC) TABS take 1 tablet by oral route every day (Patient not taking: Reported on 07/31/2022)   cephALEXin (KEFLEX) 500 MG capsule Take 500 mg by mouth 4 (four) times daily. 3x/day for 7 days (Patient not taking: Reported on 04/22/2022)   COVID-19 mRNA Vac-TriS, Pfizer, SUSP injection Inject into the muscle. (Patient not taking: Reported on 07/31/2022)   furosemide (LASIX) 20 MG tablet  TAKE 1 TABLET BY MOUTH EVERY DAY   ibuprofen (ADVIL) 200 MG tablet 1 tablet with food or milk as needed   ketoconazole (NIZORAL) 2 % cream Apply 1 application topically daily. (Patient not taking: Reported on 07/31/2022)   Multiple Vitamin (MULTIVITAMIN WITH MINERALS) TABS tablet Take 1 tablet by mouth daily.   Potassium Gluconate 550 MG TABS take 1 tablet by oral route every day (Patient not taking: Reported on 07/31/2022)   rosuvastatin (CRESTOR) 20 MG tablet TAKE 1 TABLET BY MOUTH EVERY DAY   No facility-administered encounter medications on file as of 02/12/2023.    Allergies: Allergies  Allergen Reactions   Norvasc [Amlodipine Besylate] Other (See Comments)    Edema    Latex Itching and Rash   Lipitor [Atorvastatin] Other (See Comments)    Muscle aches   Lisinopril Swelling and Cough   Penicillins Rash    Tolerated Ancef 2g IV on  02/19/21   Sulfa Antibiotics Rash   Tape Itching and Rash   Tetanus Toxoids Other (See Comments)    Patient said "Didn't do well".    Family History: Family History  Problem Relation Age of Onset   Heart disease Mother    Hypertension Mother    Heart attack Father    Alcoholism Father    Heart disease Brother    Heart disease Brother    Breast cancer Paternal Grandmother        dx unknown age   ADD / ADHD Son    ADD / ADHD Son    Breast cancer Paternal Aunt        dx unknown age    Observations/Objective:   No acute distress.  Alert and oriented.  Speech fluent and not dysarthric.  Language intact.  Eyes orthophoric on primary gaze.  Face symmetric.   Follow Up Instructions:    -I discussed the assessment and treatment plan with the patient. The patient was provided an opportunity to ask questions and all were answered. The patient agreed with the plan and demonstrated an understanding of the instructions.   The patient was advised to call back or seek an in-person evaluation if the symptoms worsen or if the condition fails to improve as anticipated.   Dudley Major, DO

## 2023-02-12 ENCOUNTER — Telehealth: Admitting: Neurology

## 2023-02-12 ENCOUNTER — Encounter: Payer: Self-pay | Admitting: Neurology

## 2023-02-12 DIAGNOSIS — R419 Unspecified symptoms and signs involving cognitive functions and awareness: Secondary | ICD-10-CM

## 2023-02-26 ENCOUNTER — Telehealth: Payer: Self-pay | Admitting: Neurology

## 2023-02-26 NOTE — Telephone Encounter (Signed)
Pt's son called in to inform us that the patient passed away on March 01, 2023

## 2023-03-08 DEATH — deceased

## 2023-07-14 ENCOUNTER — Encounter: Payer: Medicare HMO | Admitting: Psychology

## 2023-07-21 ENCOUNTER — Encounter: Payer: Medicare HMO | Admitting: Psychology

## 2023-08-16 ENCOUNTER — Telehealth: Payer: Medicare Other | Admitting: Neurology
